# Patient Record
Sex: Female | Born: 2000 | Race: White | Hispanic: No | Marital: Single | Smoking: Current every day smoker
Health system: Southern US, Community
[De-identification: ages and names within clinical notes are randomized; demographics above are authoritative.]

## PROBLEM LIST (undated history)

## (undated) DIAGNOSIS — O139 Gestational [pregnancy-induced] hypertension without significant proteinuria, unspecified trimester: Secondary | ICD-10-CM

## (undated) DIAGNOSIS — O1490 Unspecified pre-eclampsia, unspecified trimester: Secondary | ICD-10-CM

## (undated) DIAGNOSIS — Z789 Other specified health status: Secondary | ICD-10-CM

## (undated) DIAGNOSIS — I10 Essential (primary) hypertension: Secondary | ICD-10-CM

## (undated) DIAGNOSIS — R011 Cardiac murmur, unspecified: Secondary | ICD-10-CM

## (undated) HISTORY — DX: Gestational (pregnancy-induced) hypertension without significant proteinuria, unspecified trimester: O13.9

## (undated) HISTORY — DX: Essential (primary) hypertension: I10

## (undated) HISTORY — PX: NO PAST SURGERIES: SHX2092

## (undated) HISTORY — DX: Other specified health status: Z78.9

---
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
## 2019-04-21 ENCOUNTER — Ambulatory Visit: Payer: BC Managed Care – PPO | Admitting: Adult Health

## 2019-04-28 ENCOUNTER — Telehealth: Payer: Self-pay | Admitting: Adult Health

## 2019-04-28 NOTE — Telephone Encounter (Signed)

## 2019-05-03 ENCOUNTER — Ambulatory Visit (INDEPENDENT_AMBULATORY_CARE_PROVIDER_SITE_OTHER): Payer: BC Managed Care – PPO | Admitting: Adult Health

## 2019-05-03 ENCOUNTER — Ambulatory Visit: Payer: BC Managed Care – PPO | Admitting: Adult Health

## 2019-05-03 ENCOUNTER — Other Ambulatory Visit: Payer: Self-pay

## 2019-05-03 ENCOUNTER — Encounter: Payer: Self-pay | Admitting: Adult Health

## 2019-05-03 VITALS — BP 123/83 | HR 68 | Ht 66.0 in | Wt 112.5 lb

## 2019-05-03 DIAGNOSIS — Z3009 Encounter for other general counseling and advice on contraception: Secondary | ICD-10-CM | POA: Diagnosis not present

## 2019-05-03 DIAGNOSIS — Z3202 Encounter for pregnancy test, result negative: Secondary | ICD-10-CM

## 2019-05-03 HISTORY — DX: Encounter for other general counseling and advice on contraception: Z30.09

## 2019-05-03 LAB — POCT URINE PREGNANCY: Preg Test, Ur: NEGATIVE

## 2019-05-03 NOTE — Progress Notes (Signed)
  Subjective:     Patient ID: Sarah Campbell, female   DOB: [birth year], 19 y.o.   MRN: [id number]  HPI Sarah Campbell is a 19 year old white female,single, G0P0 in to discuss birth control options PCP is [facility].   Review of Systems Stopped loestrin had clots and painful period, was in [place] a while and pills change while there.  Reviewed past medical,surgical, social and family history. Reviewed medications and allergies.     Objective:   Physical Exam  BP 123/83 (BP Location: Left Arm, Patient Position: Sitting, Cuff Size: Normal)   Pulse 68   Ht 5\' 6"  (1.676 m)   Wt 112 lb 8 oz (51 kg)   LMP 04/18/2019   BMI 18.16 kg/m UPT is negative Skin warm and dry. Lungs: clear to ausculation bilaterally. Cardiovascular: regular rate and rhythm.    Assessment:     1. Pregnancy examination or test, negative result  2. General counseling and advice on contraceptive management Discussed birth control options and she wants to try nexplanon ,she is aware of possible side effects Review handout on nexplanon No sex Return 4/23 for inxpalnon insertion, while on period     Plan:     Return 4/23 for nexplanon insertion  No sex

## 2020-03-07 ENCOUNTER — Encounter: Payer: Self-pay | Admitting: Adult Health

## 2020-03-07 ENCOUNTER — Ambulatory Visit (INDEPENDENT_AMBULATORY_CARE_PROVIDER_SITE_OTHER): Payer: BC Managed Care – PPO | Admitting: Adult Health

## 2020-03-07 ENCOUNTER — Other Ambulatory Visit: Payer: Self-pay

## 2020-03-07 ENCOUNTER — Other Ambulatory Visit (HOSPITAL_COMMUNITY)
Admission: RE | Admit: 2020-03-07 | Discharge: 2020-03-07 | Disposition: A | Discharge status: Home / Self Care | Payer: BC Managed Care – PPO | Source: Ambulatory Visit | Attending: Adult Health | Admitting: Adult Health

## 2020-03-07 VITALS — BP 117/76 | HR 81 | Ht 66.0 in | Wt 120.0 lb

## 2020-03-07 DIAGNOSIS — Z113 Encounter for screening for infections with a predominantly sexual mode of transmission: Secondary | ICD-10-CM | POA: Insufficient documentation

## 2020-03-07 DIAGNOSIS — Z30013 Encounter for initial prescription of injectable contraceptive: Secondary | ICD-10-CM | POA: Diagnosis not present

## 2020-03-07 DIAGNOSIS — Z3202 Encounter for pregnancy test, result negative: Secondary | ICD-10-CM | POA: Diagnosis not present

## 2020-03-07 HISTORY — DX: Encounter for screening for infections with a predominantly sexual mode of transmission: Z11.3

## 2020-03-07 LAB — POCT URINE PREGNANCY: Preg Test, Ur: NEGATIVE

## 2020-03-07 MED ORDER — MEDROXYPROGESTERONE ACETATE 150 MG/ML IM SUSP: 150.0000 mg | INTRAMUSCULAR | 4 refills | Status: DC

## 2020-03-07 MED ORDER — MEDROXYPROGESTERONE ACETATE 150 MG/ML IM SUSP
150.0000 mg | Freq: Once | INTRAMUSCULAR | Status: AC
  Administered 2020-03-07: 150 mg via INTRAMUSCULAR
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
## 2020-03-08 ENCOUNTER — Telehealth: Payer: Self-pay | Admitting: Family Medicine

## 2020-03-08 ENCOUNTER — Telehealth: Payer: Self-pay | Admitting: *Deleted

## 2020-03-08 LAB — CERVICOVAGINAL ANCILLARY ONLY
Bacterial Vaginitis (gardnerella): POSITIVE — AB
Candida Glabrata: NEGATIVE
Candida Vaginitis: NEGATIVE
Chlamydia: NEGATIVE
Comment: NEGATIVE
Comment: NEGATIVE
Comment: NEGATIVE
Comment: NEGATIVE
Comment: NEGATIVE
Comment: NORMAL
Neisseria Gonorrhea: NEGATIVE
Trichomonas: NEGATIVE

## 2020-03-08 LAB — HEPATITIS C ANTIBODY: Hep C Virus Ab: <0.1 s/co ratio (ref 0.0–0.9)

## 2020-03-08 LAB — RPR: RPR Ser Ql: NONREACTIVE

## 2020-03-08 LAB — HIV ANTIBODY (ROUTINE TESTING W REFLEX): HIV Screen 4th Generation wRfx: NONREACTIVE

## 2020-03-08 NOTE — Telephone Encounter (Signed)
Pt received pfizer vaccine 1st dose 01-18-2020 and 2nd dose on 03-02-2020. Pt does not want to sch for booster

## 2020-03-08 NOTE — Telephone Encounter (Signed)
-----   Message from [name], NP sent at 03/08/2020  8:40 AM EST ----- Let her know labs negative... THX

## 2020-03-08 NOTE — Telephone Encounter (Signed)
Pt aware labs were negative. Pt voiced understanding. JSY

## 2020-03-09 ENCOUNTER — Telehealth: Payer: Self-pay | Admitting: Adult Health

## 2020-03-09 MED ORDER — METRONIDAZOLE 500 MG PO TABS: 500.0000 mg | ORAL_TABLET | Freq: Two times a day (BID) | ORAL | 0 refills | Status: DC

## 2020-03-09 NOTE — Telephone Encounter (Signed)
Pt aware that vaginal swab +BV will rx flagyl, no alcohol or sex

## 2020-04-12 DIAGNOSIS — R300 Dysuria: Secondary | ICD-10-CM | POA: Diagnosis not present

## 2020-04-12 DIAGNOSIS — R109 Unspecified abdominal pain: Secondary | ICD-10-CM | POA: Diagnosis not present

## 2020-04-12 DIAGNOSIS — M545 Low back pain, unspecified: Secondary | ICD-10-CM | POA: Diagnosis not present

## 2020-04-14 ENCOUNTER — Telehealth: Payer: Self-pay

## 2020-04-14 NOTE — Telephone Encounter (Signed)
Transition Care Management Follow-up Telephone Call  Date of discharge and from where: 04/13/2020 from   How have you been since you were released from the hospital? Pt states that she is beginning to feel better.   Any questions or concerns? No  Items Reviewed:  Did the pt receive and understand the discharge instructions provided? Yes   Medications obtained and verified? Yes   Other? No   Any new allergies since your discharge? No   Dietary orders reviewed? n/a  Do you have support at home? Yes   Functional Questionnaire: (I = Independent and D = Dependent) ADLs: I  Bathing/Dressing- I  Meal Prep- I  Eating- I  Maintaining continence- I  Transferring/Ambulation- I  Managing Meds- I   Follow up appointments reviewed:   PCP Hospital f/u appt confirmed? No    Specialist Hospital f/u appt confirmed? No    Are transportation arrangements needed? No   If their condition worsens, is the pt aware to call PCP or go to the Emergency Dept.? Yes  Was the patient provided with contact information for the PCP's office or ED? Yes  Was to pt encouraged to call back with questions or concerns? Yes

## 2020-05-30 ENCOUNTER — Encounter: Payer: Self-pay | Admitting: Adult Health

## 2020-05-30 ENCOUNTER — Other Ambulatory Visit (HOSPITAL_COMMUNITY)
Admission: RE | Admit: 2020-05-30 | Discharge: 2020-05-30 | Disposition: A | Discharge status: Home / Self Care | Payer: BC Managed Care – PPO | Source: Ambulatory Visit | Attending: Adult Health | Admitting: Adult Health

## 2020-05-30 ENCOUNTER — Other Ambulatory Visit: Payer: Self-pay

## 2020-05-30 ENCOUNTER — Ambulatory Visit (INDEPENDENT_AMBULATORY_CARE_PROVIDER_SITE_OTHER): Payer: BC Managed Care – PPO | Admitting: Adult Health

## 2020-05-30 VITALS — BP 125/84 | HR 85 | Ht 66.0 in | Wt 129.0 lb

## 2020-05-30 DIAGNOSIS — R748 Abnormal levels of other serum enzymes: Secondary | ICD-10-CM

## 2020-05-30 DIAGNOSIS — Z01419 Encounter for gynecological examination (general) (routine) without abnormal findings: Secondary | ICD-10-CM

## 2020-05-30 DIAGNOSIS — Z113 Encounter for screening for infections with a predominantly sexual mode of transmission: Secondary | ICD-10-CM | POA: Diagnosis not present

## 2020-05-30 DIAGNOSIS — Z3042 Encounter for surveillance of injectable contraceptive: Secondary | ICD-10-CM | POA: Diagnosis not present

## 2020-05-30 MED ORDER — MEDROXYPROGESTERONE ACETATE 150 MG/ML IM SUSP
150.0000 mg | Freq: Once | INTRAMUSCULAR | Status: AC
  Administered 2020-05-30: 150 mg via INTRAMUSCULAR

## 2020-05-30 MED ORDER — MEDROXYPROGESTERONE ACETATE 150 MG/ML IM SUSP: 150.0000 mg | Freq: Once | INTRAMUSCULAR | Status: DC

## 2020-05-30 MED ORDER — MEDROXYPROGESTERONE ACETATE 150 MG/ML IM SUSP: 150.0000 mg | INTRAMUSCULAR | 4 refills | Status: DC

## 2020-05-30 NOTE — Addendum Note (Signed)
Addended by: Moss Mc on: 05/30/2020 11:55 AM   Modules accepted: Orders

## 2020-05-30 NOTE — Progress Notes (Signed)
Patient ID: Sarah Campbell, female   DOB: [birth year], 20 y.o.   MRN: [id number] History of Present Illness: Sarah Campbell is a 20 year old white female,single, G0P0, in for depo, and well woman gyn exam and first pap. 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 She was seen in ER at [facility] and lipase was 119 on 04/12/20. PCP is Dr Lilyan Punt, has not seen lately.   Current Medications, Allergies, Past Medical History, Past Surgical History, Family History and Social History were reviewed in [employer] record.     Review of Systems: Patient denies any headaches, hearing loss, fatigue, blurred vision, shortness of breath, chest pain, abdominal pain, problems with bowel movements, urination, or intercourse. No joint pain or mood swings. Has new partner,did use condom.   Physical Exam:BP 125/84 (BP Location: Left Arm, Patient Position: Sitting, Cuff Size: Normal)   Pulse 85   Ht 5\' 6"  (1.676 m)   Wt 129 lb (58.5 kg)   LMP 05/28/2020   BMI 20.82 kg/m  General:  Well developed, well nourished, no acute distress Skin:  Warm and dry Neck:  Midline trachea, normal thyroid, good ROM, no lymphadenopathy Lungs; Clear to auscultation bilaterally Breast:  No dominant palpable mass, retraction, or nipple discharge Cardiovascular: Regular rate and rhythm Abdomen:  Soft, non tender, no hepatosplenomegaly Pelvic:  External genitalia is normal in appearance, no lesions.  The vagina is normal in appearance. Urethra has no lesions or masses. The cervix is smooth and nulliparous, pap with GC/CHL performed.  Uterus is felt to be normal size, shape, and contour.  No adnexal masses or tenderness noted.Bladder is non tender, no masses felt. Extremities/musculoskeletal:  No swelling or varicosities noted, no clubbing or cyanosis Psych:  No mood changes, alert and cooperative,seems happy AA is 2 Fall risk is low PHQ 9 score is 2 GAD 7 score is 1  Upstream - 05/30/20 1039      Pregnancy Intention  Screening   Does the patient want to become pregnant in the next year? No    Does the patient's partner want to become pregnant in the next year? No    Would the patient like to discuss contraceptive options today? No      Contraception Wrap Up   Current Method Hormonal Injection    End Method Hormonal Injection    Contraception Counseling Provided No         Examination chaperoned by 07/30/20.  Impression and Plan:  1. Depo-Provera contraceptive status Depo given in office today - medroxyPROGESTERone (DEPO-PROVERA) injection 150 mg   2. Encounter for gynecological examination with Papanicolaou smear of cervix Pap sent Physical in 1 year Pap in 3 if normal  3. Encounter for surveillance of injectable contraceptive Refilled depo Meds ordered this encounter  Medications  . medroxyPROGESTERone (DEPO-PROVERA) injection 150 mg  . medroxyPROGESTERone (DEPO-PROVERA) injection 150 mg  . medroxyPROGESTERone (DEPO-PROVERA) 150 MG/ML injection    Sig: Inject 1 mL (150 mg total) into the muscle every 3 (three) months.    Dispense:  1 mL    Refill:  4    Order Specific Question:   Supervising Provider    Answer:   [employer], LUTHER H [[id number]]    4. Elevated lipase Will recheck lipase - Lipase  5. Screening examination for STD (sexually transmitted disease) Will check HIV,RPR and HBSA - RPR - HIV Antibody (routine testing w rflx) - Hepatitis B surface antigen

## 2020-05-31 ENCOUNTER — Telehealth: Payer: Self-pay | Admitting: *Deleted

## 2020-05-31 LAB — CYTOLOGY - PAP
Chlamydia: NEGATIVE
Comment: NEGATIVE
Comment: NORMAL
Diagnosis: NEGATIVE
Neisseria Gonorrhea: NEGATIVE

## 2020-05-31 LAB — HIV ANTIBODY (ROUTINE TESTING W REFLEX): HIV Screen 4th Generation wRfx: NONREACTIVE

## 2020-05-31 LAB — LIPASE: Lipase: 37 U/L (ref 14–72)

## 2020-05-31 LAB — RPR: RPR Ser Ql: NONREACTIVE

## 2020-05-31 LAB — HEPATITIS B SURFACE ANTIGEN: Hepatitis B Surface Ag: NEGATIVE

## 2020-05-31 NOTE — Telephone Encounter (Signed)
-----   Message from [name], NP sent at 05/31/2020  9:48 AM EDT ----- Let her know lipase back to normal

## 2020-05-31 NOTE — Telephone Encounter (Signed)
Pt aware her labs are negative and lipase is back to normal. Pap still pending. Pt voiced understanding. JSY

## 2020-05-31 NOTE — Telephone Encounter (Signed)
Returned your phone call

## 2020-05-31 NOTE — Telephone Encounter (Signed)
-----   Message from [name], NP sent at 05/31/2020  8:37 AM EDT ----- Please let pt know labs negative

## 2020-05-31 NOTE — Telephone Encounter (Signed)
Voice mail not set up @ 10:32 am. I called alternate # and left message with a female to have pt return my call. JSY

## 2020-06-26 ENCOUNTER — Ambulatory Visit
Admission: EM | Admit: 2020-06-26 | Discharge: 2020-06-26 | Disposition: A | Discharge status: Home / Self Care | Payer: BC Managed Care – PPO | Attending: Family Medicine | Admitting: Family Medicine

## 2020-06-26 ENCOUNTER — Other Ambulatory Visit: Payer: Self-pay

## 2020-06-26 ENCOUNTER — Encounter: Payer: Self-pay | Admitting: Emergency Medicine

## 2020-06-26 DIAGNOSIS — Z1152 Encounter for screening for COVID-19: Secondary | ICD-10-CM | POA: Diagnosis not present

## 2020-06-26 DIAGNOSIS — H60392 Other infective otitis externa, left ear: Secondary | ICD-10-CM

## 2020-06-26 DIAGNOSIS — J069 Acute upper respiratory infection, unspecified: Secondary | ICD-10-CM

## 2020-06-26 MED ORDER — IPRATROPIUM BROMIDE 0.03 % NA SOLN: 2.0000 | Freq: Two times a day (BID) | NASAL | 0 refills | Status: DC | PRN

## 2020-06-26 MED ORDER — BENZONATATE 100 MG PO CAPS: 200.0000 mg | ORAL_CAPSULE | Freq: Three times a day (TID) | ORAL | 0 refills | Status: DC | PRN

## 2020-06-26 MED ORDER — NEOMYCIN-POLYMYXIN-HC 3.5-10000-1 OT SUSP: 3.0000 [drp] | Freq: Three times a day (TID) | OTIC | 0 refills | Status: DC

## 2020-06-26 NOTE — ED Triage Notes (Signed)
Sinus congestion, sore throat, LT ear pain, nausea and diarrhea.  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

## 2020-06-26 NOTE — ED Provider Notes (Signed)
[facility]    CSN: [id number] Arrival date & time: 06/26/20  1031      History   Chief Complaint No chief complaint on file.   HPI Sarah Campbell is a 20 y.o. female.   HPI Patient presents with URI symptoms including cough, sore throat, otalgia, nasal congestion, and sinus pressure. Unknown of COVID exposure,  However works around COVID-positive patients.  Afebrile and denies worrisome symptoms of shortness of breath, weakness, or N&V.  History reviewed. No pertinent past medical history.  Patient Active Problem List   Diagnosis Date Noted  . Encounter for gynecological examination with Papanicolaou smear of cervix 05/30/2020  . Depo-Provera contraceptive status 05/30/2020  . Encounter for surveillance of injectable contraceptive 05/30/2020  . Elevated lipase 05/30/2020  . Screening examination for STD (sexually transmitted disease) 03/07/2020  . Encounter for initial prescription of injectable contraceptive 03/07/2020  . General counseling and advice on contraceptive management 05/03/2019  . Pregnancy examination or test, negative result 05/03/2019    History reviewed. No pertinent surgical history.  OB History    Gravida  0   Para  0   Term  0   Preterm  0   AB  0   Living  0     SAB  0   IAB  0   Ectopic  0   Multiple  0   Live Births  0            Home Medications    Prior to Admission medications   Medication Sig Start Date End Date Taking? Authorizing Provider  Fexofenadine HCl (ALLEGRA PO) Take by mouth as needed.    [provider]  medroxyPROGESTERone (DEPO-PROVERA) 150 MG/ML injection Inject 1 mL (150 mg total) into the muscle every 3 (three) months. 05/30/20   [name], NP  omeprazole (PRILOSEC) 20 MG capsule Take 1 capsule (20 mg total) by mouth 2 (two) times daily. 06/24/18 08/10/18  Rennis Harding, PA-C    Family History Family History  Problem Relation Age of Onset  . Heart attack Paternal  Grandmother   . Diabetes Maternal Grandfather   . Heart attack Maternal Grandfather   . Alcoholism Father     Social History Social History   Tobacco Use  . Smoking status: Current Every Day Smoker    Types: E-cigarettes  . Smokeless tobacco: Never Used  Vaping Use  . Vaping Use: Every day  Substance Use Topics  . Alcohol use: No  . Drug use: No     Allergies   Patient has no known allergies.   Review of Systems Review of Systems Pertinent negatives listed in HPI   Physical Exam Triage Vital Signs ED Triage Vitals  Enc Vitals Group     BP 06/26/20 1103 110/72     Pulse Rate 06/26/20 1103 74     Resp 06/26/20 1103 17     Temp 06/26/20 1103 98.4 F (36.9 C)     Temp Source 06/26/20 1103 Oral     SpO2 06/26/20 1103 99 %     Weight --      Height --      Head Circumference --      Peak Flow --      Pain Score 06/26/20 1102 0     Pain Loc --      Pain Edu? --      Excl. in GC? --    No data found.  Updated Vital Signs BP 110/72 (BP  Location: Right Arm)   Pulse 74   Temp 98.4 F (36.9 C) (Oral)   Resp 17   LMP 05/28/2020   SpO2 99%   Visual Acuity Right Eye Distance:   Left Eye Distance:   Bilateral Distance:    Right Eye Near:   Left Eye Near:    Bilateral Near:     Physical Exam  General Appearance:    Alert, cooperative, no distress  HENT:   Normocephalic, ears normal, nares mucosal edema with congestion, rhinorrhea, oropharynx mild erythema with exudate or swelling     Eyes:    PERRL, conjunctiva/corneas clear, EOM's intact       Lungs:     Clear to auscultation bilaterally, respirations unlabored  Heart:    Regular rate and rhythm  Neurologic:   Awake, alert, oriented x 3. No apparent focal neurological           defect.     UC Treatments / Results  Labs (all labs ordered are listed, but only abnormal results are displayed) Labs Reviewed  NOVEL CORONAVIRUS, NAA    EKG   Radiology No results found.  Procedures Procedures  (including critical care time)  Medications Ordered in UC Medications - No data to display  Initial Impression / Assessment and Plan / UC Course  I have reviewed the triage vital signs and the nursing notes.  Pertinent labs & imaging results that were available during my care of the patient were reviewed by me and considered in my medical decision making (see chart for details).    COVID/Flu test pending. Symptom management warranted only.  Manage fever with Tylenol and ibuprofen.  Nasal symptoms with over-the-counter antihistamines recommended.  Treatment per discharge medications/discharge instructions.  Red flags/ER precautions given. The most current CDC isolation/quarantine recommendation advised.  Final Clinical Impressions(s) / UC Diagnoses   Final diagnoses:  Encounter for screening for COVID-19  Viral URI with cough  Acute infective otitis externa of left ear     Discharge Instructions     Your COVID 19 results should result within 3-4 days. Negative results are immediately resulted to Mychart. Only Positive results will receive a follow-up call from our clinic. If symptoms are present, I recommend home quarantine until results are known.  Alternate Tylenol and ibuprofen as needed for body aches and fever.  Symptom management per recommendations discussed today.  If any breathing difficulty or chest pain develops go immediately to the closest emergency department for evaluation.     ED Prescriptions    Medication Sig Dispense Auth. Provider   neomycin-polymyxin-hydrocortisone (CORTISPORIN) 3.5-10000-1 OTIC suspension Place 3 drops into the left ear 3 (three) times daily. 10 mL Bing Neighbors, FNP   ipratropium (ATROVENT) 0.03 % nasal spray Place 2 sprays into both nostrils 2 (two) times daily as needed for rhinitis. 30 mL Bing Neighbors, FNP   benzonatate (TESSALON) 100 MG capsule Take 2 capsules (200 mg total) by mouth 3 (three) times daily as needed for cough. 40  capsule Bing Neighbors, FNP     PDMP not reviewed this encounter.   Bing Neighbors, FNP 06/26/20 1200

## 2020-06-26 NOTE — Discharge Instructions (Addendum)
Your COVID 19 results should result within 3-4 days. Negative results are immediately resulted to Mychart. Only Positive results will receive a follow-up call from our clinic. If symptoms are present, I recommend home quarantine until results are known.  Alternate Tylenol and ibuprofen as needed for body aches and fever.  Symptom management per recommendations discussed today.  If any breathing difficulty or chest pain develops go immediately to the closest emergency department for evaluation.

## 2020-06-28 LAB — NOVEL CORONAVIRUS, NAA: SARS-CoV-2, NAA: NOT DETECTED

## 2020-06-28 LAB — SARS-COV-2, NAA 2 DAY TAT

## 2020-06-30 ENCOUNTER — Emergency Department (HOSPITAL_COMMUNITY): Payer: BC Managed Care – PPO

## 2020-06-30 ENCOUNTER — Encounter (HOSPITAL_COMMUNITY): Payer: Self-pay | Admitting: Emergency Medicine

## 2020-06-30 ENCOUNTER — Other Ambulatory Visit: Payer: Self-pay

## 2020-06-30 ENCOUNTER — Emergency Department (HOSPITAL_COMMUNITY)
Admission: EM | Admit: 2020-06-30 | Discharge: 2020-06-30 | Disposition: A | Discharge status: Home / Self Care | Payer: BC Managed Care – PPO | Attending: Emergency Medicine | Admitting: Emergency Medicine

## 2020-06-30 DIAGNOSIS — F17290 Nicotine dependence, other tobacco product, uncomplicated: Secondary | ICD-10-CM | POA: Diagnosis not present

## 2020-06-30 DIAGNOSIS — R509 Fever, unspecified: Secondary | ICD-10-CM | POA: Insufficient documentation

## 2020-06-30 DIAGNOSIS — M791 Myalgia, unspecified site: Secondary | ICD-10-CM | POA: Insufficient documentation

## 2020-06-30 DIAGNOSIS — Z20822 Contact with and (suspected) exposure to covid-19: Secondary | ICD-10-CM | POA: Insufficient documentation

## 2020-06-30 DIAGNOSIS — R0789 Other chest pain: Secondary | ICD-10-CM | POA: Insufficient documentation

## 2020-06-30 DIAGNOSIS — R059 Cough, unspecified: Secondary | ICD-10-CM | POA: Diagnosis present

## 2020-06-30 LAB — SARS CORONAVIRUS 2 (TAT 6-24 HRS): SARS Coronavirus 2: NEGATIVE

## 2020-06-30 IMAGING — DX DG CHEST 1V PORT
1 series · 1 of 1 positions shown · non-contrast
Comparison: None.

CLINICAL DATA: Cough.

EXAM:
PORTABLE CHEST 1 VIEW

[chest ap]
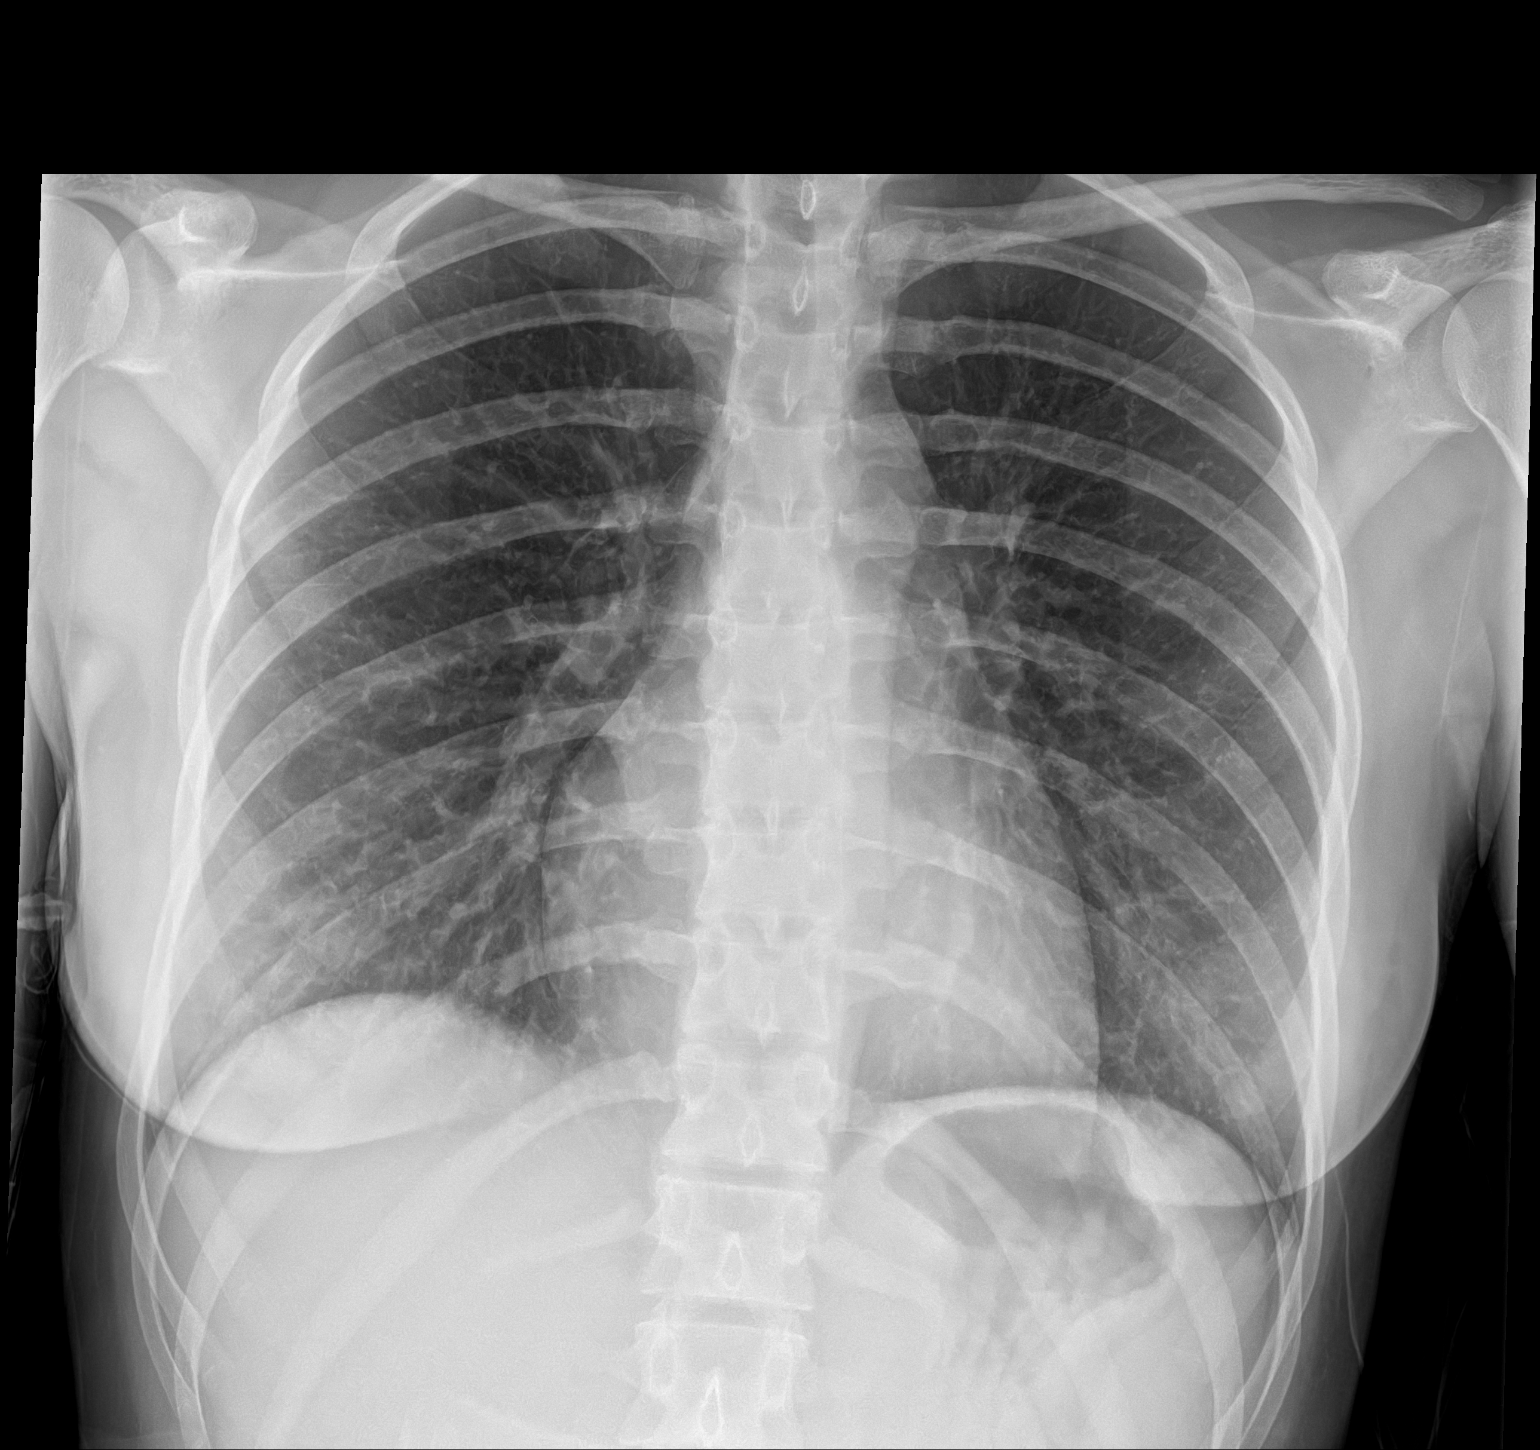

[1 of 1 positions shown; findings below may reference images not displayed]

FINDINGS: The heart size and mediastinal contours are within normal limits.
Both lungs are clear. The visualized skeletal structures are
unremarkable.
IMPRESSION: No active disease.

## 2020-06-30 MED ORDER — PROMETHAZINE-DM 6.25-15 MG/5ML PO SYRP: 5.0000 mL | ORAL_SOLUTION | Freq: Four times a day (QID) | ORAL | 0 refills | Status: DC | PRN

## 2020-06-30 MED ORDER — AZITHROMYCIN 250 MG PO TABS: ORAL_TABLET | ORAL | 0 refills | Status: DC

## 2020-06-30 MED ORDER — ALBUTEROL SULFATE HFA 108 (90 BASE) MCG/ACT IN AERS
3.0000 | INHALATION_SPRAY | Freq: Once | RESPIRATORY_TRACT | Status: AC
  Administered 2020-06-30: 3 via RESPIRATORY_TRACT
  Filled 2020-06-30: qty 6.7

## 2020-06-30 NOTE — ED Triage Notes (Addendum)
Seen at Urgent care on 06/26/20 and treated.  Here today due to continued non-productive cough.  Test negative for COVID on 06/24/20 and 06/26/20 both through [facility].

## 2020-06-30 NOTE — Discharge Instructions (Signed)
Your chest x-ray today did not show evidence of pneumonia.  Stop the Tessalon Perls and start the prescription promethazine syrup for your cough.  Use 1 to 2 puffs of the albuterol inhaler every 4-6 hours as needed for shortness of breath and/or wheezing.  You may continue Tylenol if needed for body aches or fever.  Your repeat COVID test is pending.  You may review your results on the MyChart app.  As discussed, your blood pressure today is elevated.  This may be related to your illness.  I recommend that you check your blood pressure daily and make a log of your readings and follow-up with your primary care provider next week.  Return to the emergency department for any new or worsening symptoms.

## 2020-06-30 NOTE — ED Provider Notes (Signed)
[name] EMERGENCY DEPARTMENT Provider Note   CSN: [id number] Arrival date & time: 06/30/20  0945     History Chief Complaint  Patient presents with  . Cough    Non productive cough.  Last test COVID on Wednesday, resulted negative.      Sarah Campbell is a 20 y.o. female.  HPI      Sarah Campbell is a 20 y.o. female who presents to the Emergency Department complaining of persistent cough and concerns for possible COVID.  States she developed fever, cough and generalized body aches last week.  She had PCR testing for COVID on 06/24/2020 and was negative her symptoms did not improve and she was retested with a antigen test on 06/26/2020 that was also negative.  She is here today due to concerns of persistent cough that is described as nonproductive.  She describes having discomfort through her lower chest associated with cough.  She no longer has fevers.  She denies chills, vomiting, or diarrhea. She has been taking Tessalon and Atrovent nasal spray without relief.  No OTC decongestants.  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    History reviewed. No pertinent past medical history.  Patient Active Problem List   Diagnosis Date Noted  . Encounter for gynecological examination with Papanicolaou smear of cervix 05/30/2020  . Depo-Provera contraceptive status 05/30/2020  . Encounter for surveillance of injectable contraceptive 05/30/2020  . Elevated lipase 05/30/2020  . Screening examination for STD (sexually transmitted disease) 03/07/2020  . Encounter for initial prescription of injectable contraceptive 03/07/2020  . General counseling and advice on contraceptive management 05/03/2019  . Pregnancy examination or test, negative result 05/03/2019    History reviewed. No pertinent surgical history.   OB History    Gravida  0   Para  0   Term  0   Preterm  0   AB  0   Living  0     SAB  0   IAB  0   Ectopic  0   Multiple  0    Live Births  0           Family History  Problem Relation Age of Onset  . Heart attack Paternal Grandmother   . Diabetes Maternal Grandfather   . Heart attack Maternal Grandfather   . Alcoholism Father     Social History   Tobacco Use  . Smoking status: Current Every Day Smoker    Types: E-cigarettes  . Smokeless tobacco: Never Used  Vaping Use  . Vaping Use: Every day  Substance Use Topics  . Alcohol use: No  . Drug use: No    Home Medications Prior to Admission medications   Medication Sig Start Date End Date Taking? Authorizing Provider  benzonatate (TESSALON) 100 MG capsule Take 2 capsules (200 mg total) by mouth 3 (three) times daily as needed for cough. 06/26/20   Bing Neighbors, FNP  Fexofenadine HCl (ALLEGRA PO) Take by mouth as needed.    [provider]  ipratropium (ATROVENT) 0.03 % nasal spray Place 2 sprays into both nostrils 2 (two) times daily as needed for rhinitis. 06/26/20   Bing Neighbors, FNP  medroxyPROGESTERone (DEPO-PROVERA) 150 MG/ML injection Inject 1 mL (150 mg total) into the muscle every 3 (three) months. 05/30/20   [name], NP  neomycin-polymyxin-hydrocortisone (CORTISPORIN) 3.5-10000-1 OTIC suspension Place 3 drops into the left ear 3 (three) times daily. 06/26/20   Bing Neighbors, FNP  omeprazole (PRILOSEC) 20 MG capsule Take 1 capsule (20 mg total) by mouth 2 (two) times daily. 06/24/18 08/10/18  Rennis Harding, PA-C    Allergies    Patient has no known allergies.  Review of Systems   Review of Systems  Constitutional: Positive for fever. Negative for chills and fatigue.  HENT: Negative for sore throat and trouble swallowing.   Respiratory: Positive for cough. Negative for shortness of breath and wheezing.   Cardiovascular: Negative for chest pain and palpitations.  Gastrointestinal: Negative for abdominal pain, blood in stool, nausea and vomiting.  Genitourinary: Negative for dysuria, flank pain and  hematuria.  Musculoskeletal: Positive for myalgias. Negative for arthralgias, back pain, neck pain and neck stiffness.  Skin: Negative for rash.  Neurological: Negative for dizziness, weakness, numbness and headaches.  Hematological: Does not bruise/bleed easily.    Physical Exam Updated Vital Signs BP (!) 127/102 (BP Location: Right Arm)   Pulse 88   Temp 98.7 F (37.1 C) (Oral)   Resp 16   Ht 5\' 6"  (1.676 m)   Wt 61.2 kg   SpO2 99%   BMI 21.79 kg/m   Physical Exam Vitals and nursing note reviewed.  Constitutional:      Appearance: Normal appearance. She is not ill-appearing.  HENT:     Right Ear: Tympanic membrane and ear canal normal.     Left Ear: Tympanic membrane and ear canal normal.     Mouth/Throat:     Mouth: Mucous membranes are moist.  Eyes:     Conjunctiva/sclera: Conjunctivae normal.  Cardiovascular:     Rate and Rhythm: Normal rate and regular rhythm.     Pulses: Normal pulses.  Pulmonary:     Effort: Pulmonary effort is normal.  Abdominal:     Palpations: Abdomen is soft.     Tenderness: There is no abdominal tenderness.  Musculoskeletal:        General: Normal range of motion.     Right lower leg: No edema.     Left lower leg: No edema.  Skin:    General: Skin is warm.     Capillary Refill: Capillary refill takes less than 2 seconds.  Neurological:     General: No focal deficit present.     Mental Status: She is alert.     Sensory: No sensory deficit.     Motor: No weakness.     ED Results / Procedures / Treatments   Labs (all labs ordered are listed, but only abnormal results are displayed) Labs Reviewed  SARS CORONAVIRUS 2 (TAT 6-24 HRS)    EKG None  Radiology DG Chest Portable 1 View  Result Date: 06/30/2020 CLINICAL DATA:  Cough. EXAM: PORTABLE CHEST 1 VIEW COMPARISON:  None. FINDINGS: The heart size and mediastinal contours are within normal limits. Both lungs are clear. The visualized skeletal structures are unremarkable.  IMPRESSION: No active disease. Electronically Signed   By: 08/30/2020 M.D.   On: 06/30/2020 11:03    Procedures Procedures   Medications Ordered in ED Medications  albuterol (VENTOLIN HFA) 108 (90 Base) MCG/ACT inhaler 3 puff (3 puffs Inhalation Given 06/30/20 1047)    ED Course  I have reviewed the triage vital signs and the nursing notes.  Pertinent labs & imaging results that were available during my care of the patient were reviewed by me and considered in my medical decision making (see chart for details).    MDM Rules/Calculators/A&P  Patient seen here for continued cough.  She is employed in the healthcare field.  She had a PCR COVID test on 06/24/2020 and a rapid test on 06/26/2020 both of which were negative.  Fever x2 days at onset, none since.  Denies chest pain or shortness of breath.  No abdominal pain vomiting or diarrhea.  On exam, she is well-appearing, nontoxic no tachycardia or tachypnea.  PERC negative.  She is hypertensive and admits to history of hypertension with previous illnesses, not currently taking decongestants.  She continues to have concern about her Covid status, so will repeat her test.  Reports frequent bronchitis.  CXR today is reassuring.  Will treat her symptoms.  She has not been prescribed antihypertensive medication.  She does have primary care provider and is agreeable to monitor her blood pressures daily and to follow-up closely with her PCP    Jeselle L Casavant was evaluated in Emergency Department on 06/30/2020 for the symptoms described in the history of present illness. She was evaluated in the context of the global COVID-19 pandemic, which necessitated consideration that the patient might be at risk for infection with the SARS-CoV-2 virus that causes COVID-19. Institutional protocols and algorithms that pertain to the evaluation of patients at risk for COVID-19 are in a state of rapid change based on information released by  regulatory bodies including the CDC and federal and state organizations. These policies and algorithms were followed during the patient's care in the ED.  Final Clinical Impression(s) / ED Diagnoses Final diagnoses:  Cough    Rx / DC Orders ED Discharge Orders    None       Pauline Aus, PA-C 07/01/20 1101    Maia Plan, MD 07/03/20 1635

## 2020-07-26 ENCOUNTER — Ambulatory Visit: Payer: BC Managed Care – PPO | Admitting: Advanced Practice Midwife

## 2020-07-26 ENCOUNTER — Other Ambulatory Visit: Payer: Self-pay

## 2020-07-26 ENCOUNTER — Encounter: Payer: Self-pay | Admitting: Advanced Practice Midwife

## 2020-07-26 VITALS — BP 112/78 | HR 111 | Ht 66.0 in | Wt 131.0 lb

## 2020-07-26 DIAGNOSIS — N920 Excessive and frequent menstruation with regular cycle: Secondary | ICD-10-CM | POA: Diagnosis not present

## 2020-07-26 DIAGNOSIS — N921 Excessive and frequent menstruation with irregular cycle: Secondary | ICD-10-CM | POA: Diagnosis not present

## 2020-07-26 LAB — POCT HEMOGLOBIN: Hemoglobin: 12.7 g/dL (ref 11–14.6)

## 2020-07-26 MED ORDER — NAPROXEN 500 MG PO TABS: 500.0000 mg | ORAL_TABLET | Freq: Two times a day (BID) | ORAL | 0 refills | Status: DC

## 2020-07-26 MED ORDER — MEGESTROL ACETATE 40 MG PO TABS: 40.0000 mg | ORAL_TABLET | Freq: Every day | ORAL | 3 refills | Status: DC

## 2020-07-26 NOTE — Progress Notes (Signed)
GYN VISIT Patient name: Sarah Campbell MRN [id number]  Date of birth: 07/13/2000 Chief Complaint:   Menstrual Problem  History of Present Illness:   Sarah Campbell is a 20 y.o. G2P0000 Caucasian female being seen today for vag bleeding x 3wks on DMPA since Feb 2022 (2 injections); using pads/tampons; no s/s anemia.    Patient's last menstrual period was 07/09/2020. The current method of family planning is Depo-Provera injections.  Last pap May 2022. Results were: NILM w/ HRHPV not done  Depression screen [employer] 2/9 05/30/2020 12/31/2017 10/15/2017  Decreased Interest 0 0 0  Down, Depressed, Hopeless 0 0 0  PHQ - 2 Score 0 0 0  Altered sleeping 1 - -  Tired, decreased energy 1 - -  Change in appetite 0 - -  Feeling bad or failure about yourself  0 - -  Trouble concentrating 0 - -  Moving slowly or fidgety/restless 0 - -  Suicidal thoughts 0 - -  PHQ-9 Score 2 - -     GAD 7 : Generalized Anxiety Score 05/30/2020  Nervous, Anxious, on Edge 0  Control/stop worrying 0  Worry too much - different things 0  Trouble relaxing 1  Restless 0  Easily annoyed or irritable 0  Afraid - awful might happen 0  Total GAD 7 Score 1     Review of Systems:   Pertinent items are noted in HPI Denies fever/chills, dizziness, headaches, visual disturbances, fatigue, shortness of breath, chest pain, abdominal pain, vomiting, abnormal vaginal discharge/itching/odor/irritation, problems with periods, bowel movements, urination, or intercourse unless otherwise stated above.  Pertinent History Reviewed:  Reviewed past medical,surgical, social, obstetrical and family history.  Reviewed problem list, medications and allergies. Physical Assessment:   Vitals:   07/26/20 1534  BP: 112/78  Pulse: (!) 111  Weight: 131 lb (59.4 kg)  Height: 5\' 6"  (1.676 m)  Body mass index is 21.14 kg/m.       Physical Examination:   General appearance: alert, well appearing, and in no distress  Mental status: alert,  oriented to person, place, and time  Skin: warm & dry   Cardiovascular: normal heart rate noted  Respiratory: normal respiratory effort, no distress  Abdomen: soft, non-tender   Pelvic: examination not indicated  Extremities: no edema   Chaperone: N/A    Results for orders placed or performed in visit on 07/26/20 (from the past 24 hour(s))  POCT hemoglobin   Collection Time: 07/26/20  3:43 PM  Result Value Ref Range   Hemoglobin 12.7 11 - 14.6 g/dL    Assessment & Plan:  1) AUB on DMPA> rx Megace course with refills; scheduled for next Depo in August> will make an appt if she doesn't want to stay on Depo and we can discuss other options   Meds:  Meds ordered this encounter  Medications   megestrol (MEGACE) 40 MG tablet    Sig: Take 1 tablet (40 mg total) by mouth daily. Take 3 at one time x 5d, then 2 at one time x 5d, then 1 qd until bleeding stops.    Dispense:  45 tablet    Refill:  3    Order Specific Question:   Supervising Provider    Answer:   07/28/20, LUTHER H [[id number]]   naproxen (NAPROSYN) 500 MG tablet    Sig: Take 1 tablet (500 mg total) by mouth 2 (two) times daily with a meal.    Dispense:  30 tablet    Refill:  0  Order Specific Question:   Supervising Provider    Answer:   Lazaro Arms [[id number]]    Orders Placed This Encounter  Procedures   POCT hemoglobin    Return for As scheduled.  Arabella Merles CNM 07/26/2020 3:53 PM

## 2020-08-15 ENCOUNTER — Telehealth: Payer: Self-pay | Admitting: Advanced Practice Midwife

## 2020-08-15 ENCOUNTER — Ambulatory Visit: Payer: BC Managed Care – PPO

## 2020-08-15 ENCOUNTER — Other Ambulatory Visit: Payer: Self-pay

## 2020-08-15 NOTE — Telephone Encounter (Signed)
PT CALLED SHE IS NEEDING A REFILL ON HER DEPO SENT TO [facility]

## 2020-08-15 NOTE — Telephone Encounter (Signed)
Pt came to office for Depo injection, but did not have med. She stated that her pharmacy did not have any refills, but the order in the chart shows 4 refills. Called the pharmacy to confirm the refills, which they did. Called pt to inform her that the pharmacy did have refills and she needed to call them and have it filled. Pt placed on hold to be transferred to front to reschedule her appt. Pt hung up before transfer was made. Front office calling pt back to schedule.

## 2020-08-18 ENCOUNTER — Ambulatory Visit: Payer: BC Managed Care – PPO

## 2020-10-12 ENCOUNTER — Encounter: Payer: Self-pay | Admitting: Emergency Medicine

## 2020-10-12 ENCOUNTER — Ambulatory Visit
Admission: EM | Admit: 2020-10-12 | Discharge: 2020-10-12 | Disposition: A | Discharge status: Home / Self Care | Payer: BC Managed Care – PPO | Attending: Emergency Medicine | Admitting: Emergency Medicine

## 2020-10-12 ENCOUNTER — Other Ambulatory Visit: Payer: Self-pay

## 2020-10-12 DIAGNOSIS — R10813 Right lower quadrant abdominal tenderness: Secondary | ICD-10-CM | POA: Diagnosis not present

## 2020-10-12 DIAGNOSIS — E282 Polycystic ovarian syndrome: Secondary | ICD-10-CM | POA: Diagnosis not present

## 2020-10-12 DIAGNOSIS — R1030 Lower abdominal pain, unspecified: Secondary | ICD-10-CM

## 2020-10-12 DIAGNOSIS — N939 Abnormal uterine and vaginal bleeding, unspecified: Secondary | ICD-10-CM | POA: Diagnosis not present

## 2020-10-12 DIAGNOSIS — R0981 Nasal congestion: Secondary | ICD-10-CM

## 2020-10-12 DIAGNOSIS — N946 Dysmenorrhea, unspecified: Secondary | ICD-10-CM | POA: Diagnosis not present

## 2020-10-12 DIAGNOSIS — R1032 Left lower quadrant pain: Secondary | ICD-10-CM | POA: Diagnosis not present

## 2020-10-12 DIAGNOSIS — R102 Pelvic and perineal pain: Secondary | ICD-10-CM | POA: Diagnosis not present

## 2020-10-12 DIAGNOSIS — R112 Nausea with vomiting, unspecified: Secondary | ICD-10-CM | POA: Diagnosis not present

## 2020-10-12 MED ORDER — PREDNISONE 20 MG PO TABS: 20.0000 mg | ORAL_TABLET | Freq: Two times a day (BID) | ORAL | 0 refills | Status: AC

## 2020-10-12 NOTE — ED Provider Notes (Signed)
[facility]   [id number] 10/12/20 Arrival Time: 1803   CC: sinus congestion; abdominal pain.    SUBJECTIVE: History from: patient.  Sarah Campbell is a 20 y.o. female who presents with sinus congestion and sore throat x 3 days.  Denies sick exposure to COVID, flu or strep.  Has tried OTC medications without relief.  Denies aggravating factors.  Reports previous symptoms in the past with allergies.   Denies fever, chills, fatigue, sinus pain, rhinorrhea, sore throat, SOB, wheezing, chest pain, nausea, changes in bowel or bladder habits.    Also mentions lower abdominal pain that started last night.  Has been on menstrual cycle x 3 weeks.  OB/GYN told here to be seen since their office was closing.  Taking megace.    ROS: As per HPI.  All other pertinent ROS negative.     History reviewed. No pertinent past medical history. History reviewed. No pertinent surgical history. No Known Allergies Current Facility-Administered Medications on File Prior to Encounter  Medication Dose Route Frequency Provider Last Rate Last Admin   medroxyPROGESTERone (DEPO-PROVERA) injection 150 mg  150 mg Intramuscular Once Cyril Mourning A, NP       Current Outpatient Medications on File Prior to Encounter  Medication Sig Dispense Refill   azithromycin (ZITHROMAX) 250 MG tablet Take first 2 tablets together, then 1 every day until finished. 6 tablet 0   Fexofenadine HCl (ALLEGRA PO) Take by mouth as needed.     ipratropium (ATROVENT) 0.03 % nasal spray Place 2 sprays into both nostrils 2 (two) times daily as needed for rhinitis. 30 mL 0   medroxyPROGESTERone (DEPO-PROVERA) 150 MG/ML injection Inject 1 mL (150 mg total) into the muscle every 3 (three) months. 1 mL 4   megestrol (MEGACE) 40 MG tablet Take 1 tablet (40 mg total) by mouth daily. Take 3 at one time x 5d, then 2 at one time x 5d, then 1 qd until bleeding stops. 45 tablet 3   naproxen (NAPROSYN) 500 MG tablet Take 1 tablet (500 mg  total) by mouth 2 (two) times daily with a meal. 30 tablet 0   [DISCONTINUED] omeprazole (PRILOSEC) 20 MG capsule Take 1 capsule (20 mg total) by mouth 2 (two) times daily. 10 capsule 0   Social History   Socioeconomic History   Marital status: Single    Spouse name: Not on file   Number of children: Not on file   Years of education: Not on file   Highest education level: Not on file  Occupational History   Not on file  Tobacco Use   Smoking status: Every Day    Types: E-cigarettes   Smokeless tobacco: Never  Vaping Use   Vaping Use: Every day  Substance and Sexual Activity   Alcohol use: No   Drug use: No   Sexual activity: Not Currently    Birth control/protection: Injection  Other Topics Concern   Not on file  Social History Narrative   Not on file   Social Determinants of Health   Financial Resource Strain: Low Risk    Difficulty of Paying Living Expenses: Not hard at all  Food Insecurity: No Food Insecurity   Worried About Programme researcher, broadcasting/film/video in the Last Year: Never true   Ran Out of Food in the Last Year: Never true  Transportation Needs: No Transportation Needs   Lack of Transportation (Medical): No   Lack of Transportation (Non-Medical): No  Physical Activity: Sufficiently Active   Days of Exercise  per Week: 7 days   Minutes of Exercise per Session: 60 min  Stress: Stress Concern Present   Feeling of Stress : To some extent  Social Connections: Moderately Isolated   Frequency of Communication with Friends and Family: More than three times a week   Frequency of Social Gatherings with Friends and Family: More than three times a week   Attends Religious Services: 1 to 4 times per year   Active Member of [employer] or Organizations: No   Attends Engineer, structural: Never   Marital Status: Never married  Catering manager Violence: Not At Risk   Fear of Current or Ex-Partner: No   Emotionally Abused: No   Physically Abused: No   Sexually Abused: No    Family History  Problem Relation Age of Onset   Heart attack Paternal Grandmother    Diabetes Maternal Grandfather    Heart attack Maternal Grandfather    Alcoholism Father     OBJECTIVE:  Vitals:   10/12/20 1900  BP: 111/72  Pulse: 97  Resp: 18  Temp: 98.6 F (37 C)  TempSrc: Oral  SpO2: 96%     General appearance: alert; appears fatigued, but nontoxic; speaking in full sentences and tolerating own secretions HEENT: NCAT; Ears: EACs clear, TMs pearly gray; Eyes: PERRL.  EOM grossly intact. Nose: nares patent with clear rhinorrhea, Throat: oropharynx clear, tonsils non erythematous or enlarged, uvula midline  Neck: supple without LAD Lungs: unlabored respirations, symmetrical air entry; cough: absent; no respiratory distress; CTAB Heart: regular rate and rhythm.   Skin: warm and dry Psychological: alert and cooperative; normal mood and affect  ASSESSMENT & PLAN:  1. Sinus congestion   2. Lower abdominal pain     Meds ordered this encounter  Medications   predniSONE (DELTASONE) 20 MG tablet    Sig: Take 1 tablet (20 mg total) by mouth 2 (two) times daily with a meal for 5 days.    Dispense:  10 tablet    Refill:  0    Order Specific Question:   Supervising Provider    Answer:   Eustace Moore [[id number]]   Unable to determine cause of lower abdominal pain in urgent care setting.  Will go to the ED for further evaluation and management.  Would like to defer further evaluation for abdominal pain to ED.    Get plenty of rest and push fluids Prednisone prescribed Use OTC zyrtec for nasal congestion, runny nose, and/or sore throat Use OTC flonase for nasal congestion and runny nose Use medications daily for symptom relief Use OTC medications like ibuprofen or tylenol as needed fever or pain Call or go to the ED if you have any new or worsening symptoms such as fever, worsening cough, shortness of breath, chest tightness, chest pain, turning blue, changes in mental  status, etc...   Reviewed expectations re: course of current medical issues. Questions answered. Outlined signs and symptoms indicating need for more acute intervention. Patient verbalized understanding. After Visit Summary given.          Rennis Harding, PA-C 10/12/20 1921

## 2020-10-12 NOTE — Discharge Instructions (Addendum)
Unable to determine cause of lower abdominal pain in urgent care setting.  Will go to the ED for further evaluation and management  Get plenty of rest and push fluids Prednisone prescribed Use OTC zyrtec for nasal congestion, runny nose, and/or sore throat Use OTC flonase for nasal congestion and runny nose Use medications daily for symptom relief Use OTC medications like ibuprofen or tylenol as needed fever or pain Call or go to the ED if you have any new or worsening symptoms such as fever, worsening cough, shortness of breath, chest tightness, chest pain, turning blue, changes in mental status, etc..Marland Kitchen

## 2020-10-12 NOTE — ED Triage Notes (Signed)
Pain to lower abd that is shooting down her legs that started last night.  Has been on cycle x 3 weeks now.  Called obgyn and was told to come here because they were closing.  Pt is currently taking medication to make her cycle stop.  Also having sinus pain/pressure and sore throat x 3 days.

## 2020-10-24 ENCOUNTER — Other Ambulatory Visit (HOSPITAL_COMMUNITY)
Admission: RE | Admit: 2020-10-24 | Discharge: 2020-10-24 | Disposition: A | Discharge status: Home / Self Care | Payer: BC Managed Care – PPO | Source: Ambulatory Visit | Attending: Adult Health | Admitting: Adult Health

## 2020-10-24 ENCOUNTER — Encounter: Payer: Self-pay | Admitting: Adult Health

## 2020-10-24 ENCOUNTER — Other Ambulatory Visit: Payer: Self-pay

## 2020-10-24 ENCOUNTER — Ambulatory Visit: Payer: BC Managed Care – PPO | Admitting: Adult Health

## 2020-10-24 VITALS — BP 111/76 | HR 76 | Ht 66.5 in | Wt 141.5 lb

## 2020-10-24 DIAGNOSIS — Z3202 Encounter for pregnancy test, result negative: Secondary | ICD-10-CM | POA: Diagnosis not present

## 2020-10-24 DIAGNOSIS — Z113 Encounter for screening for infections with a predominantly sexual mode of transmission: Secondary | ICD-10-CM

## 2020-10-24 DIAGNOSIS — N946 Dysmenorrhea, unspecified: Secondary | ICD-10-CM | POA: Diagnosis not present

## 2020-10-24 DIAGNOSIS — R102 Pelvic and perineal pain: Secondary | ICD-10-CM | POA: Diagnosis not present

## 2020-10-24 DIAGNOSIS — N921 Excessive and frequent menstruation with irregular cycle: Secondary | ICD-10-CM | POA: Diagnosis not present

## 2020-10-24 HISTORY — DX: Dysmenorrhea, unspecified: N94.6

## 2020-10-24 LAB — POCT URINE PREGNANCY: Preg Test, Ur: NEGATIVE

## 2020-10-24 NOTE — Progress Notes (Signed)
  Subjective:     Patient ID: Sarah Campbell, female   DOB: 08/02/2000, 20 y.o.   MRN: [id number]  HPI Arnella is a 20 year old white female,single, G0P0, in complaining of heavy periods, and pelvic pain. She was seen 10/12/20 at [facility] ER, and was given pain meds, only. PCP is [facility].  Lab Results  Component Value Date   DIAGPAP  05/30/2020    - Negative for intraepithelial lesion or malignancy (NILM)    Review of Systems +heavy periods +clots +pelvic pain Feels itchy at times Reviewed past medical,surgical, social and family history. Reviewed medications and allergies.     Objective:   Physical Exam BP 111/76 (BP Location: Left Arm, Patient Position: Sitting, Cuff Size: Normal)   Pulse 76   Ht 5' 6.5" (1.689 m)   Wt 141 lb 8 oz (64.2 kg)   LMP 09/21/2020 Comment: stopped bleeding on 9/23  BMI 22.50 kg/m  UPT is negative. Skin warm and dry.Pelvic: external genitalia is normal in appearance no lesions, vagina: white discharge without odor,urethra has no lesions or masses noted, cervix:smooth,  uterus: normal size, shape and contour,mildly tender, no masses felt, adnexa: no masses or tenderness noted. Bladder is non tender and no masses felt. CV swab obtained    Fall risk is moderate  [facility] - 10/24/20 1428       Pregnancy Intention Screening   Does the patient want to become pregnant in the next year? No    Does the patient's partner want to become pregnant in the next year? No    Would the patient like to discuss contraceptive options today? No      Contraception Wrap Up   Current Method Female Condom    End Method Female Condom    Contraception Counseling Provided No            Examination chaperoned by Malachy Mood LPN Assessment:       1. Pregnancy examination or test, negative result  - POCT urine pregnancy  2. Menorrhagia with irregular cycle [place] scheduled at [facility] 10/30/20 at 3:30 pm to assess uterus and ovaries - US PELVIC COMPLETE WITH  TRANSVAGINAL; Future  3. Dysmenorrhea  - US PELVIC COMPLETE WITH TRANSVAGINAL; Future  4. Pelvic pain  - US PELVIC COMPLETE WITH TRANSVAGINAL; Future  5. Screening examination for STD (sexually transmitted disease) CV swab sent for GC/CHL,trich and BV, yeast,will talk when results back - Cervicovaginal ancillary only( [place])  Plan:     Follow up in 9 days to go over [place] and discuss options of treatment

## 2020-10-26 ENCOUNTER — Other Ambulatory Visit: Payer: Self-pay | Admitting: Adult Health

## 2020-10-26 LAB — CERVICOVAGINAL ANCILLARY ONLY
Bacterial Vaginitis (gardnerella): NEGATIVE
Candida Glabrata: NEGATIVE
Candida Vaginitis: POSITIVE — AB
Chlamydia: NEGATIVE
Comment: NEGATIVE
Comment: NEGATIVE
Comment: NEGATIVE
Comment: NEGATIVE
Comment: NEGATIVE
Comment: NORMAL
Neisseria Gonorrhea: NEGATIVE
Trichomonas: NEGATIVE

## 2020-10-26 MED ORDER — FLUCONAZOLE 150 MG PO TABS: ORAL_TABLET | ORAL | 1 refills | Status: DC

## 2020-10-26 NOTE — Progress Notes (Signed)
+  yeast on vaginal swab will rx diflucan  

## 2020-10-30 ENCOUNTER — Ambulatory Visit (HOSPITAL_COMMUNITY)
Admission: RE | Admit: 2020-10-30 | Discharge: 2020-10-30 | Disposition: A | Discharge status: Home / Self Care | Payer: BC Managed Care – PPO | Source: Ambulatory Visit | Attending: Adult Health | Admitting: Adult Health

## 2020-10-30 ENCOUNTER — Other Ambulatory Visit: Payer: Self-pay

## 2020-10-30 DIAGNOSIS — N946 Dysmenorrhea, unspecified: Secondary | ICD-10-CM | POA: Insufficient documentation

## 2020-10-30 DIAGNOSIS — R102 Pelvic and perineal pain: Secondary | ICD-10-CM | POA: Insufficient documentation

## 2020-10-30 DIAGNOSIS — N921 Excessive and frequent menstruation with irregular cycle: Secondary | ICD-10-CM | POA: Insufficient documentation

## 2020-10-30 IMAGING — US US PELVIS COMPLETE WITH TRANSVAGINAL
1 series · 13 of 25 positions shown · non-contrast
Comparison: CT from [DATE]

CLINICAL DATA: Initial evaluation for menorrhagia with irregular
cycle. Pelvic pain.



[Series 1: us pelvis complete with transvaginal · 0.22mm/px · 13 of 152 slices shown]
[im 1/152]
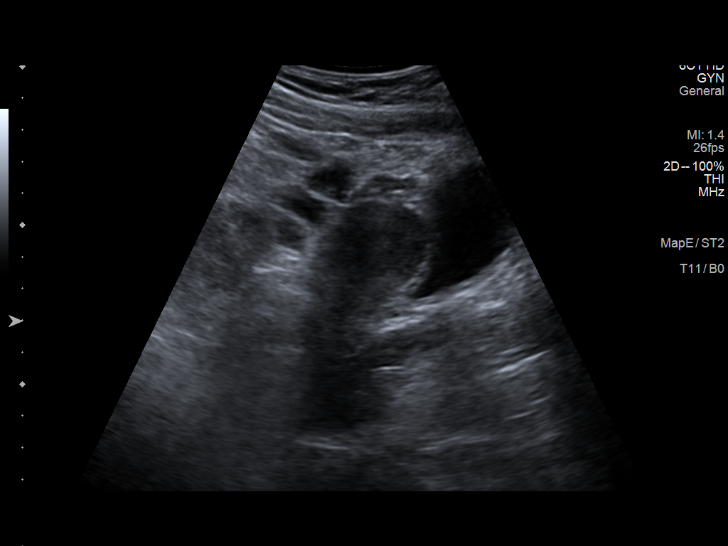
[im 13/152]
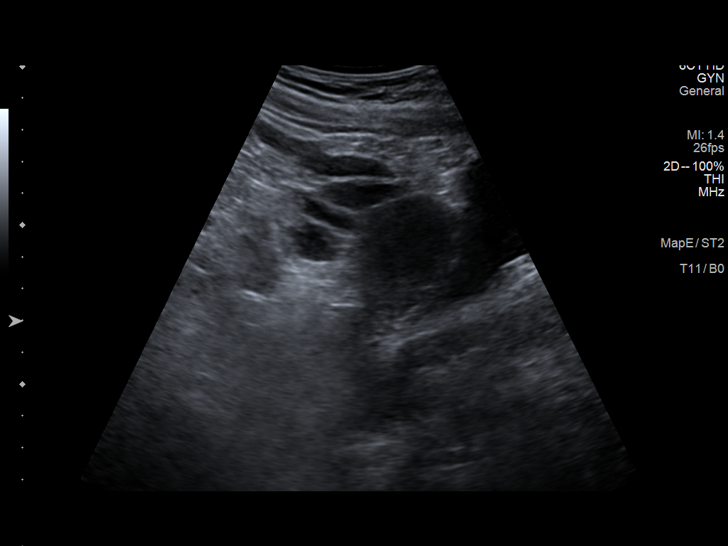
[im 26/152]
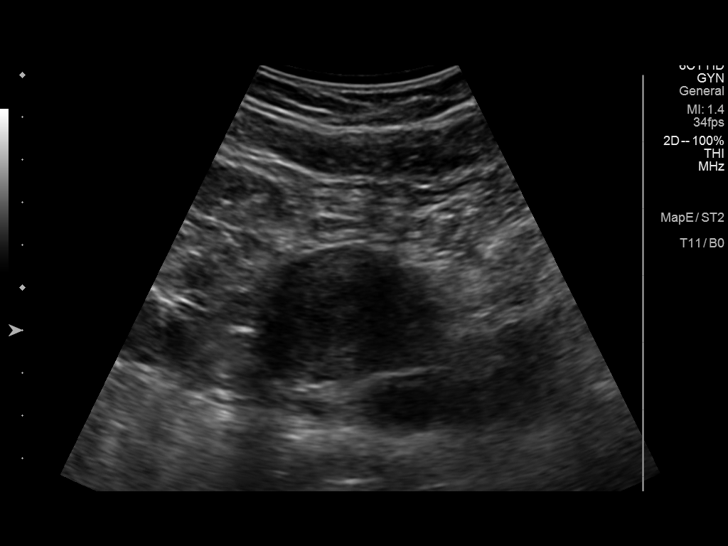
[im 38/152]
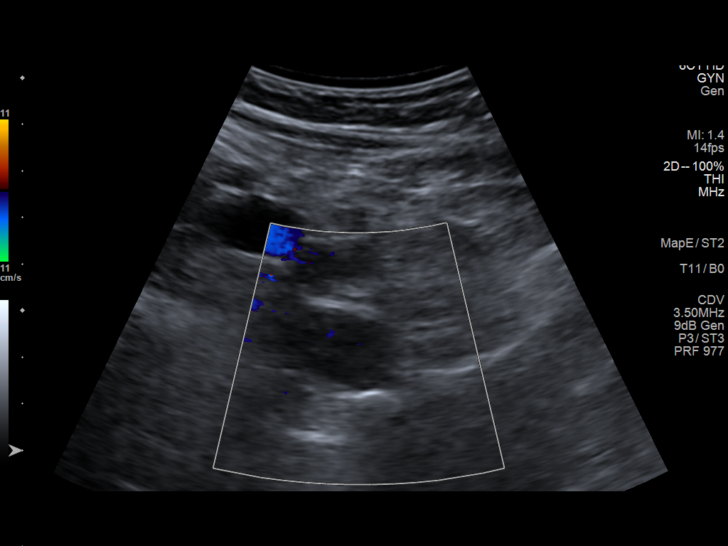
[im 51/152]
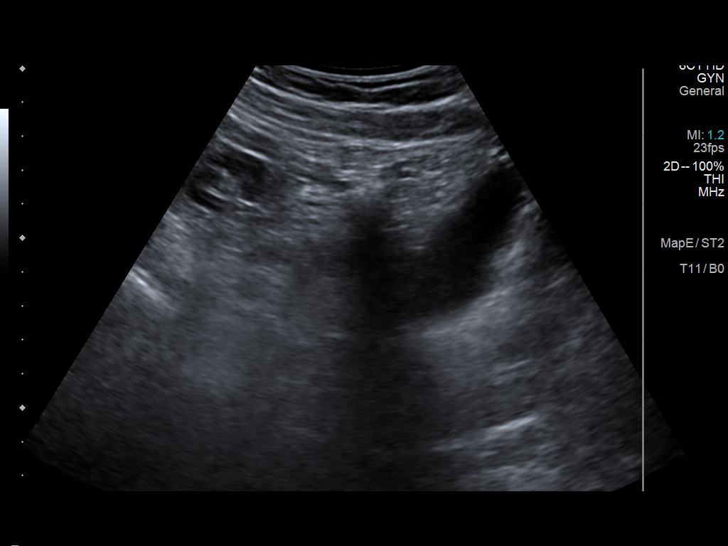
[im 63/152]
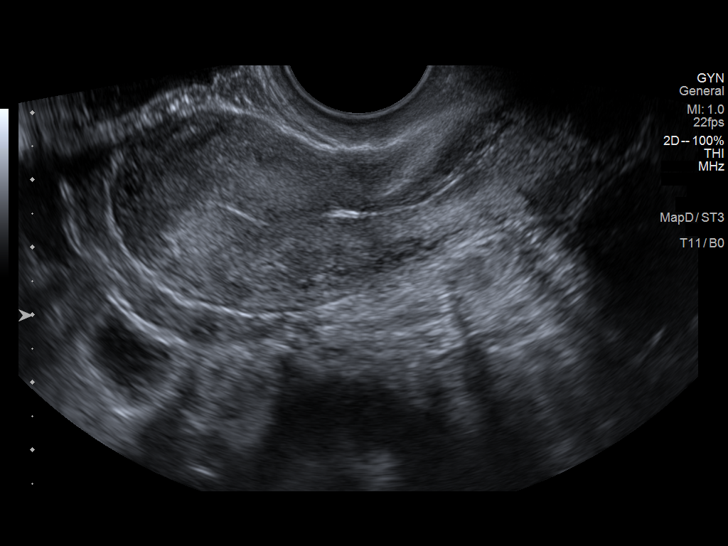
[im 76/152]
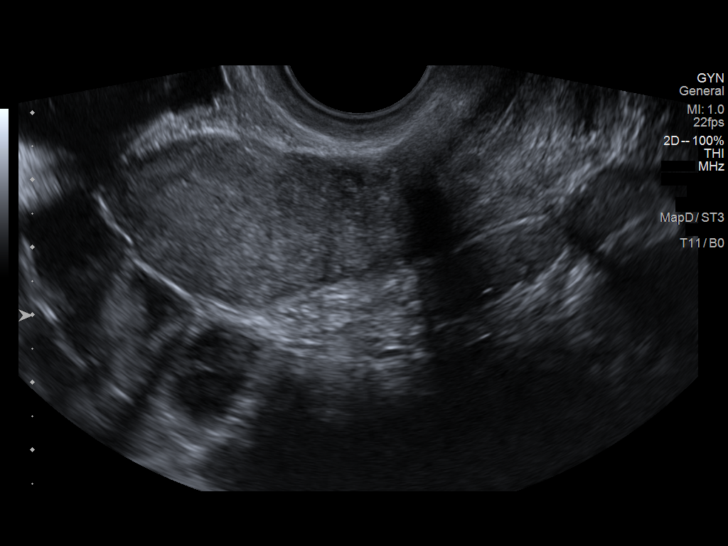
[im 89/152]
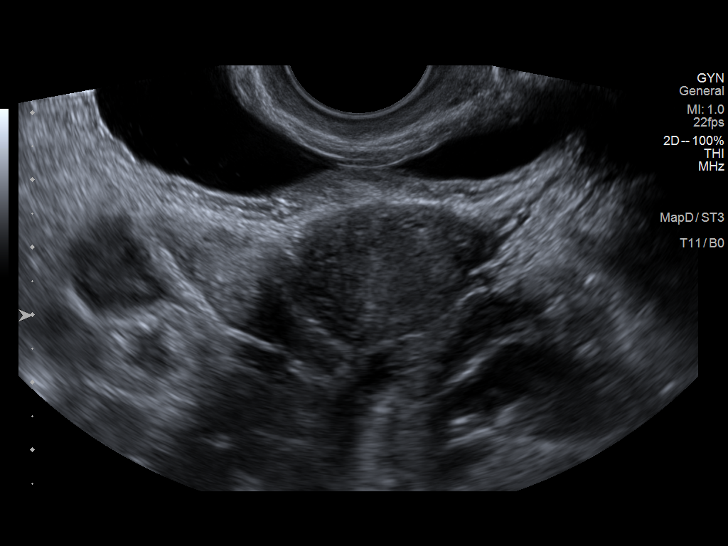
[im 101/152]
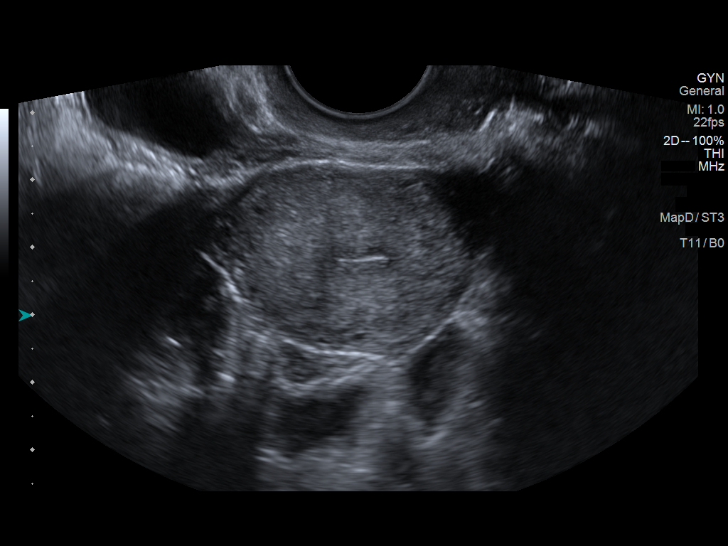
[im 114/152]
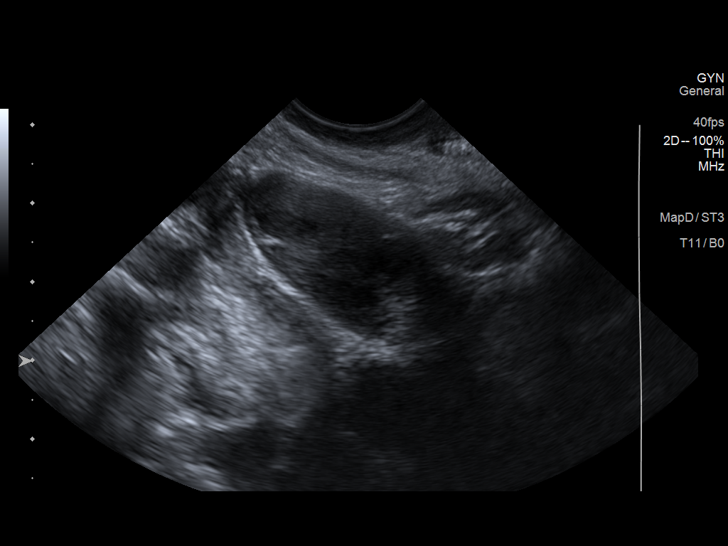
[im 126/152]
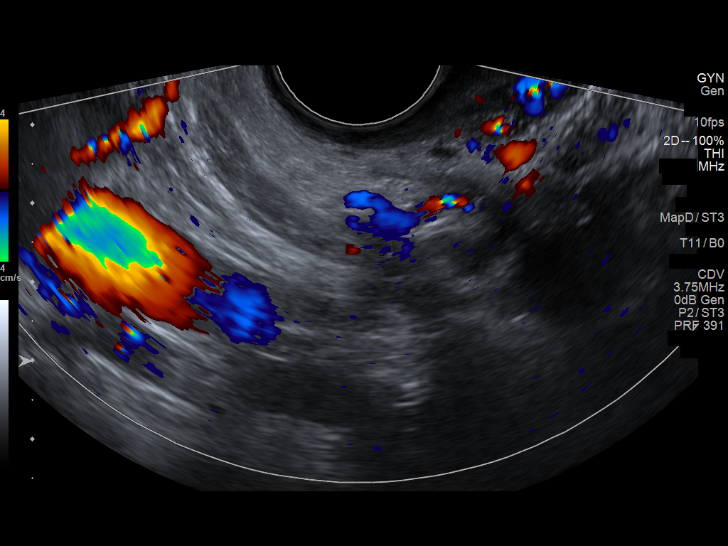
[im 139/152]
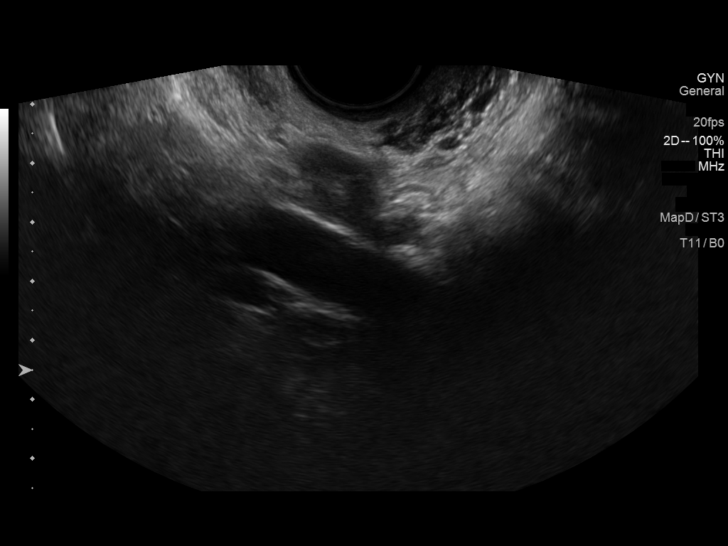
[im 152/152]
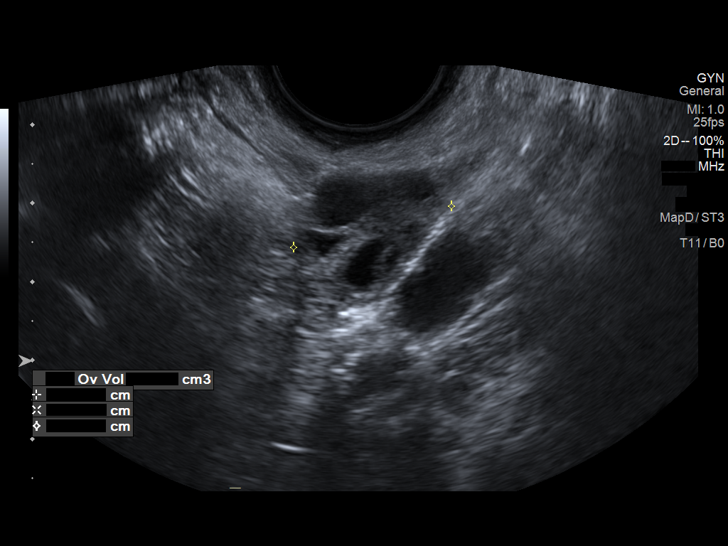

[13 of 25 positions shown; findings below may reference images not displayed]

FINDINGS: Uterus

Measurements: 7.6 x 3.4 x 4.2 cm = volume: 50.4 mL. Uterus is
anteverted. No discrete fibroid or other mass.

Endometrium

Thickness: 3.8 mm.  No focal abnormality visualized.

Right ovary

Measurements: 3.1 x 1.5 x 2.3 cm = volume: 5.3 mL. Normal
appearance/no adnexal mass.

Left ovary

Measurements: 3.1 x 1.8 x 2.1 cm = volume: 5.8 mL. Normal
appearance/no adnexal mass.

Other findings

No abnormal free fluid.
IMPRESSION: 1. Normal pelvic ultrasound.
2. Endometrial stripe measures 3.8 mm in thickness without
abnormality. If bleeding remains unresponsive to hormonal or medical
therapy, sonohysterogram should be considered for focal lesion
work-up. (Ref: Radiological Reasoning: Algorithmic Workup of
Abnormal Vaginal Bleeding with Endovaginal Sonography and
Sonohysterography. AJR [AH]; 191:S68-73).

## 2020-11-03 ENCOUNTER — Encounter: Payer: Self-pay | Admitting: Adult Health

## 2020-11-03 ENCOUNTER — Other Ambulatory Visit: Payer: Self-pay

## 2020-11-03 ENCOUNTER — Ambulatory Visit: Payer: BC Managed Care – PPO | Admitting: Adult Health

## 2020-11-03 VITALS — BP 128/90 | HR 87 | Ht 66.5 in | Wt 138.0 lb

## 2020-11-03 DIAGNOSIS — N946 Dysmenorrhea, unspecified: Secondary | ICD-10-CM

## 2020-11-03 DIAGNOSIS — Z30011 Encounter for initial prescription of contraceptive pills: Secondary | ICD-10-CM | POA: Diagnosis not present

## 2020-11-03 DIAGNOSIS — N921 Excessive and frequent menstruation with irregular cycle: Secondary | ICD-10-CM | POA: Diagnosis not present

## 2020-11-03 MED ORDER — LO LOESTRIN FE 1 MG-10 MCG / 10 MCG PO TABS: 1.0000 | ORAL_TABLET | Freq: Every day | ORAL | 0 refills | Status: DC

## 2020-11-03 NOTE — Progress Notes (Signed)
  Subjective:     Patient ID: Sarah Campbell, female   DOB: 08/06/2000, 20 y.o.   MRN: [id number]  HPI Deidra is a 20 year old white female,single, G0P0, in to review [place], for menorrhagia and dysmenorrhea. She stopped bleeding for 1 week and it has started back. PCP is [facility]. Lab Results  Component Value Date   DIAGPAP  05/30/2020    - Negative for intraepithelial lesion or malignancy (NILM)    Review of Systems +bleeding  +painful periods Reviewed past medical,surgical, social and family history. Reviewed medications and allergies.     Objective:   Physical Exam BP 128/90 (BP Location: Left Arm, Patient Position: Sitting, Cuff Size: Normal)   Pulse 87   Ht 5' 6.5" (1.689 m)   Wt 138 lb (62.6 kg)   LMP 11/02/2020 (Exact Date)   BMI 21.94 kg/m   Skin warm and dry. Lungs: clear to ausculation bilaterally. Cardiovascular: regular rate and rhythm.  [place] was normal. Discussed trying lo Loestrin to regulate periods.  [facility] - 11/03/20 1140       Pregnancy Intention Screening   Does the patient want to become pregnant in the next year? No    Does the patient's partner want to become pregnant in the next year? No    Would the patient like to discuss contraceptive options today? No      Contraception Wrap Up   Current Method Female Condom    End Method Female Condom;Oral Contraceptive    Contraception Counseling Provided Yes                Assessment:     1. Menorrhagia with irregular cycle Will try Lo loestrin  2. Dysmenorrhea   3. Encounter for initial prescription of contraceptive pills Given 3 packs lo loestrin to try, can start Sunday, and use condoms    Plan:     Follow up in 10 weeks

## 2020-12-08 ENCOUNTER — Other Ambulatory Visit: Payer: Self-pay

## 2020-12-08 ENCOUNTER — Ambulatory Visit (INDEPENDENT_AMBULATORY_CARE_PROVIDER_SITE_OTHER): Payer: BC Managed Care – PPO

## 2020-12-08 VITALS — BP 122/78 | HR 82 | Ht 66.5 in | Wt 138.0 lb

## 2020-12-08 DIAGNOSIS — Z3200 Encounter for pregnancy test, result unknown: Secondary | ICD-10-CM | POA: Diagnosis not present

## 2020-12-08 LAB — POCT URINE PREGNANCY: Preg Test, Ur: NEGATIVE

## 2020-12-08 NOTE — Progress Notes (Signed)
Chart reviewed for nurse visit. Agree with plan of care.  [name], NP 12/08/2020 9:20 AM

## 2020-12-08 NOTE — Progress Notes (Signed)
   NURSE VISIT- PREGNANCY CONFIRMATION   SUBJECTIVE:  Sarah Campbell is a 20 y.o. G0P0000 female at Unknown by certain LMP of Patient's last menstrual period was 11/02/2020 (exact date). Here for pregnancy confirmation.  Home pregnancy test: positive x 2   She reports  nausea and cramping .  She is not taking prenatal vitamins.    OBJECTIVE:  BP 122/78 (BP Location: Right Arm, Patient Position: Sitting, Cuff Size: Normal)   Pulse 82   Ht 5' 6.5" (1.689 m)   Wt 138 lb (62.6 kg)   LMP 11/02/2020 (Exact Date)   BMI 21.94 kg/m   Appears well, in no apparent distress  Results for orders placed or performed in visit on 12/08/20 (from the past 24 hour(s))  POCT urine pregnancy   Collection Time: 12/08/20  8:47 AM  Result Value Ref Range   Preg Test, Ur Negative Negative    ASSESSMENT: Negative pregnancy test    PLAN: Quant HCG lab to be drawn today. Will wait for results before further scheduling.    OB packet given: No  Alfonza Toft A Correen Bubolz  12/08/2020 8:51 AM

## 2020-12-09 LAB — BETA HCG QUANT (REF LAB): hCG Quant: <1 m[IU]/mL

## 2021-01-12 ENCOUNTER — Ambulatory Visit: Payer: BC Managed Care – PPO | Admitting: Adult Health

## 2021-01-28 NOTE — L&D Delivery Note (Addendum)
Patient: Sarah Campbell MRN: [id number]  GBS status: negative Patient is a 21 y.o. now G2P1 s/p NSVD at [redacted]w[redacted]d, who was admitted for IOL for pre eclampsia with BP in severe range. SROM 1h 45m prior to delivery with small amount of fluid.    Delivery Note At 6:03 AM a viable female was delivered via Vaginal, Spontaneous (Presentation: Left Occiput Anterior).  APGAR: 6, 8; weight pending.  Approximately 125cc fluid following delivery. Placenta status: Spontaneous;Pathology, Intact.  Cord: 3 vessels with the following complications: None.   Anesthesia: Epidural Episiotomy: None Lacerations: 1st degree Suture Repair: 3.0 vicryl Est. Blood Loss (mL): 105  Mom to postpartum.  Baby to Couplet care / Skin to Skin. Continue magnesium 24 hr after delivery. PRN labetalol  Madeleine Mendelow 11/30/2021, 6:27 AM     Head delivered LOA. No nuchal cord present. Shoulder and body delivered in usual fashion. Infant with spontaneous cry, placed on mother's abdomen, dried and bulb suctioned. Was taken to the warmer for NRP w/CPAP briefly. Was returned to mother. Cord clamped x 2 after 1-minute delay, and cut by family member. Cord blood drawn. Placenta delivered spontaneously with gentle cord traction. Fundus firm with massage and Pitocin. Perineum inspected and found to have a small first degree perineal laceration, which was repaired with one suture; good hemostasis achieved. The above was performed under my direct supervision and guidance.

## 2021-04-05 ENCOUNTER — Encounter (HOSPITAL_COMMUNITY): Payer: Self-pay

## 2021-04-05 ENCOUNTER — Other Ambulatory Visit: Payer: Self-pay

## 2021-04-05 ENCOUNTER — Emergency Department (HOSPITAL_COMMUNITY)
Admission: EM | Admit: 2021-04-05 | Discharge: 2021-04-06 | Disposition: A | Discharge status: Home / Self Care | Payer: BC Managed Care – PPO | Attending: Emergency Medicine | Admitting: Emergency Medicine

## 2021-04-05 DIAGNOSIS — Z20822 Contact with and (suspected) exposure to covid-19: Secondary | ICD-10-CM | POA: Insufficient documentation

## 2021-04-05 DIAGNOSIS — R202 Paresthesia of skin: Secondary | ICD-10-CM | POA: Diagnosis not present

## 2021-04-05 DIAGNOSIS — R531 Weakness: Secondary | ICD-10-CM | POA: Diagnosis not present

## 2021-04-05 DIAGNOSIS — M545 Low back pain, unspecified: Secondary | ICD-10-CM | POA: Insufficient documentation

## 2021-04-05 DIAGNOSIS — R200 Anesthesia of skin: Secondary | ICD-10-CM | POA: Diagnosis not present

## 2021-04-05 DIAGNOSIS — R29818 Other symptoms and signs involving the nervous system: Secondary | ICD-10-CM | POA: Diagnosis not present

## 2021-04-05 DIAGNOSIS — F17290 Nicotine dependence, other tobacco product, uncomplicated: Secondary | ICD-10-CM | POA: Diagnosis not present

## 2021-04-05 NOTE — ED Triage Notes (Signed)
Pt says numbing sensation and burning sensation to right leg started about 2 weeks ago, then tonight while driving her right leg went "totally numb and flaccid" pt says she cannot move or feel right leg.  ?

## 2021-04-06 ENCOUNTER — Emergency Department (HOSPITAL_COMMUNITY): Payer: BC Managed Care – PPO

## 2021-04-06 DIAGNOSIS — R200 Anesthesia of skin: Secondary | ICD-10-CM | POA: Diagnosis not present

## 2021-04-06 DIAGNOSIS — R29818 Other symptoms and signs involving the nervous system: Secondary | ICD-10-CM | POA: Diagnosis not present

## 2021-04-06 DIAGNOSIS — M545 Low back pain, unspecified: Secondary | ICD-10-CM | POA: Diagnosis not present

## 2021-04-06 DIAGNOSIS — R202 Paresthesia of skin: Secondary | ICD-10-CM | POA: Diagnosis not present

## 2021-04-06 DIAGNOSIS — F17290 Nicotine dependence, other tobacco product, uncomplicated: Secondary | ICD-10-CM | POA: Diagnosis not present

## 2021-04-06 DIAGNOSIS — Z20822 Contact with and (suspected) exposure to covid-19: Secondary | ICD-10-CM | POA: Diagnosis not present

## 2021-04-06 LAB — COMPREHENSIVE METABOLIC PANEL
ALT: 13 U/L (ref 0–44)
AST: 18 U/L (ref 15–41)
Albumin: 4.1 g/dL (ref 3.5–5.0)
Alkaline Phosphatase: 126 U/L (ref 38–126)
Anion gap: 8 (ref 5–15)
BUN: 8 mg/dL (ref 6–20)
CO2: 26 mmol/L (ref 22–32)
Calcium: 8.7 mg/dL — ABNORMAL LOW (ref 8.9–10.3)
Chloride: 106 mmol/L (ref 98–111)
Creatinine, Ser: 0.79 mg/dL (ref 0.44–1.00)
GFR, Estimated: >60 mL/min (ref >60)
Glucose, Bld: 87 mg/dL (ref 70–99)
Potassium: 3.9 mmol/L (ref 3.5–5.1)
Sodium: 140 mmol/L (ref 135–145)
Total Bilirubin: 0.4 mg/dL (ref 0.3–1.2)
Total Protein: 7.5 g/dL (ref 6.5–8.1)

## 2021-04-06 LAB — CBC
HCT: 42.3 % (ref 36.0–46.0)
Hemoglobin: 14.1 g/dL (ref 12.0–15.0)
MCH: 30.2 pg (ref 26.0–34.0)
MCHC: 33.3 g/dL (ref 30.0–36.0)
MCV: 90.6 fL (ref 80.0–100.0)
Platelets: 354 10*3/uL (ref 150–400)
RBC: 4.67 MIL/uL (ref 3.87–5.11)
RDW: 13.2 % (ref 11.5–15.5)
WBC: 8.7 10*3/uL (ref 4.0–10.5)
nRBC: 0 % (ref 0.0–0.2)

## 2021-04-06 LAB — RESP PANEL BY RT-PCR (FLU A&B, COVID) ARPGX2
Influenza A by PCR: NEGATIVE
Influenza B by PCR: NEGATIVE
SARS Coronavirus 2 by RT PCR: NEGATIVE

## 2021-04-06 IMAGING — MR MR THORACIC SPINE WO/W CM
4 of 9 series · 18 of 48 positions shown · IV contrast (gadavist)
Comparison: None.

CLINICAL DATA: Possible demyelinating disease; Low back pain, cauda
equina syndrome suspected; leg numbness

EXAM:
MRI HEAD WITHOUT AND WITH CONTRAST
MRI CERVICAL SPINE WITHOUT AND WITH CONTRAST
MRI THORACIC SPINE WITHOUT AND CONTRAST
MRI LUMBAR SPINE WITHOUT AND WITH CONTRAST
CONTRAST:  6mL GADAVIST GADOBUTROL 1 MMOL/ML IV SOLN
TECHNIQUE: Multiplanar, multiecho pulse sequences of the brain and surrounding
structures, and cervical, thoracic, and lumbar spine were obtained
without and with intravenous contrast.

[Series 20: T1 · sagittal · 3.0mm · 0.90mm/px · 4 of 12 slices shown (1 of 2)]
[im 1/12]
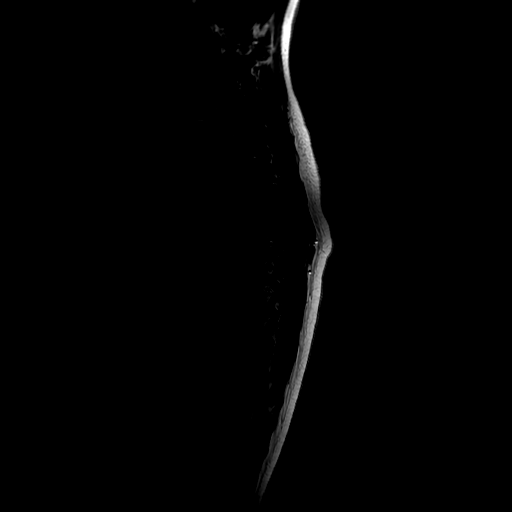
[im 4/12]
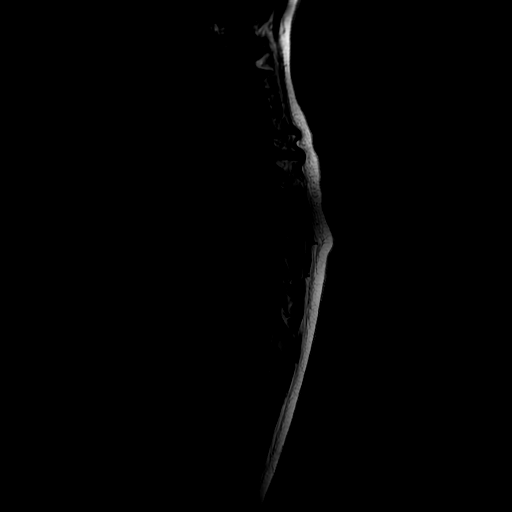
[im 8/12]
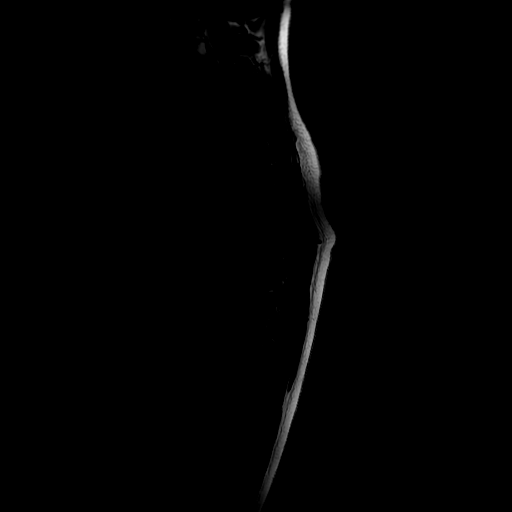
[im 12/12]
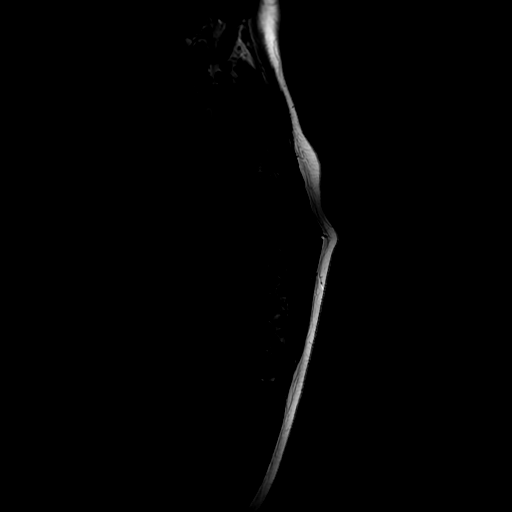

[Series 21: T2 · sagittal · 3.0mm · 0.66mm/px · 3 of 15 slices shown (1 of 2)]
[im 1/15]
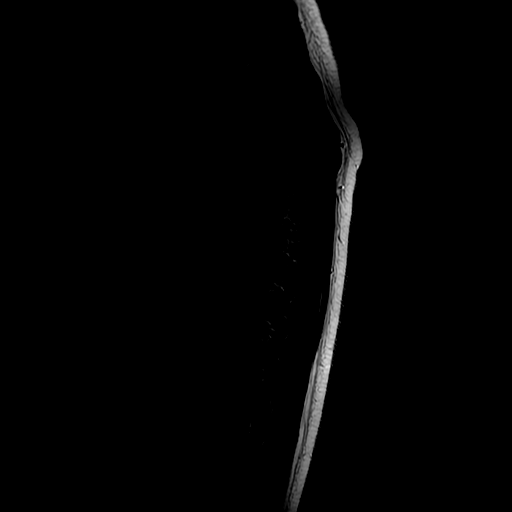
[im 8/15]
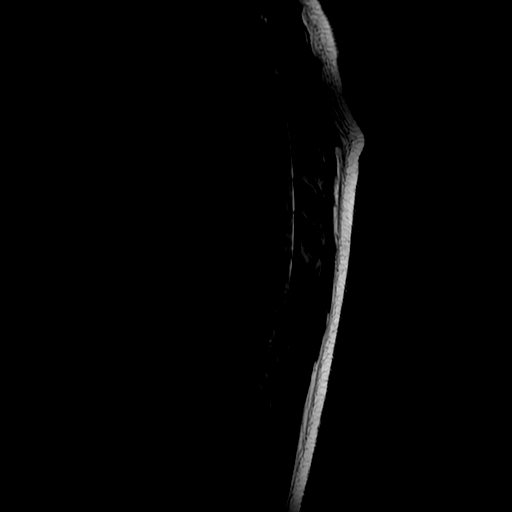
[im 15/15]
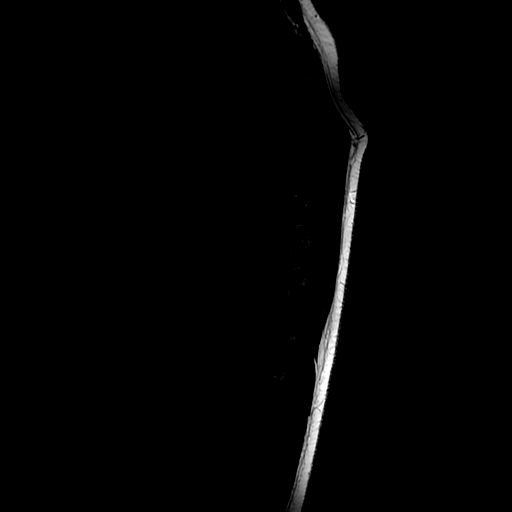

[Series 23: T1 · sagittal · 3.0mm · 0.66mm/px · 3 of 16 slices shown (2 of 2)]
[im 1/16]
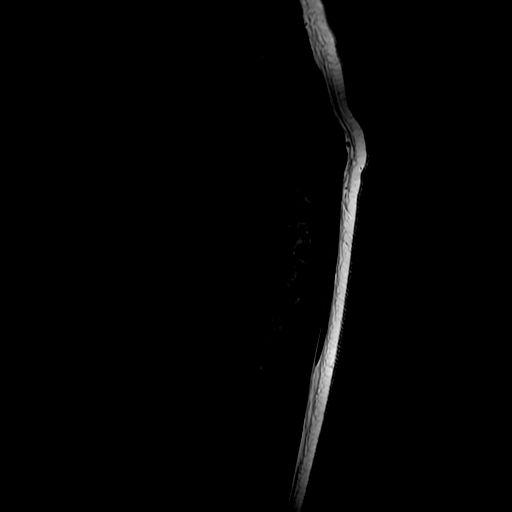
[im 8/16]
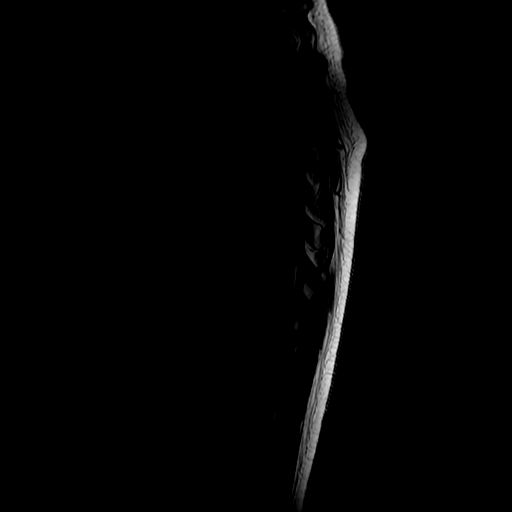
[im 16/16]
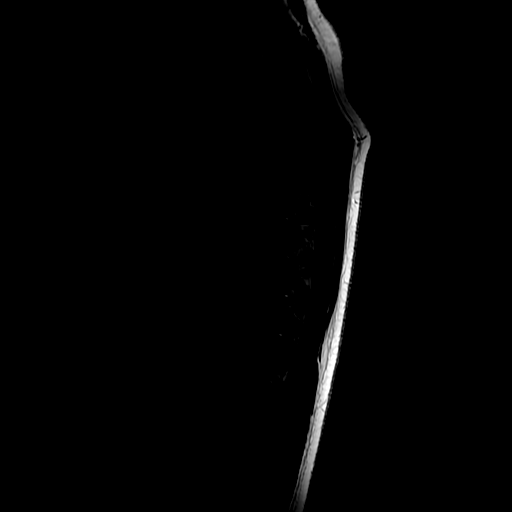

[Series 24: T2 · axial · 4.0mm · 0.39mm/px · z∈[-446,-198]mm · 8 of 39 slices shown (2 of 2)]
[im 1/39]
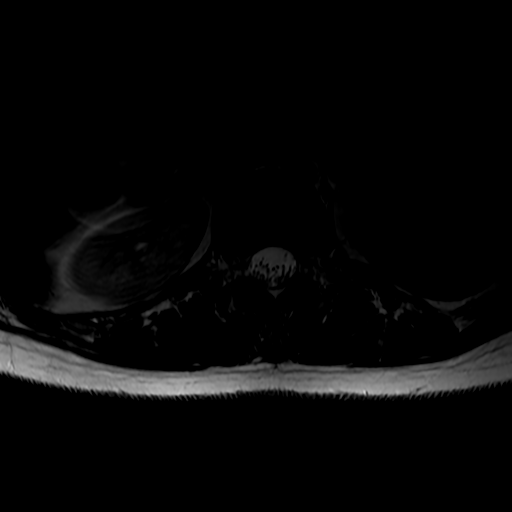
[im 6/39]
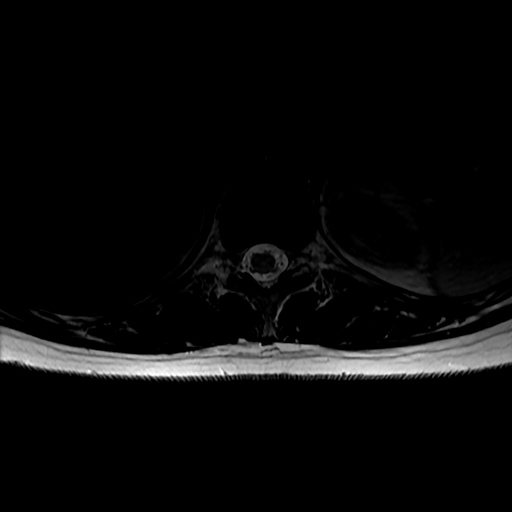
[im 11/39]
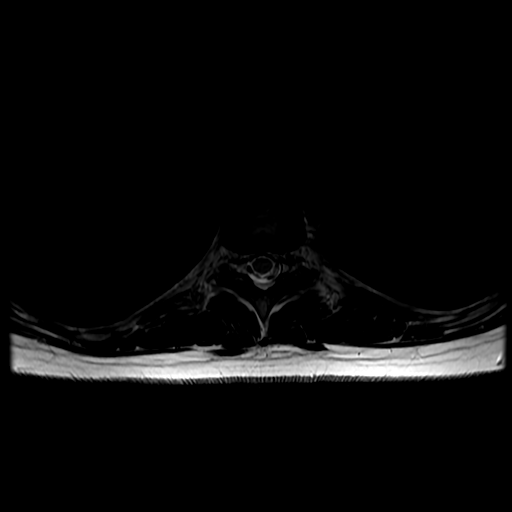
[im 17/39]
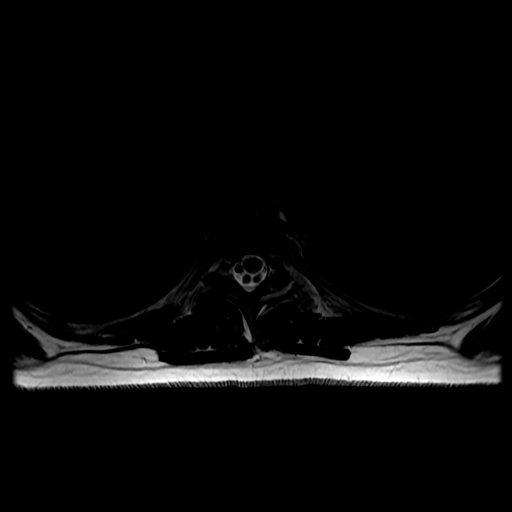
[im 22/39]
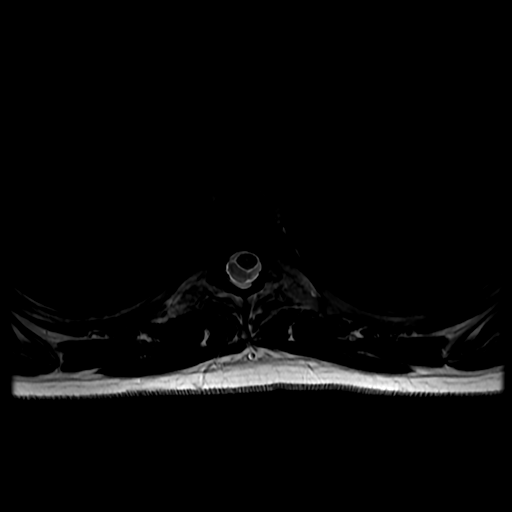
[im 28/39]
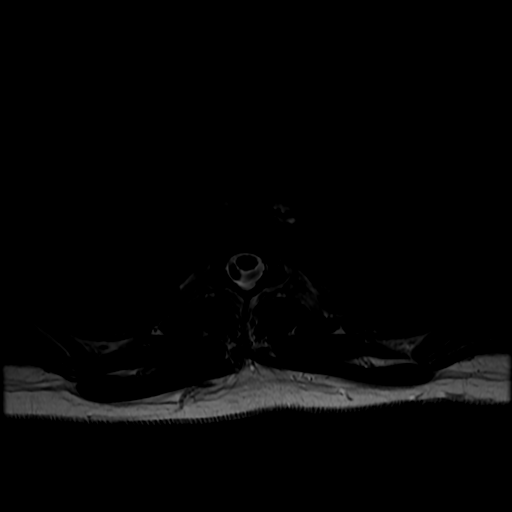
[im 33/39]
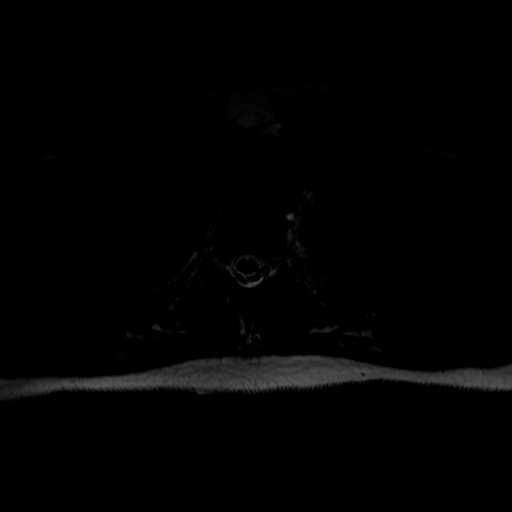
[im 39/39]
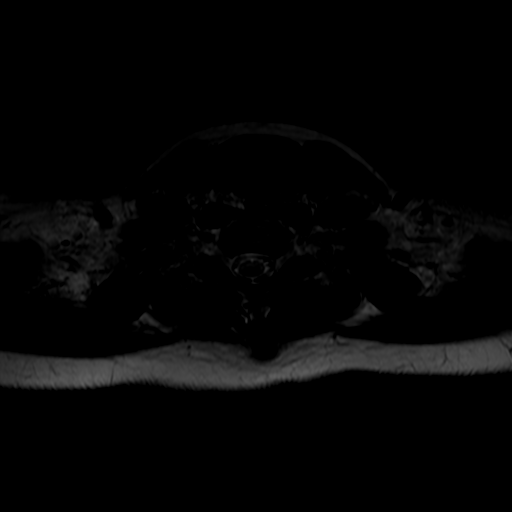

[18 of 48 positions shown; findings below may reference images not displayed]

FINDINGS: MRI HEAD

Brain: There is no acute infarction or intracranial hemorrhage.
There is no intracranial mass, mass effect, or edema. There is no
hydrocephalus or extra-axial fluid collection. Ventricles and sulci
are normal in size and configuration. No abnormal enhancement.

Vascular: Major vessel flow voids at the skull base are preserved.

Skull and upper cervical spine: Normal marrow signal is preserved.

Sinuses/Orbits: Minor mucosal thickening.  Orbits are unremarkable.

Other: Sella is unremarkable.  Mastoid air cells are clear.

MRI CERVICAL SPINE

Alignment: No significant listhesis.

Vertebrae: Vertebral body heights are maintained. No marrow edema.
No suspicious osseous lesion.

Cord: No abnormal signal or enhancement.

Posterior Fossa, vertebral arteries, paraspinal tissues:
Unremarkable.

Disc levels: No significant degenerative canal or foraminal
stenosis.

MRI THORACIC SPINE

Alignment:  Preserved.

Vertebrae: Vertebral body heights are maintained. No marrow edema.
No suspicious osseous lesion.

Cord:  No abnormal signal or enhancement.

Paraspinal and other soft tissues: Unremarkable.

Disc levels: Intervertebral disc heights and signal are maintained.
There is no degenerative canal or foraminal stenosis.

MRI LUMBAR SPINE

Alignment:  Preserved.

Vertebrae: Vertebral body heights are maintained. There is no marrow
edema. No suspicious osseous lesion.

Conus medullaris and cauda equina: The conus terminates at T12-L1.
Conus medullaris and cauda equina nerve roots are unremarkable.
There is no abnormal enhancement.

Paraspinal and other soft tissues: Unremarkable.

Disc levels: Intervertebral disc heights and signal are maintained.
There is no degenerative canal or foraminal stenosis.
IMPRESSION: No evidence of demyelinating disease. No mass or other significant
abnormality.

## 2021-04-06 IMAGING — MR MR HEAD WO/W CM
15 of 42 series · 16 of 48 positions shown · IV contrast (gadavist)
Comparison: None.

CLINICAL DATA: Possible demyelinating disease; Low back pain, cauda
equina syndrome suspected; leg numbness

EXAM:
MRI HEAD WITHOUT AND WITH CONTRAST
MRI CERVICAL SPINE WITHOUT AND WITH CONTRAST
MRI THORACIC SPINE WITHOUT AND CONTRAST
MRI LUMBAR SPINE WITHOUT AND WITH CONTRAST
CONTRAST:  6mL GADAVIST GADOBUTROL 1 MMOL/ML IV SOLN
TECHNIQUE: Multiplanar, multiecho pulse sequences of the brain and surrounding
structures, and cervical, thoracic, and lumbar spine were obtained
without and with intravenous contrast.

[Series 2: DWI · axial · 3.6mm · 0.94mm/px · 1 of 82 slices shown (1 of 2)]
[im 1/82]
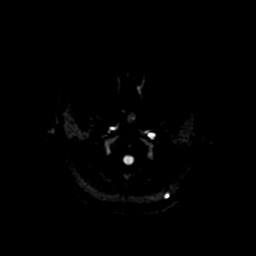

[Series 3: FLAIR · sagittal · 5.0mm · 0.47mm/px · 1 of 23 slices shown (1 of 4)]
[im 1/23]
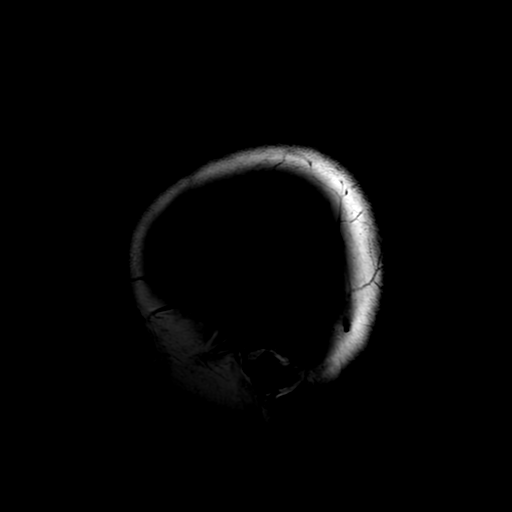

[Series 4: T2 · axial · 5.0mm · 0.23mm/px · 1 of 24 slices shown (1 of 2)]
[im 1/24]
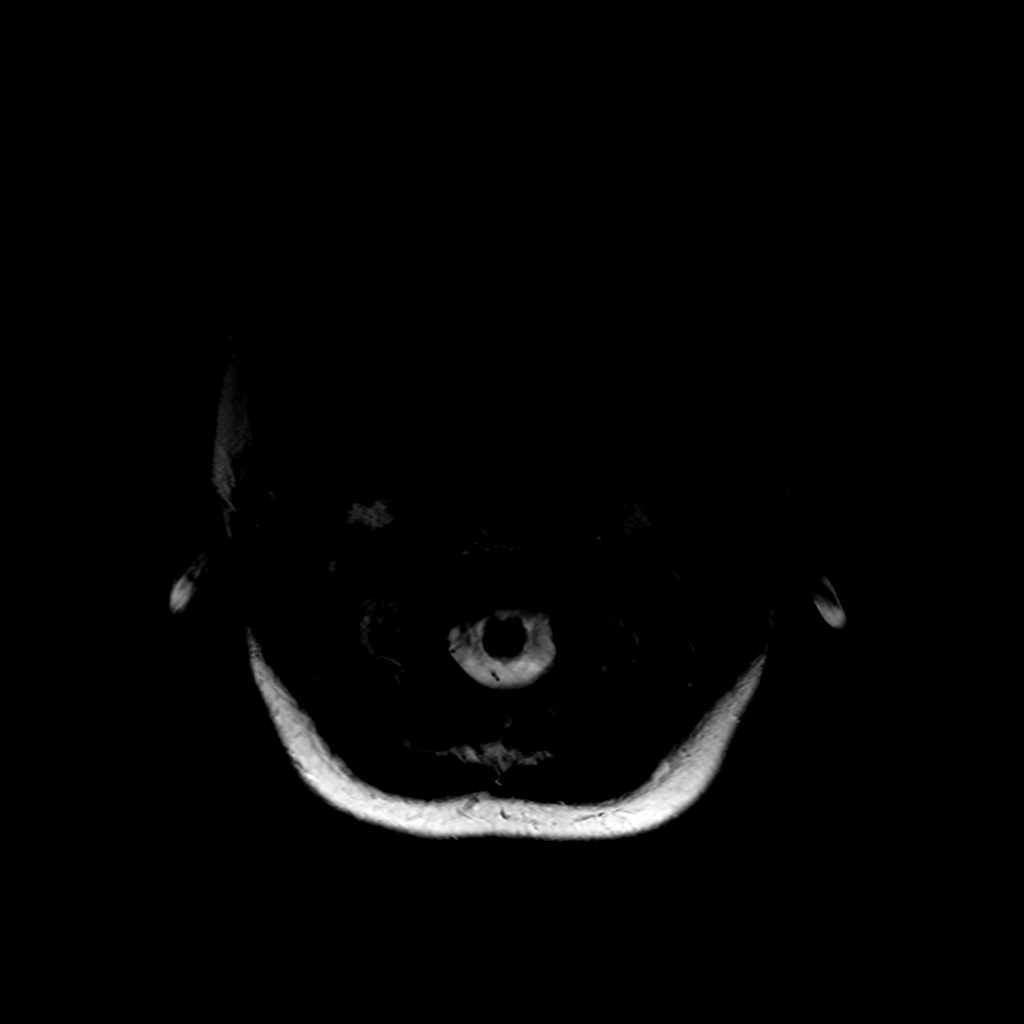

[Series 5: FLAIR · axial · 4.0mm · 0.45mm/px · 1 of 33 slices shown (2 of 4)]
[im 1/33]
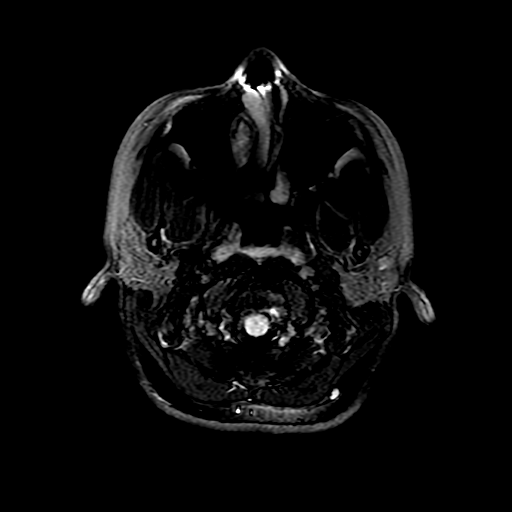

[Series 6: FLAIR · sagittal · 1.6mm · 0.49mm/px · 2 of 216 slices shown (3 of 4)]
[im 1/216]
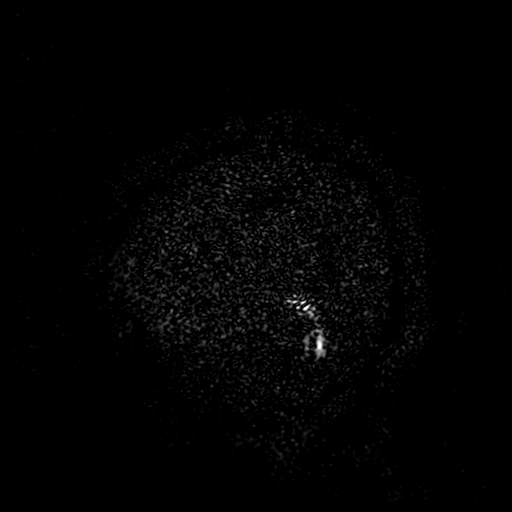
[im 216/216]
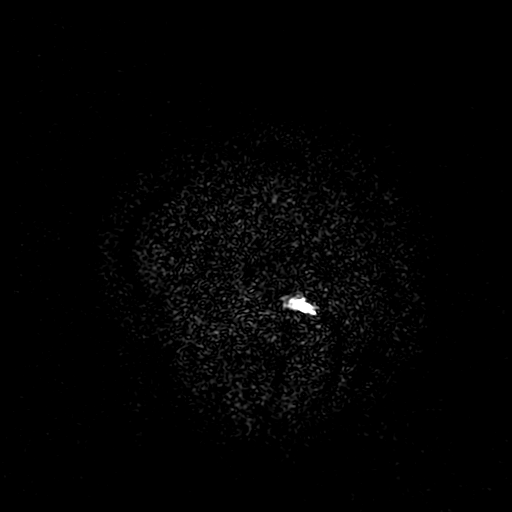

[Series 7: DWI · coronal · 5.0mm · 0.94mm/px · 1 of 66 slices shown (2 of 2)]
[im 1/66]
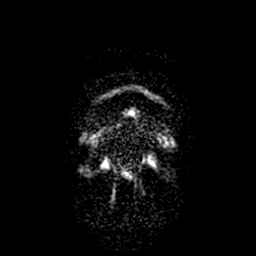

[Series 10: T2 · axial · 5.0mm · 0.23mm/px · 1 of 24 slices shown (2 of 2)]
[im 1/24]
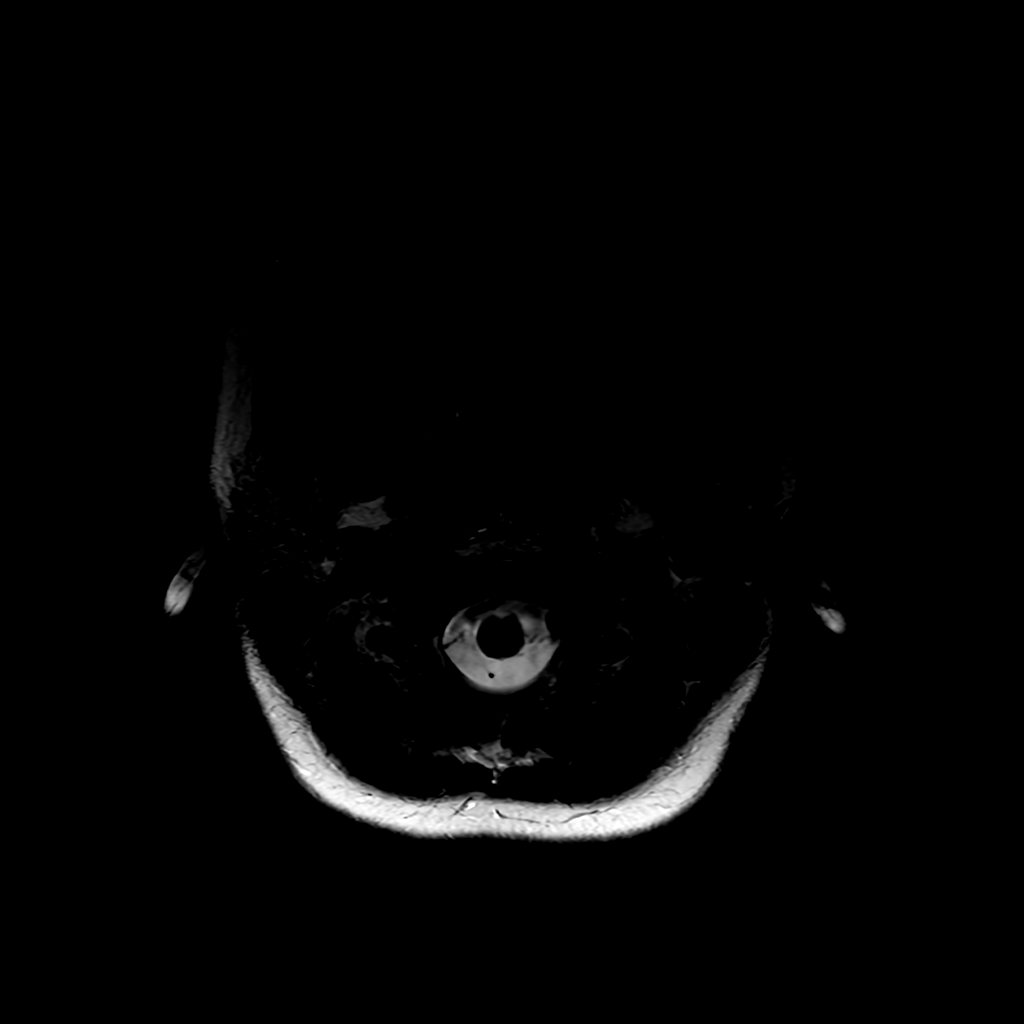

[Series 14: FLAIR · sagittal · 3.0mm · 0.43mm/px · 1 of 16 slices shown (4 of 4)]
[im 1/16]
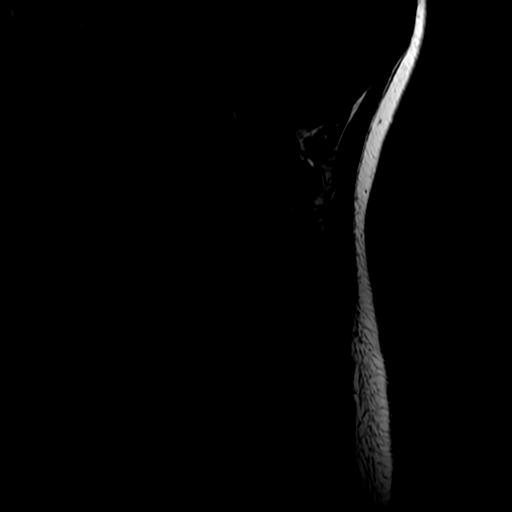

[Series 34: T1 post-contrast · sagittal · 4.0mm · 0.55mm/px · 1 of 15 slices shown (1 of 5)]
[im 1/15]
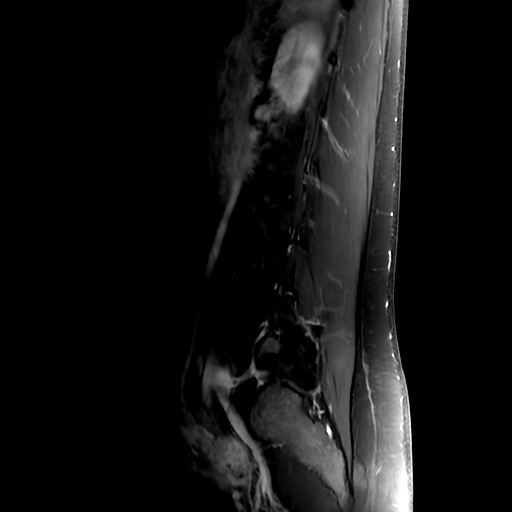

[Series 35: T1 post-contrast · axial · 4.0mm · 0.39mm/px · 1 of 38 slices shown (2 of 5)]
[im 1/38]
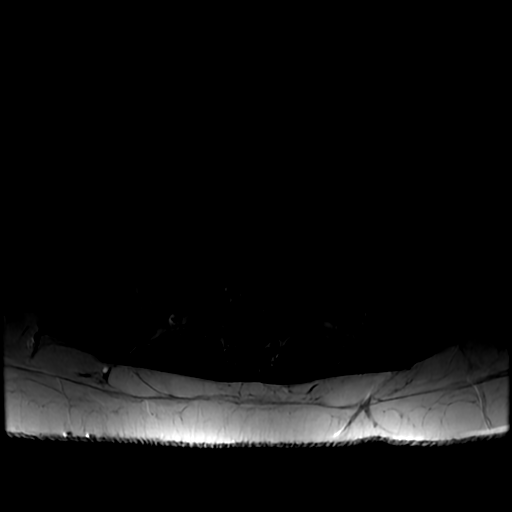

[Series 38: T1 post-contrast · axial · 4.0mm · 0.39mm/px · 1 of 39 slices shown (3 of 5)]
[im 1/39]
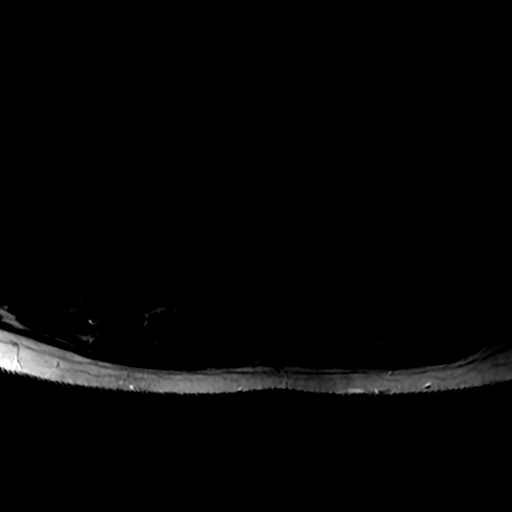

[Series 41: T1 post-contrast · axial · 3.0mm · 0.35mm/px · 1 of 30 slices shown (4 of 5)]
[im 1/30]
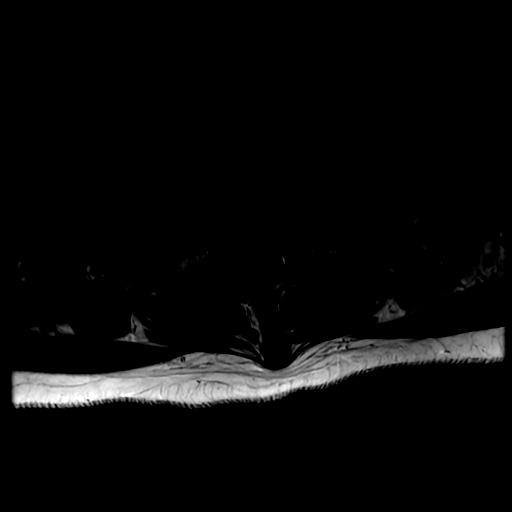

[Series 43: T1 post-contrast · coronal · 5.0mm · 0.39mm/px · 1 of 27 slices shown (5 of 5)]
[im 1/27]
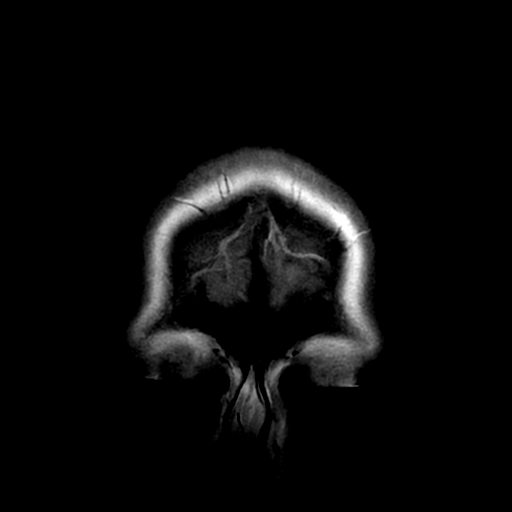

[Series 250: ADC · axial · 3.6mm · 0.94mm/px · 1 of 41 slices shown (1 of 2)]
[im 1/41]
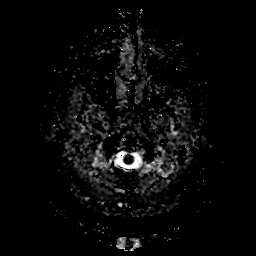

[Series 750: ADC · coronal · 5.0mm · 0.94mm/px · 1 of 33 slices shown (2 of 2)]
[im 1/33]
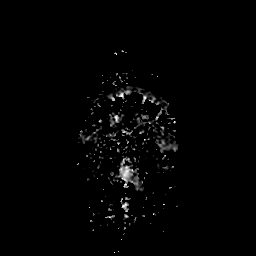

[16 of 48 positions shown; findings below may reference images not displayed]

FINDINGS: MRI HEAD

Brain: There is no acute infarction or intracranial hemorrhage.
There is no intracranial mass, mass effect, or edema. There is no
hydrocephalus or extra-axial fluid collection. Ventricles and sulci
are normal in size and configuration. No abnormal enhancement.

Vascular: Major vessel flow voids at the skull base are preserved.

Skull and upper cervical spine: Normal marrow signal is preserved.

Sinuses/Orbits: Minor mucosal thickening.  Orbits are unremarkable.

Other: Sella is unremarkable.  Mastoid air cells are clear.

MRI CERVICAL SPINE

Alignment: No significant listhesis.

Vertebrae: Vertebral body heights are maintained. No marrow edema.
No suspicious osseous lesion.

Cord: No abnormal signal or enhancement.

Posterior Fossa, vertebral arteries, paraspinal tissues:
Unremarkable.

Disc levels: No significant degenerative canal or foraminal
stenosis.

MRI THORACIC SPINE

Alignment:  Preserved.

Vertebrae: Vertebral body heights are maintained. No marrow edema.
No suspicious osseous lesion.

Cord:  No abnormal signal or enhancement.

Paraspinal and other soft tissues: Unremarkable.

Disc levels: Intervertebral disc heights and signal are maintained.
There is no degenerative canal or foraminal stenosis.

MRI LUMBAR SPINE

Alignment:  Preserved.

Vertebrae: Vertebral body heights are maintained. There is no marrow
edema. No suspicious osseous lesion.

Conus medullaris and cauda equina: The conus terminates at T12-L1.
Conus medullaris and cauda equina nerve roots are unremarkable.
There is no abnormal enhancement.

Paraspinal and other soft tissues: Unremarkable.

Disc levels: Intervertebral disc heights and signal are maintained.
There is no degenerative canal or foraminal stenosis.
IMPRESSION: No evidence of demyelinating disease. No mass or other significant
abnormality.

## 2021-04-06 IMAGING — MR MR LUMBAR SPINE WO/W CM
4 of 7 series · 18 of 48 positions shown · IV contrast (gadavist)
Comparison: None.

CLINICAL DATA: Possible demyelinating disease; Low back pain, cauda
equina syndrome suspected; leg numbness

EXAM:
MRI HEAD WITHOUT AND WITH CONTRAST
MRI CERVICAL SPINE WITHOUT AND WITH CONTRAST
MRI THORACIC SPINE WITHOUT AND CONTRAST
MRI LUMBAR SPINE WITHOUT AND WITH CONTRAST
CONTRAST:  6mL GADAVIST GADOBUTROL 1 MMOL/ML IV SOLN
TECHNIQUE: Multiplanar, multiecho pulse sequences of the brain and surrounding
structures, and cervical, thoracic, and lumbar spine were obtained
without and with intravenous contrast.

[Series 28: T2 · sagittal · 4.0mm · 0.55mm/px · 5 of 15 slices shown (1 of 2)]
[im 1/15]
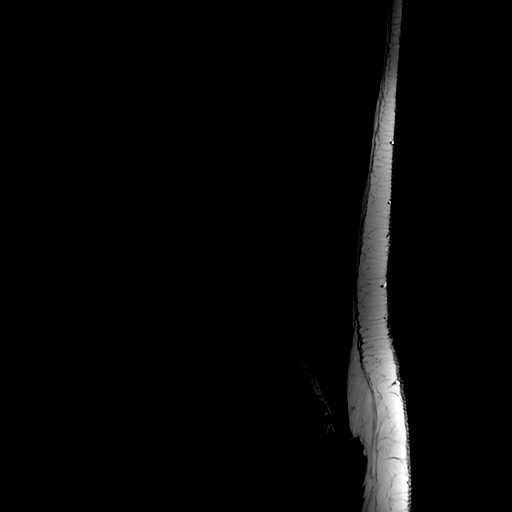
[im 4/15]
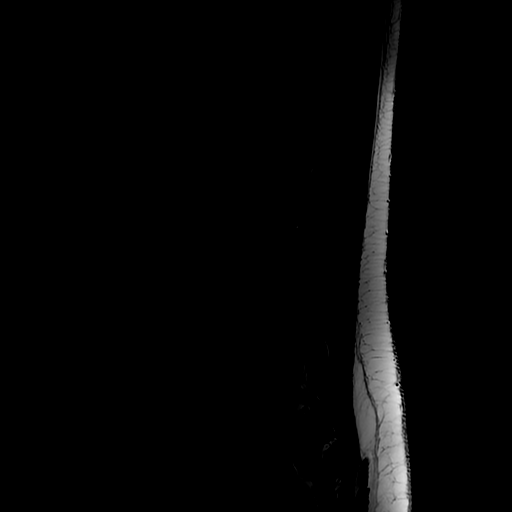
[im 8/15]
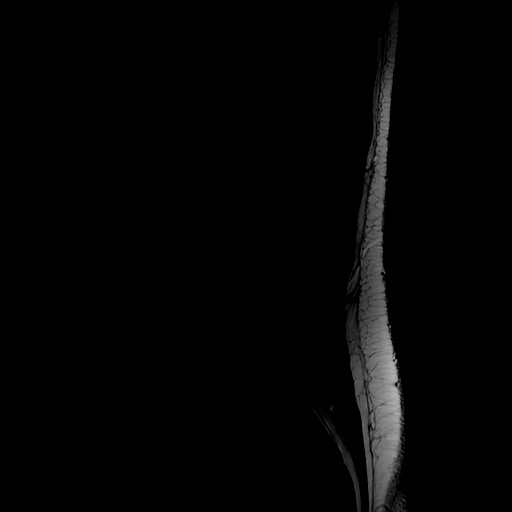
[im 11/15]
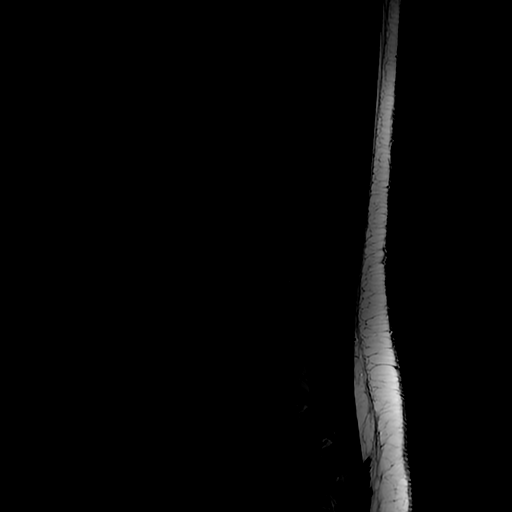
[im 15/15]
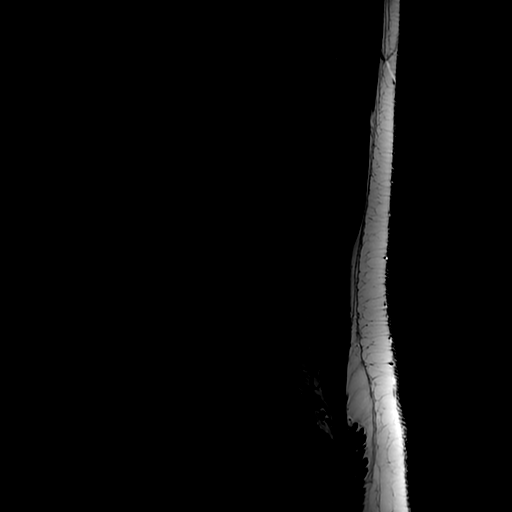

[Series 30: T1 · sagittal · 4.0mm · 0.55mm/px · 3 of 15 slices shown (1 of 2)]
[im 1/15]
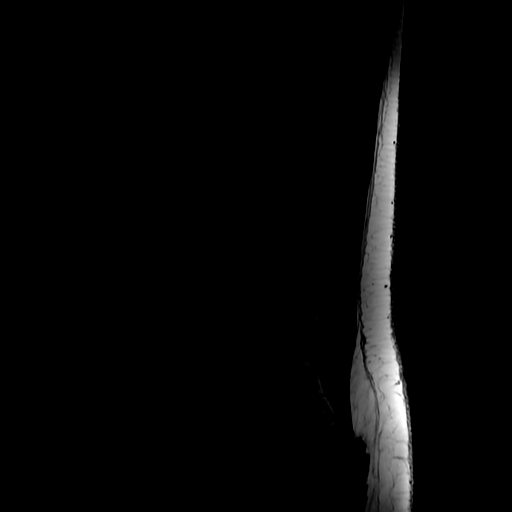
[im 10/15]
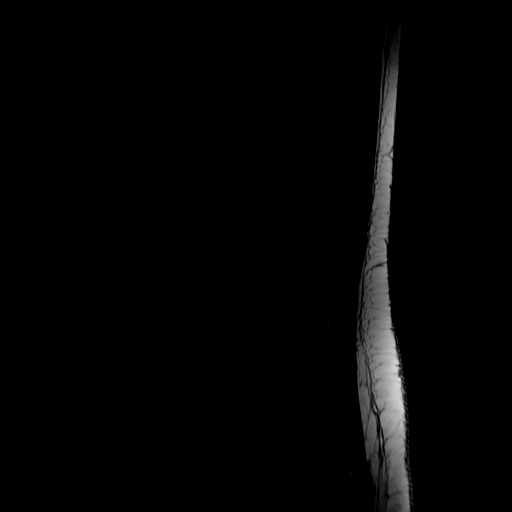
[im 15/15]
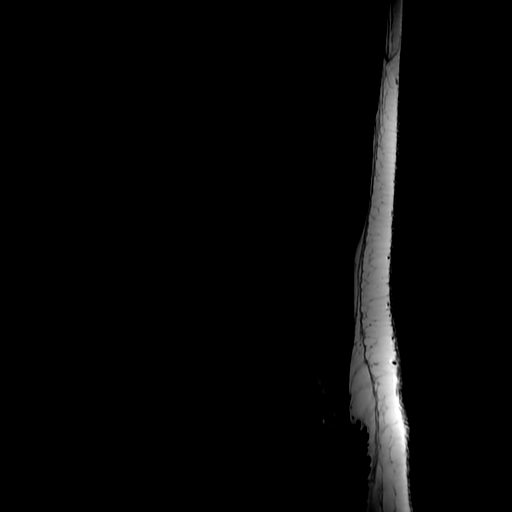

[Series 31: T2 · axial · 4.0mm · 0.39mm/px · z∈[-644,-457]mm · 7 of 38 slices shown (2 of 2)]
[im 1/38]
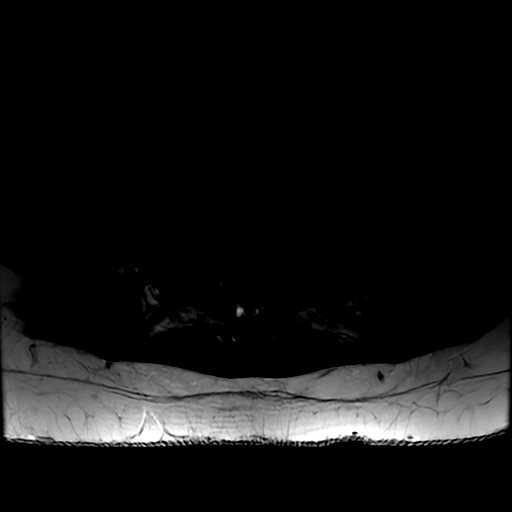
[im 5/38]
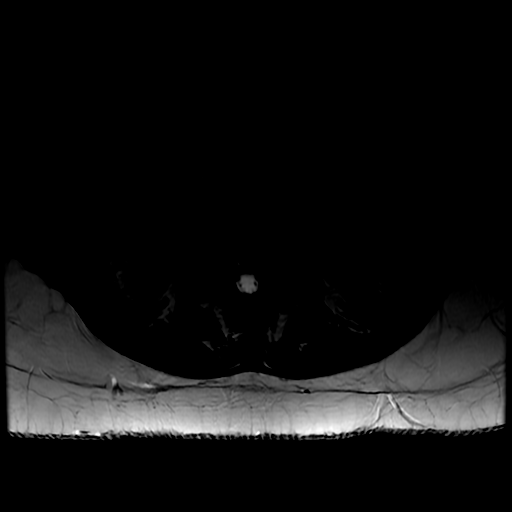
[im 13/38]
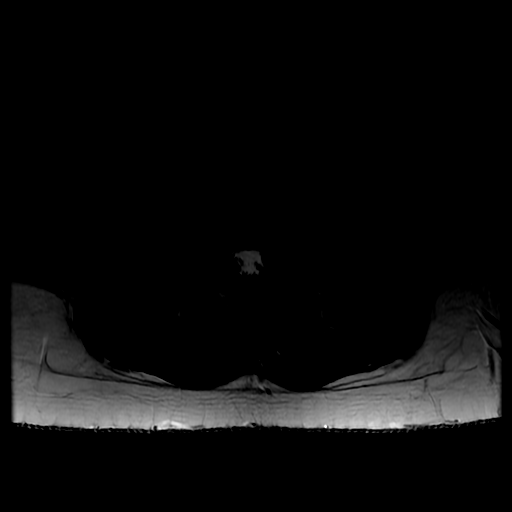
[im 17/38]
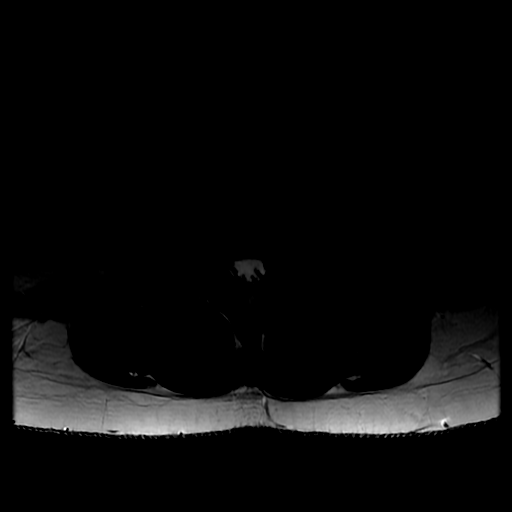
[im 21/38]
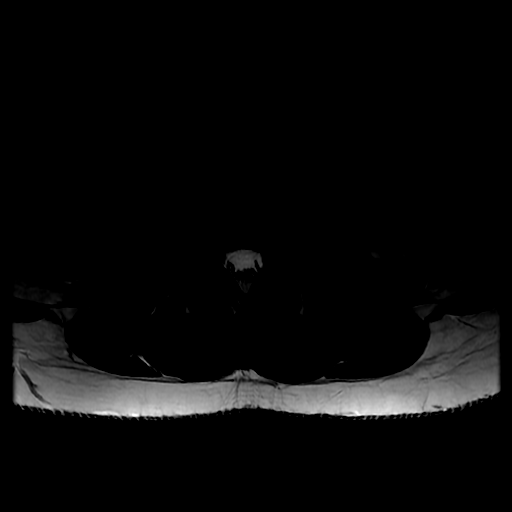
[im 25/38]
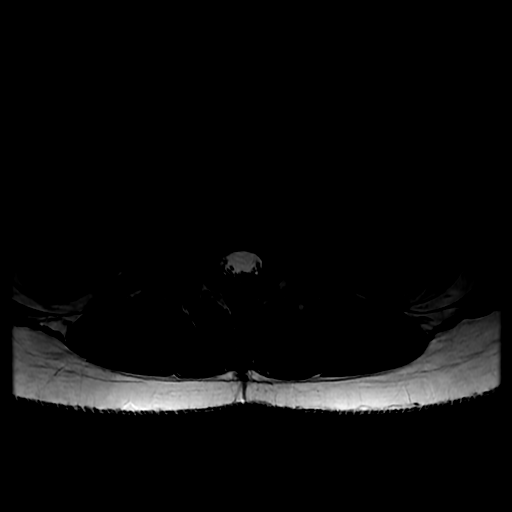
[im 33/38]
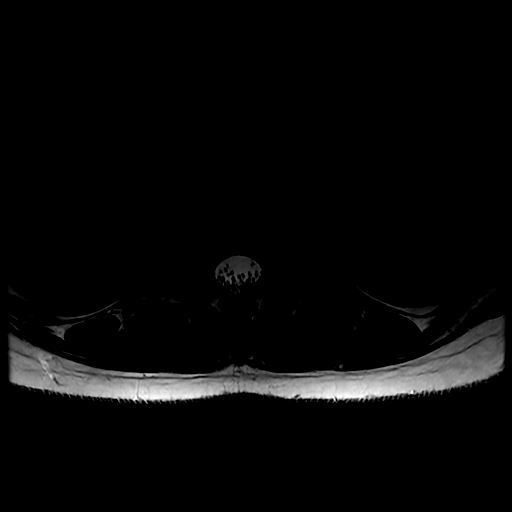

[Series 32: T1 · axial · 4.0mm · 0.39mm/px · z∈[-624,-457]mm · 3 of 38 slices shown (2 of 2)]
[im 5/38]
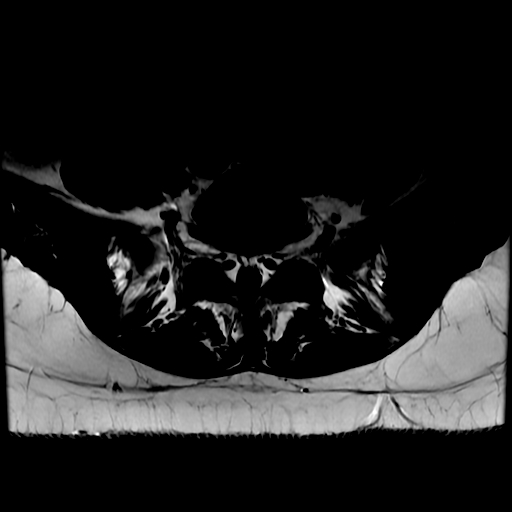
[im 21/38]
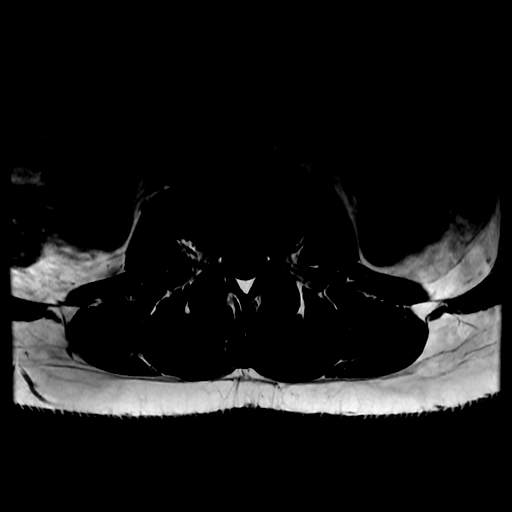
[im 33/38]
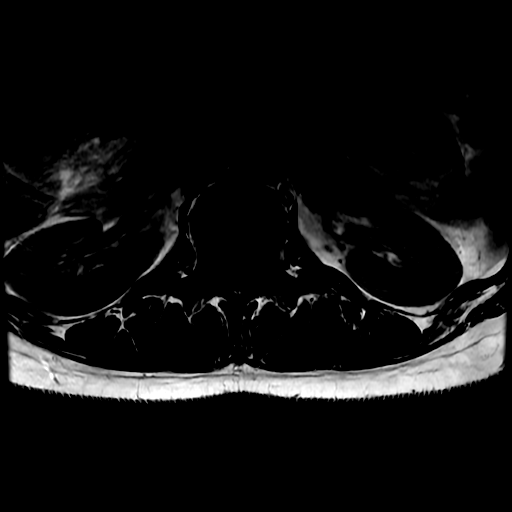

[18 of 48 positions shown; findings below may reference images not displayed]

FINDINGS: MRI HEAD

Brain: There is no acute infarction or intracranial hemorrhage.
There is no intracranial mass, mass effect, or edema. There is no
hydrocephalus or extra-axial fluid collection. Ventricles and sulci
are normal in size and configuration. No abnormal enhancement.

Vascular: Major vessel flow voids at the skull base are preserved.

Skull and upper cervical spine: Normal marrow signal is preserved.

Sinuses/Orbits: Minor mucosal thickening.  Orbits are unremarkable.

Other: Sella is unremarkable.  Mastoid air cells are clear.

MRI CERVICAL SPINE

Alignment: No significant listhesis.

Vertebrae: Vertebral body heights are maintained. No marrow edema.
No suspicious osseous lesion.

Cord: No abnormal signal or enhancement.

Posterior Fossa, vertebral arteries, paraspinal tissues:
Unremarkable.

Disc levels: No significant degenerative canal or foraminal
stenosis.

MRI THORACIC SPINE

Alignment:  Preserved.

Vertebrae: Vertebral body heights are maintained. No marrow edema.
No suspicious osseous lesion.

Cord:  No abnormal signal or enhancement.

Paraspinal and other soft tissues: Unremarkable.

Disc levels: Intervertebral disc heights and signal are maintained.
There is no degenerative canal or foraminal stenosis.

MRI LUMBAR SPINE

Alignment:  Preserved.

Vertebrae: Vertebral body heights are maintained. There is no marrow
edema. No suspicious osseous lesion.

Conus medullaris and cauda equina: The conus terminates at T12-L1.
Conus medullaris and cauda equina nerve roots are unremarkable.
There is no abnormal enhancement.

Paraspinal and other soft tissues: Unremarkable.

Disc levels: Intervertebral disc heights and signal are maintained.
There is no degenerative canal or foraminal stenosis.
IMPRESSION: No evidence of demyelinating disease. No mass or other significant
abnormality.

## 2021-04-06 MED ORDER — GADOBUTROL 1 MMOL/ML IV SOLN
6.0000 mL | Freq: Once | INTRAVENOUS | Status: AC | PRN
  Administered 2021-04-06: 6 mL via INTRAVENOUS

## 2021-04-06 NOTE — Discharge Instructions (Addendum)
Overall your MRI did not show any emergent cause of your weakness to your lower leg.  Recommend close follow-up with your primary care doctor for further evaluation.  You have also been referred to neurology for further outpatient work-up as well.  Use crutches as needed. ?

## 2021-04-06 NOTE — ED Notes (Signed)
[facility] getting pt at this time ?

## 2021-04-06 NOTE — ED Provider Notes (Signed)
Patient transferred from [facility] for MRI of her brain and spine.  Right lower leg weakness and numbness over the last 2 weeks.  No major medical problems.  Blood work done shows no significant anemia, electrolyte abnormality, kidney injury.  Patient denies any recent illness or travel.  Denies any symptoms like this in the past.  No history of MS.  States she has some back pain when this first started couple weeks ago but has not had any pain since then.  No problems going to the bathroom.  On exam patient denies any sensation in the right lower extremity.  Overall possibly effort dependent strength exam in the right lower extremity.  She has 5+ out of 5 strength in all other extremities.  Pulses are intact in her lower extremities.  There is no clonus.  Neurology recommends MRI.  MRI to help rule out MS, stroke, spinal mass, myelopathy. ? ?MRI imaging is unremarkable per my review of radiology report.  Suspect that this is a peripheral nerve process or could be conversion type disorder.  Neurology recommends outpatient follow-up with neurology and primary care doctor.  Patient given crutches.  Overall she appears well.  She is with family who is aware of the plan.  Discharged in good condition. ? ?This chart was dictated using voice recognition software.  Despite best efforts to proofread,  errors can occur which can change the documentation meaning.  ?  Virgina Norfolk, DO ?04/06/21 1101 ? ?

## 2021-04-06 NOTE — ED Provider Notes (Signed)
?[facility]-EMERGENCY DEPT ?[facility] Emergency Department ?Provider Note ?MRN:  [id number]  ?Arrival date & time: 04/06/21    ? ?Chief Complaint   ?leg numb ?  ?History of Present Illness   ?Sarah Campbell is a 21 y.o. year-old female with no pertinent past medical history presenting to the ED with chief complaint of leg numbness. ? ?2 weeks ago patient had some mild low back pain and was having some paresthesia to the right leg.  It has slowly progressed to numbness of the right leg.  Now she has complete lack of sensation to the right leg as well as loss of function.  She cannot raise the leg.  Denies any numbness or weakness to the arms, no vision changes, no headache, no other complaints.  Back pain has since resolved. ? ?Review of Systems  ?A thorough review of systems was obtained and all systems are negative except as noted in the HPI and PMH.  ? ?Patient's Health History   ?History reviewed. No pertinent past medical history.  ?History reviewed. No pertinent surgical history.  ?Family History  ?Problem Relation Age of Onset  ? Heart attack Paternal Grandmother   ? Diabetes Maternal Grandfather   ? Heart attack Maternal Grandfather   ? Alcoholism Father   ?  ?Social History  ? ?Socioeconomic History  ? Marital status: Single  ?  Spouse name: Not on file  ? Number of children: Not on file  ? Years of education: Not on file  ? Highest education level: Not on file  ?Occupational History  ? Not on file  ?Tobacco Use  ? Smoking status: Every Day  ?  Types: E-cigarettes  ? Smokeless tobacco: Never  ?Vaping Use  ? Vaping Use: Every day  ?Substance and Sexual Activity  ? Alcohol use: No  ? Drug use: No  ? Sexual activity: Yes  ?  Birth control/protection: None  ?Other Topics Concern  ? Not on file  ?Social History Narrative  ? Not on file  ? ?Social Determinants of Health  ? ?Financial Resource Strain: Low Risk   ? Difficulty of Paying Living Expenses: Not hard at all  ?Food Insecurity: No Food Insecurity  ?  Worried About Programme researcher, broadcasting/film/video in the Last Year: Never true  ? Ran Out of Food in the Last Year: Never true  ?Transportation Needs: No Transportation Needs  ? Lack of Transportation (Medical): No  ? Lack of Transportation (Non-Medical): No  ?Physical Activity: Sufficiently Active  ? Days of Exercise per Week: 7 days  ? Minutes of Exercise per Session: 60 min  ?Stress: Stress Concern Present  ? Feeling of Stress : To some extent  ?Social Connections: Moderately Isolated  ? Frequency of Communication with Friends and Family: More than three times a week  ? Frequency of Social Gatherings with Friends and Family: More than three times a week  ? Attends Religious Services: 1 to 4 times per year  ? Active Member of Clubs or Organizations: No  ? Attends Banker Meetings: Never  ? Marital Status: Never married  ?Intimate Partner Violence: Not At Risk  ? Fear of Current or Ex-Partner: No  ? Emotionally Abused: No  ? Physically Abused: No  ? Sexually Abused: No  ?  ? ?Physical Exam  ? ?Vitals:  ? 04/06/21 0030 04/06/21 0100  ?BP: 101/62 111/81  ?Pulse: 85 82  ?Resp: (!) 23 (!) 27  ?Temp:    ?SpO2: 99% 99%  ?  ?CONSTITUTIONAL:  Well-appearing, NAD ?NEURO/PSYCH:  Alert and oriented x 3, loss of strength and sensation to the right leg ?EYES:  eyes equal and reactive ?ENT/NECK:  no LAD, no JVD ?CARDIO: Regular rate, well-perfused, normal S1 and S2 ?PULM:  CTAB no wheezing or rhonchi ?GI/GU:  non-distended, non-tender ?MSK/SPINE:  No gross deformities, no edema ?SKIN:  no rash, atraumatic ? ? ?*Additional and/or pertinent findings included in MDM below ? ?Diagnostic and Interventional Summary  ? ? EKG Interpretation ? ?Date/Time:    ?Ventricular Rate:    ?PR Interval:    ?QRS Duration:   ?QT Interval:    ?QTC Calculation:   ?R Axis:     ?Text Interpretation:   ?  ? ?  ? ?Labs Reviewed  ?CBC  ?COMPREHENSIVE METABOLIC PANEL  ?  ?MR Brain W and Wo Contrast    (Results Pending)  ?MR CERVICAL SPINE W WO CONTRAST     (Results Pending)  ?MR THORACIC SPINE W WO CONTRAST    (Results Pending)  ?MR Lumbar Spine W Wo Contrast    (Results Pending)  ?  ?Medications - No data to display  ? ?Procedures  /  Critical Care ?.Critical Care ?Performed by: Sabas Sous, MD ?Authorized by: Sabas Sous, MD  ? ?Critical care provider statement:  ?  Critical care time (minutes):  35 ?  Critical care was necessary to treat or prevent imminent or life-threatening deterioration of the following conditions: Progressive neurological deficit. ?  Critical care was time spent personally by me on the following activities:  Development of treatment plan with patient or surrogate, discussions with consultants, evaluation of patient's response to treatment, examination of patient, ordering and review of laboratory studies, ordering and review of radiographic studies, ordering and performing treatments and interventions, pulse oximetry, re-evaluation of patient's condition and review of old charts ? ?ED Course and Medical Decision Making  ?Initial Impression and Ddx ?Differential diagnosis includes MS, stroke considered but felt to be unlikely.  Lumbar mass/lesion/myelopathy also considered.  Discussed case with Dr. Thomasena Edis of neurology, plan is to obtain MRI imaging of the CNS, to do this with transfer to [facility].  Dr. Allyne Gee is the accepting ED provider. ? ?Past medical/surgical history that increases complexity of ED encounter: None ? ?Interpretation of Diagnostics ?Labs pending ? ?Patient Reassessment and Ultimate Disposition/Management ?Transfer to [facility] ? ?Patient management required discussion with the following services or consulting groups:  Neurology ? ?Complexity of Problems Addressed ?Acute illness or injury that poses threat of life of bodily function ? ?Additional Data Reviewed and Analyzed ?Further history obtained from: ?None ? ?Additional Factors Impacting ED Encounter Risk ?Consideration of hospitalization ? ?Elmer Sow. Pilar Plate,  MD ?[facility] Emergency Medicine ?[facility] [email or web address] ? ?Final Clinical Impressions(s) / ED Diagnoses  ? ?  ICD-10-CM   ?1. Progressive neurological deficit  R29.818   ?  ?  ?ED Discharge Orders   ? ? None  ? ?  ?  ? ?Discharge Instructions Discussed with and Provided to Patient:  ? ?Discharge Instructions   ?None ?  ? ?  ?Sabas Sous, MD ?04/06/21 0113 ? ?

## 2021-04-06 NOTE — ED Notes (Signed)
Transported to MRI

## 2021-04-06 NOTE — ED Notes (Signed)
Lab at bedside

## 2021-04-06 NOTE — ED Notes (Signed)
Patient remains in MRI 

## 2021-04-06 NOTE — ED Notes (Signed)
Assisted up to bathroom using steady  ?

## 2021-04-10 DIAGNOSIS — M79604 Pain in right leg: Secondary | ICD-10-CM | POA: Diagnosis not present

## 2021-04-18 DIAGNOSIS — R29898 Other symptoms and signs involving the musculoskeletal system: Secondary | ICD-10-CM | POA: Diagnosis not present

## 2021-04-18 DIAGNOSIS — R200 Anesthesia of skin: Secondary | ICD-10-CM | POA: Diagnosis not present

## 2021-04-18 DIAGNOSIS — F447 Conversion disorder with mixed symptom presentation: Secondary | ICD-10-CM | POA: Diagnosis not present

## 2021-04-18 DIAGNOSIS — R202 Paresthesia of skin: Secondary | ICD-10-CM | POA: Diagnosis not present

## 2021-04-30 ENCOUNTER — Ambulatory Visit (INDEPENDENT_AMBULATORY_CARE_PROVIDER_SITE_OTHER): Payer: BC Managed Care – PPO | Admitting: *Deleted

## 2021-04-30 ENCOUNTER — Encounter: Payer: Self-pay | Admitting: *Deleted

## 2021-04-30 ENCOUNTER — Other Ambulatory Visit: Payer: Self-pay | Admitting: Adult Health

## 2021-04-30 VITALS — BP 123/81 | HR 121 | Ht 66.5 in | Wt 146.0 lb

## 2021-04-30 DIAGNOSIS — Z3201 Encounter for pregnancy test, result positive: Secondary | ICD-10-CM

## 2021-04-30 LAB — POCT URINE PREGNANCY: Preg Test, Ur: POSITIVE — AB

## 2021-04-30 MED ORDER — PRENATAL PLUS 27-1 MG PO TABS: 1.0000 | ORAL_TABLET | Freq: Every day | ORAL | 12 refills | Status: DC

## 2021-04-30 MED ORDER — PROMETHAZINE HCL 25 MG PO TABS: 25.0000 mg | ORAL_TABLET | Freq: Four times a day (QID) | ORAL | 1 refills | Status: DC | PRN

## 2021-04-30 NOTE — Progress Notes (Signed)
Rx PNV and phenergan ?

## 2021-04-30 NOTE — Progress Notes (Signed)
? ?  NURSE VISIT- PREGNANCY CONFIRMATION  ? ?SUBJECTIVE:  ?Sarah Campbell is a 21 y.o. G1P0000 female at [redacted]w[redacted]d by certain LMP of Patient's last menstrual period was 03/21/2021. Here for pregnancy confirmation.  Home pregnancy test: positive x 2.   She reports  nausea and cramping; breasts are sore .  She is not taking prenatal vitamins.   ? ?OBJECTIVE:  ?BP 123/81 (BP Location: Left Arm, Patient Position: Sitting, Cuff Size: Normal)   Pulse (!) 121   Ht 5' 6.5" (1.689 m)   Wt 146 lb (66.2 kg)   LMP 03/21/2021   BMI 23.21 kg/m?   ?Appears well, in no apparent distress ? ?Results for orders placed or performed in visit on 04/30/21 (from the past 24 hour(s))  ?POCT urine pregnancy  ? Collection Time: 04/30/21 10:02 AM  ?Result Value Ref Range  ? Preg Test, Ur Positive (A) Negative  ? ? ?ASSESSMENT: ?Positive pregnancy test, [redacted]w[redacted]d by LMP   ? ?PLAN: ?Schedule for dating ultrasound in 3 weeks ?Prenatal vitamins: note routed to [facility] to send prescription   ?Nausea medicines: requested-note routed to [facility] to send prescription   ?OB packet given: Yes ? ?Malachy Mood  ?04/30/2021 ?10:07 AM ? ?

## 2021-05-09 ENCOUNTER — Ambulatory Visit (HOSPITAL_COMMUNITY): Payer: BC Managed Care – PPO | Attending: Psychiatry | Admitting: Physical Therapy

## 2021-05-18 ENCOUNTER — Other Ambulatory Visit: Payer: Self-pay | Admitting: Obstetrics & Gynecology

## 2021-05-18 DIAGNOSIS — O3680X0 Pregnancy with inconclusive fetal viability, not applicable or unspecified: Secondary | ICD-10-CM

## 2021-05-21 ENCOUNTER — Ambulatory Visit (INDEPENDENT_AMBULATORY_CARE_PROVIDER_SITE_OTHER): Payer: BC Managed Care – PPO

## 2021-05-21 DIAGNOSIS — O3680X0 Pregnancy with inconclusive fetal viability, not applicable or unspecified: Secondary | ICD-10-CM | POA: Diagnosis not present

## 2021-05-21 NOTE — Progress Notes (Signed)
[place] 8+5 wks,single IUP with yolk sac,CRL 18.83 mm,normal ovaries,FHR 173 bpm ?

## 2021-05-28 ENCOUNTER — Telehealth: Payer: Self-pay | Admitting: Obstetrics & Gynecology

## 2021-05-28 NOTE — Telephone Encounter (Signed)
Patient has been throwing up constantly the last two days when she eats and her sugar is staying around 70. She wants to know what she can do to get it up Please advise.  ?

## 2021-05-28 NOTE — Telephone Encounter (Signed)
Patient has been throwing up every time she eats for the past two days and her sugar is staying at 70. She would like to know if there is anything she can do to get it to go back up. Please advise.  ?

## 2021-05-28 NOTE — Telephone Encounter (Signed)
Returned pt's call, two identifiers used. Pt confirmed that she had not been able to hold anything down, including water, for the past 2 days. She states she is still urinating clear urine and her mouth is moist. Pt taking Zofran, but not Phenergan due to drowsiness. Pt instructed to take a half dose of Phenergan in the late evening, continue taking Zofran, try drinking a few sips of water as tolerated, try ginger products, only eat bland foods if hungry. Pt instructed to seek care in ED if she stopped urinating, mouth is dry, or she felt very dizzy. Pt confirmed understanding. ?

## 2021-05-29 ENCOUNTER — Encounter: Payer: Self-pay | Admitting: Obstetrics & Gynecology

## 2021-06-11 ENCOUNTER — Ambulatory Visit (HOSPITAL_COMMUNITY): Payer: BC Managed Care – PPO | Attending: Psychiatry

## 2021-06-11 NOTE — Therapy (Incomplete)
?OUTPATIENT PHYSICAL THERAPY LOWER EXTREMITY EVALUATION ? ? ?Patient Name: Sarah Campbell ?MRN: [id number] ?DOB:2000/05/11, 21 y.o., female ?Today's Date: 06/11/2021 ? ? ? ?No past medical history on file. ?No past surgical history on file. ?Patient Active Problem List  ? Diagnosis Date Noted  ? Encounter for initial prescription of contraceptive pills 11/03/2020  ? Dysmenorrhea 10/24/2020  ? Menorrhagia with irregular cycle 10/24/2020  ? Pregnancy examination or test, negative result 10/24/2020  ? Pelvic pain 10/24/2020  ? Breakthrough bleeding on depo provera 07/26/2020  ? Depo-Provera contraceptive status 05/30/2020  ? Encounter for surveillance of injectable contraceptive 05/30/2020  ? Elevated lipase 05/30/2020  ? Screening examination for STD (sexually transmitted disease) 03/07/2020  ? General counseling and advice on contraceptive management 05/03/2019  ? ? ?PCP: *** ? ?REFERRING PROVIDER: *** ? ?REFERRING DIAG: *** ? ?THERAPY DIAG:  ?No diagnosis found. ? ?ONSET DATE: *** ? ?SUBJECTIVE:  ? ?SUBJECTIVE STATEMENT: ?*** ? ?PERTINENT HISTORY: ?*** ? ?PAIN:  ?Are you having pain? {OPRCPAIN:27236} ? ?PRECAUTIONS: {Therapy precautions:24002} ? ?WEIGHT BEARING RESTRICTIONS {Yes ***/No:24003} ? ?FALLS:  ?Has patient fallen in last 6 months? {fallsyesno:27318} ? ?LIVING ENVIRONMENT: ?Lives with: {OPRC lives with:25569::"lives with their family"} ?Lives in: {Lives in:25570} ?Stairs: {opstairs:27293} ?Has following equipment at home: {Assistive devices:23999} ? ?OCCUPATION: *** ? ?PLOF: {PLOF:24004} ? ?PATIENT GOALS *** ? ? ?OBJECTIVE:  ? ?DIAGNOSTIC FINDINGS: *** ? ?PATIENT SURVEYS:  ?{rehab surveys:24030} ? ?COGNITION: ? Overall cognitive status: {cognition:24006}   ?  ?SENSATION: ?{sensation:27233} ? ?MUSCLE LENGTH: ?Hamstrings: Right *** deg; Left *** deg ?Thomas test: Right *** deg; Left *** deg ? ?POSTURE:  ?*** ? ?PALPATION: ?*** ? ?LE ROM: ? ?{AROM/PROM:27142} ROM Right ?06/11/2021 Left ?06/11/2021  ?Hip flexion     ?Hip extension    ?Hip abduction    ?Hip adduction    ?Hip internal rotation    ?Hip external rotation    ?Knee flexion    ?Knee extension    ?Ankle dorsiflexion    ?Ankle plantarflexion    ?Ankle inversion    ?Ankle eversion    ? (Blank rows = not tested) ? ?LE MMT: ? ?MMT Right ?06/11/2021 Left ?06/11/2021  ?Hip flexion    ?Hip extension    ?Hip abduction    ?Hip adduction    ?Hip internal rotation    ?Hip external rotation    ?Knee flexion    ?Knee extension    ?Ankle dorsiflexion    ?Ankle plantarflexion    ?Ankle inversion    ?Ankle eversion    ? (Blank rows = not tested) ? ?LOWER EXTREMITY SPECIAL TESTS:  ?{LEspecialtests:26242} ? ?FUNCTIONAL TESTS:  ?{Functional tests:24029} ? ?GAIT: ?Distance walked: *** ?Assistive device utilized: {Assistive devices:23999} ?Level of assistance: {Levels of assistance:24026} ?Comments: *** ? ? ? ?TODAY'S TREATMENT: ?*** ? ? ?PATIENT EDUCATION:  ?Education details: *** ?Person educated: {Person educated:25204} ?Education method: {Education Method:25205} ?Education comprehension: {Education Comprehension:25206} ? ? ?HOME EXERCISE PROGRAM: ?*** ? ?ASSESSMENT: ? ?CLINICAL IMPRESSION: ?Patient is a *** y.o. *** who was seen today for physical therapy evaluation and treatment for ***.  ? ? ?OBJECTIVE IMPAIRMENTS {opptimpairments:25111}.  ? ?ACTIVITY LIMITATIONS {activity limitations:25113}.  ? ?PERSONAL FACTORS {Personal factors:25162} are also affecting patient's functional outcome.  ? ? ?REHAB POTENTIAL: {rehabpotential:25112} ? ?CLINICAL DECISION MAKING: {clinical decision making:25114} ? ?EVALUATION COMPLEXITY: {Evaluation complexity:25115} ? ? ?GOALS: ?Goals reviewed with patient? {yes/no:20286} ? ?SHORT TERM GOALS: Target date: {follow up:25551}  (Remove Blue Hyperlink) ? ?*** ?Baseline: ?Goal status: {GOALSTATUS:25110} ? ?2.  *** ?Baseline:  ?Goal status: {GOALSTATUS:25110} ? ?  3.  *** ?Baseline:  ?Goal status: {GOALSTATUS:25110} ? ?4.  *** ?Baseline:  ?Goal status:  {GOALSTATUS:25110} ? ?5.  *** ?Baseline:  ?Goal status: {GOALSTATUS:25110} ? ?6.  *** ?Baseline:  ?Goal status: {GOALSTATUS:25110} ? ?LONG TERM GOALS: Target date: {follow up:25551}  (Remove Blue Hyperlink) ? ?*** ?Baseline:  ?Goal status: {GOALSTATUS:25110} ? ?2.  *** ?Baseline:  ?Goal status: {GOALSTATUS:25110} ? ?3.  *** ?Baseline:  ?Goal status: {GOALSTATUS:25110} ? ?4.  *** ?Baseline:  ?Goal status: {GOALSTATUS:25110} ? ?5.  *** ?Baseline:  ?Goal status: {GOALSTATUS:25110} ? ?6.  *** ?Baseline:  ?Goal status: {GOALSTATUS:25110} ? ? ?PLAN: ?PT FREQUENCY: {rehab frequency:25116} ? ?PT DURATION: {rehab duration:25117} ? ?PLANNED INTERVENTIONS: {rehab planned interventions:25118::"Therapeutic exercises","Therapeutic activity","Neuromuscular re-education","Balance training","Gait training","Patient/Family education","Joint mobilization"} ? ?PLAN FOR NEXT SESSION: *** ? ? ?Marisue Brooklyn, PT ?06/11/2021, 1:06 PM ? ?

## 2021-06-14 ENCOUNTER — Other Ambulatory Visit: Payer: Self-pay | Admitting: Obstetrics & Gynecology

## 2021-06-14 DIAGNOSIS — Z3682 Encounter for antenatal screening for nuchal translucency: Secondary | ICD-10-CM

## 2021-06-15 DIAGNOSIS — O0990 Supervision of high risk pregnancy, unspecified, unspecified trimester: Secondary | ICD-10-CM | POA: Insufficient documentation

## 2021-06-15 DIAGNOSIS — Z3400 Encounter for supervision of normal first pregnancy, unspecified trimester: Secondary | ICD-10-CM | POA: Insufficient documentation

## 2021-06-15 DIAGNOSIS — O0993 Supervision of high risk pregnancy, unspecified, third trimester: Secondary | ICD-10-CM | POA: Insufficient documentation

## 2021-06-18 ENCOUNTER — Ambulatory Visit: Payer: BC Managed Care – PPO | Admitting: *Deleted

## 2021-06-18 ENCOUNTER — Ambulatory Visit (INDEPENDENT_AMBULATORY_CARE_PROVIDER_SITE_OTHER): Payer: BC Managed Care – PPO

## 2021-06-18 ENCOUNTER — Encounter: Payer: Self-pay | Admitting: Women's Health

## 2021-06-18 ENCOUNTER — Ambulatory Visit (INDEPENDENT_AMBULATORY_CARE_PROVIDER_SITE_OTHER): Payer: BC Managed Care – PPO | Admitting: Women's Health

## 2021-06-18 VITALS — BP 112/70 | HR 80 | Wt 145.0 lb

## 2021-06-18 DIAGNOSIS — Z3402 Encounter for supervision of normal first pregnancy, second trimester: Secondary | ICD-10-CM

## 2021-06-18 DIAGNOSIS — Z720 Tobacco use: Secondary | ICD-10-CM

## 2021-06-18 DIAGNOSIS — Z3A12 12 weeks gestation of pregnancy: Secondary | ICD-10-CM

## 2021-06-18 DIAGNOSIS — Z3682 Encounter for antenatal screening for nuchal translucency: Secondary | ICD-10-CM | POA: Diagnosis not present

## 2021-06-18 DIAGNOSIS — Z3401 Encounter for supervision of normal first pregnancy, first trimester: Secondary | ICD-10-CM

## 2021-06-18 DIAGNOSIS — Z31430 Encounter of female for testing for genetic disease carrier status for procreative management: Secondary | ICD-10-CM | POA: Diagnosis not present

## 2021-06-18 DIAGNOSIS — Z3481 Encounter for supervision of other normal pregnancy, first trimester: Secondary | ICD-10-CM | POA: Diagnosis not present

## 2021-06-18 LAB — POCT URINALYSIS DIPSTICK OB
Blood, UA: NEGATIVE
Glucose, UA: NEGATIVE
Ketones, UA: NEGATIVE
Leukocytes, UA: NEGATIVE
Nitrite, UA: NEGATIVE
POC,PROTEIN,UA: NEGATIVE

## 2021-06-18 MED ORDER — ONDANSETRON 4 MG PO TBDP
4.0000 mg | ORAL_TABLET | Freq: Three times a day (TID) | ORAL | 6 refills | Status: DC | PRN
Start: 2021-06-18 — End: 2021-08-20

## 2021-06-18 NOTE — Progress Notes (Signed)
INITIAL OBSTETRICAL VISIT Patient name: Sarah Campbell MRN [id number]  Date of birth: 2000-12-08 Chief Complaint:   Initial Prenatal Visit  History of Present Illness:   Sarah Campbell is a 21 y.o. G82P0000 Caucasian female at [redacted]w[redacted]d by LMP c/w u/s at 8 weeks with an Estimated Date of Delivery: 12/26/21 being seen today for her initial obstetrical visit.   Patient's last menstrual period was 03/21/2021. Her obstetrical history is significant for primigravida.   Today she reports  she saw white d/c, no itching/odor/irritation-discussed can be normal w/ hormonal changes- if any s/s infection let us know .  Vapes, has cut down some N/V, requests refill on zofran (has been taking since found out she was pregnant, originally rx'd by ED) Last pap 05/30/20. Results were: NILM w/ HRHPV not done     06/18/2021    2:30 PM 05/30/2020   10:43 AM 12/31/2017    1:40 PM 10/15/2017   12:01 PM  Depression screen PHQ 2/9  Decreased Interest 0 0 0 0  Down, Depressed, Hopeless 0 0 0 0  PHQ - 2 Score 0 0 0 0  Altered sleeping 2 1    Tired, decreased energy 0 1    Change in appetite 0 0    Feeling bad or failure about yourself  0 0    Trouble concentrating 0 0    Moving slowly or fidgety/restless 0 0    Suicidal thoughts 0 0    PHQ-9 Score 2 2          06/18/2021    2:30 PM 05/30/2020   10:43 AM  GAD 7 : Generalized Anxiety Score  Nervous, Anxious, on Edge 0 0  Control/stop worrying 0 0  Worry too much - different things 0 0  Trouble relaxing 0 1  Restless 0 0  Easily annoyed or irritable 0 0  Afraid - awful might happen 0 0  Total GAD 7 Score 0 1     Review of Systems:   Pertinent items are noted in HPI Denies cramping/contractions, leakage of fluid, vaginal bleeding, abnormal vaginal discharge w/ itching/odor/irritation, headaches, visual changes, shortness of breath, chest pain, abdominal pain, severe nausea/vomiting, or problems with urination or bowel movements unless otherwise stated  above.  Pertinent History Reviewed:  Reviewed past medical,surgical, social, obstetrical and family history.  Reviewed problem list, medications and allergies. OB History  Gravida Para Term Preterm AB Living  1 0 0 0 0 0  SAB IAB Ectopic Multiple Live Births  0 0 0 0 0    # Outcome Date GA Lbr Len/2nd Weight Sex Delivery Anes PTL Lv  1 Current            Physical Assessment:   Vitals:   06/18/21 1414  BP: 112/70  Pulse: 80  Weight: 145 lb (65.8 kg)  Body mass index is 23.05 kg/m.       Physical Examination:  General appearance - well appearing, and in no distress  Mental status - alert, oriented to person, place, and time  Psych:  She has a normal mood and affect  Skin - warm and dry, normal color, no suspicious lesions noted  Chest - effort normal, all lung fields clear to auscultation bilaterally  Heart - normal rate and regular rhythm  Abdomen - soft, nontender  Extremities:  No swelling or varicosities noted  Thin prep pap is not done   Chaperone: N/A    TODAY'S NT [place] 12+5 wks,measurements c/w dates,CRL 65.39  mm,FHR 161 bpm,NB present,NT 1.3 mm,normal ovaries,anterior placenta   Results for orders placed or performed in visit on 06/18/21 (from the past 24 hour(s))  POC Urinalysis Dipstick OB   Collection Time: 06/18/21  2:34 PM  Result Value Ref Range   Color, UA     Clarity, UA     Glucose, UA Negative Negative   Bilirubin, UA     Ketones, UA neg    Spec Grav, UA     Blood, UA neg    pH, UA     POC,PROTEIN,UA Negative Negative, Trace, Small (1+), Moderate (2+), Large (3+), 4+   Urobilinogen, UA     Nitrite, UA neg    Leukocytes, UA Negative Negative   Appearance     Odor      Assessment & Plan:  1) Low-Risk Pregnancy G1P0000 at [redacted]w[redacted]d with an Estimated Date of Delivery: 12/26/21   2) Initial OB visit  3) N/V> refilled zofran (taking whole pregnancy, rx'd by ED)  4) Vapes> has cut down some, advised cessation, offered QuitlineNC, declined right now,  wants to keep working on it herself  Meds:  Meds ordered this encounter  Medications   ondansetron (ZOFRAN-ODT) 4 MG disintegrating tablet    Sig: Take 1 tablet (4 mg total) by mouth every 8 (eight) hours as needed for nausea or vomiting.    Dispense:  20 tablet    Refill:  6    Order Specific Question:   Supervising Provider    Answer:   Duane Lope H [[id number]]    Initial labs obtained Continue prenatal vitamins Reviewed n/v relief measures and warning s/s to report Reviewed recommended weight gain based on pre-gravid BMI Encouraged well-balanced diet Genetic & carrier screening discussed: requests Panorama, NT/IT, and Horizon  Ultrasound discussed; fetal survey: requested CCNC completed> form faxed if has or is planning to apply for medicaid The nature of [facility] [employer] with multiple MDs and other Advanced Practice Providers was explained to patient; also emphasized that fellows, residents, and students are part of our team. Does have home bp cuff. Office bp cuff given: no. Rx sent: n/a. Check bp weekly, let us know if consistently >140/90.   Follow-up: Return in about 4 weeks (around 07/16/2021) for LROB, 2nd IT, CNM, in person.   Orders Placed This Encounter  Procedures   Urine Culture   GC/Chlamydia Probe Amp   Integrated 1   Genetic Screening   CBC/D/Plt+RPR+Rh+ABO+RubIgG...   POC Urinalysis Dipstick OB    Cheral Marker CNM, [facility] 06/18/2021 2:51 PM

## 2021-06-18 NOTE — Patient Instructions (Signed)
Sarah Campbell, thank you for choosing our office today! We appreciate the opportunity to meet your healthcare needs. You may receive a short survey by mail, e-mail, or through MyChart. If you are happy with your care we would appreciate if you could take just a few minutes to complete the survey questions. We read all of your comments and take your feedback very seriously. Thank you again for choosing our office.  [facility] ([address] [place], [place] [address]) Entrance C, located off of [address] Free 24/7 valet parking   Nausea & Vomiting Have saltine crackers or pretzels by your bed and eat a few bites before you raise your head out of bed in the morning Eat small frequent meals throughout the day instead of large meals Drink plenty of fluids throughout the day to stay hydrated, just don't drink a lot of fluids with your meals.  This can make your stomach fill up faster making you feel sick Do not brush your teeth right after you eat Products with real ginger are good for nausea, like ginger ale and ginger hard candy Make sure it says made with real ginger! Sucking on sour candy like lemon heads is also good for nausea If your prenatal vitamins make you nauseated, take them at night so you will sleep through the nausea Sea Bands If you feel like you need medicine for the nausea & vomiting please let us know If you are unable to keep any fluids or food down please let us know   Constipation Drink plenty of fluid, preferably water, throughout the day Eat foods high in fiber such as fruits, vegetables, and grains Exercise, such as walking, is a good way to keep your bowels regular Drink warm fluids, especially warm prune juice, or decaf coffee Eat a 1/2 cup of real oatmeal (not instant), 1/2 cup applesauce, and 1/2-1 cup warm prune juice every day If needed, you may take Colace (docusate sodium) stool softener  once or twice a day to help keep the stool soft.  If you still are having problems with constipation, you may take Miralax once daily as needed to help keep your bowels regular.   Home Blood Pressure Monitoring for Patients   Your provider has recommended that you check your blood pressure (BP) at least once a week at home. If you do not have a blood pressure cuff at home, one will be provided for you. Contact your provider if you have not received your monitor within 1 week.   Helpful Tips for Accurate Home Blood Pressure Checks  Don't smoke, exercise, or drink caffeine 30 minutes before checking your BP Use the restroom before checking your BP (a full bladder can raise your pressure) Relax in a comfortable upright chair Feet on the ground Left arm resting comfortably on a flat surface at the level of your heart Legs uncrossed Back supported Sit quietly and don't talk Place the cuff on your bare arm Adjust snuggly, so that only two fingertips can fit between your skin and the top of the cuff Check 2 readings separated by at least one minute Keep a log of your BP readings For a visual, please reference this diagram: http://ccnc.care/bpdiagram  Provider Name: [facility]     Phone: [phone]  Zone 1: ALL CLEAR  Continue to monitor your symptoms:  BP reading is less than 140 (top number) or less than 90 (bottom   number)  No right upper stomach pain No headaches or seeing spots No feeling nauseated or throwing up No swelling in face and hands  Zone 2: CAUTION Call your doctor's office for any of the following:  BP reading is greater than 140 (top number) or greater than 90 (bottom number)  Stomach pain under your ribs in the middle or right side Headaches or seeing spots Feeling nauseated or throwing up Swelling in face and hands  Zone 3: EMERGENCY  Seek immediate medical care if you have any of the following:  BP reading is greater than160 (top number) or greater than  110 (bottom number) Severe headaches not improving with Tylenol Serious difficulty catching your breath Any worsening symptoms from Zone 2    First Trimester of Pregnancy The first trimester of pregnancy is from week 1 until the end of week 12 (months 1 through 3). A week after a sperm fertilizes an egg, the egg will implant on the wall of the uterus. This embryo will begin to develop into a baby. Genes from you and your partner are forming the baby. The female genes determine whether the baby is a boy or a girl. At 6-8 weeks, the eyes and face are formed, and the heartbeat can be seen on ultrasound. At the end of 12 weeks, all the baby's organs are formed.  Now that you are pregnant, you will want to do everything you can to have a healthy baby. Two of the most important things are to get good prenatal care and to follow your health care provider's instructions. Prenatal care is all the medical care you receive before the baby's birth. This care will help prevent, find, and treat any problems during the pregnancy and childbirth. BODY CHANGES Your body goes through many changes during pregnancy. The changes vary from woman to woman.  You may gain or lose a couple of pounds at first. You may feel sick to your stomach (nauseous) and throw up (vomit). If the vomiting is uncontrollable, call your health care provider. You may tire easily. You may develop headaches that can be relieved by medicines approved by your health care provider. You may urinate more often. Painful urination may mean you have a bladder infection. You may develop heartburn as a result of your pregnancy. You may develop constipation because certain hormones are causing the muscles that push waste through your intestines to slow down. You may develop hemorrhoids or swollen, bulging veins (varicose veins). Your breasts may begin to grow larger and become tender. Your nipples may stick out more, and the tissue that surrounds them  (areola) may become darker. Your gums may bleed and may be sensitive to brushing and flossing. Dark spots or blotches (chloasma, mask of pregnancy) may develop on your face. This will likely fade after the baby is born. Your menstrual periods will stop. You may have a loss of appetite. You may develop cravings for certain kinds of food. You may have changes in your emotions from day to day, such as being excited to be pregnant or being concerned that something may go wrong with the pregnancy and baby. You may have more vivid and strange dreams. You may have changes in your hair. These can include thickening of your hair, rapid growth, and changes in texture. Some women also have hair loss during or after pregnancy, or hair that feels dry or thin. Your hair will most likely return to normal after your baby is born. WHAT TO EXPECT AT YOUR PRENATAL  VISITS During a routine prenatal visit: You will be weighed to make sure you and the baby are growing normally. Your blood pressure will be taken. Your abdomen will be measured to track your baby's growth. The fetal heartbeat will be listened to starting around week 10 or 12 of your pregnancy. Test results from any previous visits will be discussed. Your health care provider may ask you: How you are feeling. If you are feeling the baby move. If you have had any abnormal symptoms, such as leaking fluid, bleeding, severe headaches, or abdominal cramping. If you have any questions. Other tests that may be performed during your first trimester include: Blood tests to find your blood type and to check for the presence of any previous infections. They will also be used to check for low iron levels (anemia) and Rh antibodies. Later in the pregnancy, blood tests for diabetes will be done along with other tests if problems develop. Urine tests to check for infections, diabetes, or protein in the urine. An ultrasound to confirm the proper growth and development  of the baby. An amniocentesis to check for possible genetic problems. Fetal screens for spina bifida and Down syndrome. You may need other tests to make sure you and the baby are doing well. HOME CARE INSTRUCTIONS  Medicines Follow your health care provider's instructions regarding medicine use. Specific medicines may be either safe or unsafe to take during pregnancy. Take your prenatal vitamins as directed. If you develop constipation, try taking a stool softener if your health care provider approves. Diet Eat regular, well-balanced meals. Choose a variety of foods, such as meat or vegetable-based protein, fish, milk and low-fat dairy products, vegetables, fruits, and whole grain breads and cereals. Your health care provider will help you determine the amount of weight gain that is right for you. Avoid raw meat and uncooked cheese. These carry germs that can cause birth defects in the baby. Eating four or five small meals rather than three large meals a day may help relieve nausea and vomiting. If you start to feel nauseous, eating a few soda crackers can be helpful. Drinking liquids between meals instead of during meals also seems to help nausea and vomiting. If you develop constipation, eat more high-fiber foods, such as fresh vegetables or fruit and whole grains. Drink enough fluids to keep your urine clear or pale yellow. Activity and Exercise Exercise only as directed by your health care provider. Exercising will help you: Control your weight. Stay in shape. Be prepared for labor and delivery. Experiencing pain or cramping in the lower abdomen or low back is a good sign that you should stop exercising. Check with your health care provider before continuing normal exercises. Try to avoid standing for long periods of time. Move your legs often if you must stand in one place for a long time. Avoid heavy lifting. Wear low-heeled shoes, and practice good posture. You may continue to have sex  unless your health care provider directs you otherwise. Relief of Pain or Discomfort Wear a good support bra for breast tenderness.   Take warm sitz baths to soothe any pain or discomfort caused by hemorrhoids. Use hemorrhoid cream if your health care provider approves.   Rest with your legs elevated if you have leg cramps or low back pain. If you develop varicose veins in your legs, wear support hose. Elevate your feet for 15 minutes, 3-4 times a day. Limit salt in your diet. Prenatal Care Schedule your prenatal visits by the  twelfth week of pregnancy. They are usually scheduled monthly at first, then more often in the last 2 months before delivery. Write down your questions. Take them to your prenatal visits. Keep all your prenatal visits as directed by your health care provider. Safety Wear your seat belt at all times when driving. Make a list of emergency phone numbers, including numbers for family, friends, the hospital, and police and fire departments. General Tips Ask your health care provider for a referral to a local prenatal education class. Begin classes no later than at the beginning of month 6 of your pregnancy. Ask for help if you have counseling or nutritional needs during pregnancy. Your health care provider can offer advice or refer you to specialists for help with various needs. Do not use hot tubs, steam rooms, or saunas. Do not douche or use tampons or scented sanitary pads. Do not cross your legs for long periods of time. Avoid cat litter boxes and soil used by cats. These carry germs that can cause birth defects in the baby and possibly loss of the fetus by miscarriage or stillbirth. Avoid all smoking, herbs, alcohol, and medicines not prescribed by your health care provider. Chemicals in these affect the formation and growth of the baby. Schedule a dentist appointment. At home, brush your teeth with a soft toothbrush and be gentle when you floss. SEEK MEDICAL CARE IF:   You have dizziness. You have mild pelvic cramps, pelvic pressure, or nagging pain in the abdominal area. You have persistent nausea, vomiting, or diarrhea. You have a bad smelling vaginal discharge. You have pain with urination. You notice increased swelling in your face, hands, legs, or ankles. SEEK IMMEDIATE MEDICAL CARE IF:  You have a fever. You are leaking fluid from your vagina. You have spotting or bleeding from your vagina. You have severe abdominal cramping or pain. You have rapid weight gain or loss. You vomit blood or material that looks like coffee grounds. You are exposed to [place] measles and have never had them. You are exposed to fifth disease or chickenpox. You develop a severe headache. You have shortness of breath. You have any kind of trauma, such as from a fall or a car accident. Document Released: 01/08/2001 Document Revised: 05/31/2013 Document Reviewed: 11/24/2012 [facility] Patient Information 2015 [place], [place]. This information is not intended to replace advice given to you by your health care provider. Make sure you discuss any questions you have with your health care provider.

## 2021-06-18 NOTE — Progress Notes (Signed)
[place] 12+5 wks,measurements c/w dates,CRL 65.39 mm,FHR 161 bpm,NB present,NT 1.3 mm,normal ovaries,anterior placenta

## 2021-06-20 LAB — CBC/D/PLT+RPR+RH+ABO+RUBIGG...
Antibody Screen: NEGATIVE
Basophils Absolute: 0 10*3/uL (ref 0.0–0.2)
Basos: 0 %
EOS (ABSOLUTE): 0.1 10*3/uL (ref 0.0–0.4)
Eos: 1 %
HCV Ab: NONREACTIVE
HIV Screen 4th Generation wRfx: NONREACTIVE
Hematocrit: 36.4 % (ref 34.0–46.6)
Hemoglobin: 12.5 g/dL (ref 11.1–15.9)
Hepatitis B Surface Ag: NEGATIVE
Immature Grans (Abs): 0.1 10*3/uL (ref 0.0–0.1)
Immature Granulocytes: 1 %
Lymphocytes Absolute: 2.2 10*3/uL (ref 0.7–3.1)
Lymphs: 24 %
MCH: 30.1 pg (ref 26.6–33.0)
MCHC: 34.3 g/dL (ref 31.5–35.7)
MCV: 88 fL (ref 79–97)
Monocytes Absolute: 0.7 10*3/uL (ref 0.1–0.9)
Monocytes: 7 %
Neutrophils Absolute: 6.2 10*3/uL (ref 1.4–7.0)
Neutrophils: 67 %
Platelets: 317 10*3/uL (ref 150–450)
RBC: 4.15 x10E6/uL (ref 3.77–5.28)
RDW: 13.2 % (ref 11.7–15.4)
RPR Ser Ql: NONREACTIVE
Rh Factor: POSITIVE
Rubella Antibodies, IGG: 1.46 index (ref >0.99)
WBC: 9.2 10*3/uL (ref 3.4–10.8)

## 2021-06-20 LAB — HCV INTERPRETATION

## 2021-06-20 LAB — INTEGRATED 1
Crown Rump Length: 65.4 mm
Gest. Age on Collection Date: 12.7 weeks
Maternal Age at EDD: 22.6 yr
Nuchal Translucency (NT): 1.3 mm
Number of Fetuses: 1
PAPP-A Value: 1630.7 ng/mL
Weight: 145 [lb_av]

## 2021-06-20 LAB — GC/CHLAMYDIA PROBE AMP
Chlamydia trachomatis, NAA: NEGATIVE
Neisseria Gonorrhoeae by PCR: NEGATIVE

## 2021-06-20 LAB — URINE CULTURE

## 2021-07-09 DIAGNOSIS — O209 Hemorrhage in early pregnancy, unspecified: Secondary | ICD-10-CM | POA: Diagnosis not present

## 2021-07-09 DIAGNOSIS — Z3A15 15 weeks gestation of pregnancy: Secondary | ICD-10-CM | POA: Diagnosis not present

## 2021-07-09 DIAGNOSIS — R1030 Lower abdominal pain, unspecified: Secondary | ICD-10-CM | POA: Diagnosis not present

## 2021-07-09 DIAGNOSIS — R1031 Right lower quadrant pain: Secondary | ICD-10-CM | POA: Diagnosis not present

## 2021-07-09 DIAGNOSIS — O26892 Other specified pregnancy related conditions, second trimester: Secondary | ICD-10-CM | POA: Diagnosis not present

## 2021-07-09 DIAGNOSIS — O99891 Other specified diseases and conditions complicating pregnancy: Secondary | ICD-10-CM | POA: Diagnosis not present

## 2021-07-10 ENCOUNTER — Telehealth: Payer: Self-pay | Admitting: Family Medicine
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
## 2021-07-16 ENCOUNTER — Encounter: Payer: BC Managed Care – PPO | Admitting: Women's Health

## 2021-07-20 ENCOUNTER — Encounter: Payer: Self-pay | Admitting: Obstetrics and Gynecology

## 2021-07-20 ENCOUNTER — Ambulatory Visit (INDEPENDENT_AMBULATORY_CARE_PROVIDER_SITE_OTHER): Payer: BC Managed Care – PPO | Admitting: Obstetrics and Gynecology

## 2021-07-20 VITALS — BP 117/75 | HR 90 | Wt 147.4 lb

## 2021-07-20 DIAGNOSIS — Z3402 Encounter for supervision of normal first pregnancy, second trimester: Secondary | ICD-10-CM | POA: Diagnosis not present

## 2021-07-24 LAB — INTEGRATED 2
AFP MoM: 0.87
Alpha-Fetoprotein: 33.9 ng/mL
Crown Rump Length: 65.4 mm
DIA MoM: 2.01
DIA Value: 306.6 pg/mL
Estriol, Unconjugated: 1.27 ng/mL
Gest. Age on Collection Date: 12.7 weeks
Gestational Age: 17.3 weeks
Maternal Age at EDD: 22.6 yr
Nuchal Translucency (NT): 1.3 mm
Nuchal Translucency MoM: 0.89
Number of Fetuses: 1
PAPP-A MoM: 1.42
PAPP-A Value: 1630.7 ng/mL
Test Results:: NEGATIVE
Weight: 145 [lb_av]
Weight: 147 [lb_av]
hCG MoM: 2.87
hCG Value: 84.3 IU/mL
uE3 MoM: 1.04

## 2021-08-06 ENCOUNTER — Encounter (HOSPITAL_COMMUNITY): Payer: Self-pay

## 2021-08-06 ENCOUNTER — Other Ambulatory Visit: Payer: Self-pay

## 2021-08-06 ENCOUNTER — Emergency Department (HOSPITAL_COMMUNITY)
Admission: EM | Admit: 2021-08-06 | Discharge: 2021-08-06 | Disposition: A | Discharge status: Home / Self Care | Payer: Medicaid Other | Attending: Emergency Medicine | Admitting: Emergency Medicine

## 2021-08-06 DIAGNOSIS — I951 Orthostatic hypotension: Secondary | ICD-10-CM

## 2021-08-06 DIAGNOSIS — Z3A20 20 weeks gestation of pregnancy: Secondary | ICD-10-CM

## 2021-08-06 DIAGNOSIS — O26892 Other specified pregnancy related conditions, second trimester: Secondary | ICD-10-CM | POA: Diagnosis not present

## 2021-08-06 DIAGNOSIS — E860 Dehydration: Secondary | ICD-10-CM

## 2021-08-06 DIAGNOSIS — R42 Dizziness and giddiness: Secondary | ICD-10-CM | POA: Diagnosis present

## 2021-08-06 HISTORY — DX: Cardiac murmur, unspecified: R01.1

## 2021-08-06 LAB — URINALYSIS, ROUTINE W REFLEX MICROSCOPIC
Bilirubin Urine: NEGATIVE
Glucose, UA: NEGATIVE mg/dL
Hgb urine dipstick: NEGATIVE
Ketones, ur: NEGATIVE mg/dL
Nitrite: NEGATIVE
Protein, ur: NEGATIVE mg/dL
Specific Gravity, Urine: 1.019 (ref 1.005–1.030)
pH: 5 (ref 5.0–8.0)

## 2021-08-06 LAB — COMPREHENSIVE METABOLIC PANEL
ALT: 12 U/L (ref 0–44)
AST: 16 U/L (ref 15–41)
Albumin: 3 g/dL — ABNORMAL LOW (ref 3.5–5.0)
Alkaline Phosphatase: 82 U/L (ref 38–126)
Anion gap: 5 (ref 5–15)
BUN: 10 mg/dL (ref 6–20)
CO2: 21 mmol/L — ABNORMAL LOW (ref 22–32)
Calcium: 8.5 mg/dL — ABNORMAL LOW (ref 8.9–10.3)
Chloride: 108 mmol/L (ref 98–111)
Creatinine, Ser: 0.65 mg/dL (ref 0.44–1.00)
GFR, Estimated: >60 mL/min (ref >60)
Glucose, Bld: 103 mg/dL — ABNORMAL HIGH (ref 70–99)
Potassium: 3.4 mmol/L — ABNORMAL LOW (ref 3.5–5.1)
Sodium: 134 mmol/L — ABNORMAL LOW (ref 135–145)
Total Bilirubin: 0.5 mg/dL (ref 0.3–1.2)
Total Protein: 6.3 g/dL — ABNORMAL LOW (ref 6.5–8.1)

## 2021-08-06 LAB — CBC WITH DIFFERENTIAL/PLATELET
Abs Immature Granulocytes: 0.05 10*3/uL (ref 0.00–0.07)
Basophils Absolute: 0 10*3/uL (ref 0.0–0.1)
Basophils Relative: 0 %
Eosinophils Absolute: 0 10*3/uL (ref 0.0–0.5)
Eosinophils Relative: 0 %
HCT: 33.7 % — ABNORMAL LOW (ref 36.0–46.0)
Hemoglobin: 11.4 g/dL — ABNORMAL LOW (ref 12.0–15.0)
Immature Granulocytes: 1 %
Lymphocytes Relative: 16 %
Lymphs Abs: 1.6 10*3/uL (ref 0.7–4.0)
MCH: 30.2 pg (ref 26.0–34.0)
MCHC: 33.8 g/dL (ref 30.0–36.0)
MCV: 89.4 fL (ref 80.0–100.0)
Monocytes Absolute: 0.6 10*3/uL (ref 0.1–1.0)
Monocytes Relative: 6 %
Neutro Abs: 7.8 10*3/uL — ABNORMAL HIGH (ref 1.7–7.7)
Neutrophils Relative %: 77 %
Platelets: 273 10*3/uL (ref 150–400)
RBC: 3.77 MIL/uL — ABNORMAL LOW (ref 3.87–5.11)
RDW: 13.4 % (ref 11.5–15.5)
WBC: 10 10*3/uL (ref 4.0–10.5)
nRBC: 0 % (ref 0.0–0.2)

## 2021-08-06 MED ORDER — SODIUM CHLORIDE 0.9 % IV BOLUS
1000.0000 mL | Freq: Once | INTRAVENOUS | Status: AC
  Administered 2021-08-06: 1000 mL via INTRAVENOUS

## 2021-08-06 NOTE — ED Provider Notes (Signed)
[facility] EMERGENCY DEPARTMENT Provider Note   CSN: [id number] Arrival date & time: 08/06/21  0744     History  Chief Complaint  Patient presents with   Dizziness    Sarah Campbell is a 21 y.o. female.  Pt is a 21 yo female with no significant pmhx.  She is [redacted] weeks pregnant.  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  She did not feel well when she got up, but came to work anyway.  She felt dizzy with standing and her HR went up.  She is feeling the baby move and she's not having any vaginal bleeding or any pain. No h/a.  No leg swelling.       Home Medications Prior to Admission medications   Medication Sig Start Date End Date Taking? Authorizing Provider  ondansetron (ZOFRAN-ODT) 4 MG disintegrating tablet Take 1 tablet (4 mg total) by mouth every 8 (eight) hours as needed for nausea or vomiting. 06/18/21  Yes Cheral Marker, CNM  prenatal vitamin w/FE, FA (PRENATAL 1 + 1) 27-1 MG TABS tablet Take 1 tablet by mouth daily at 12 noon. 04/30/21  Yes [name], NP  omeprazole (PRILOSEC) 20 MG capsule Take 1 capsule (20 mg total) by mouth 2 (two) times daily. 06/24/18 08/10/18  Rennis Harding, PA-C      Allergies    Patient has no known allergies.    Review of Systems   Review of Systems  Neurological:  Positive for light-headedness.  All other systems reviewed and are negative.   Physical Exam Updated Vital Signs BP 96/62   Pulse 64   Temp 98.4 F (36.9 C) (Oral)   Resp 20   Ht 5' 6.5" (1.689 m)   Wt 66.7 kg   LMP 03/21/2021   SpO2 100%   BMI 23.37 kg/m  Physical Exam Vitals and nursing note reviewed.  Constitutional:      Appearance: Normal appearance.  HENT:     Head: Normocephalic and atraumatic.     Right Ear: External ear normal.     Left Ear: External ear normal.     Nose: Nose normal.     Mouth/Throat:     Mouth: Mucous membranes are dry.  Eyes:     Extraocular Movements: Extraocular movements intact.     Conjunctiva/sclera: Conjunctivae  normal.     Pupils: Pupils are equal, round, and reactive to light.  Cardiovascular:     Rate and Rhythm: Normal rate and regular rhythm.     Pulses: Normal pulses.     Heart sounds: Normal heart sounds.  Pulmonary:     Effort: Pulmonary effort is normal.     Breath sounds: Normal breath sounds.  Abdominal:     General: Abdomen is flat. Bowel sounds are normal.     Palpations: Abdomen is soft.  Musculoskeletal:        General: Normal range of motion.     Cervical back: Normal range of motion and neck supple.  Skin:    General: Skin is warm.     Capillary Refill: Capillary refill takes less than 2 seconds.  Neurological:     General: No focal deficit present.     Mental Status: She is alert and oriented to person, place, and time.  Psychiatric:        Mood and Affect: Mood normal.        Behavior: Behavior normal.     ED Results / Procedures / Treatments   Labs (all  labs ordered are listed, but only abnormal results are displayed) Labs Reviewed  COMPREHENSIVE METABOLIC PANEL - Abnormal; Notable for the following components:      Result Value   Sodium 134 (*)    Potassium 3.4 (*)    CO2 21 (*)    Glucose, Bld 103 (*)    Calcium 8.5 (*)    Total Protein 6.3 (*)    Albumin 3.0 (*)    All other components within normal limits  CBC WITH DIFFERENTIAL/PLATELET - Abnormal; Notable for the following components:   RBC 3.77 (*)    Hemoglobin 11.4 (*)    HCT 33.7 (*)    Neutro Abs 7.8 (*)    All other components within normal limits  URINALYSIS, ROUTINE W REFLEX MICROSCOPIC - Abnormal; Notable for the following components:   APPearance HAZY (*)    Leukocytes,Ua TRACE (*)    Bacteria, UA RARE (*)    All other components within normal limits    EKG None  Radiology No results found.  Procedures Procedures    Medications Ordered in ED Medications  sodium chloride 0.9 % bolus 1,000 mL (0 mLs Intravenous Stopped 08/06/21 0930)  sodium chloride 0.9 % bolus 1,000 mL  (1,000 mLs Intravenous New Bag/Given 08/06/21 1026)    ED Course/ Medical Decision Making/ A&P                           Medical Decision Making Amount and/or Complexity of Data Reviewed Labs: ordered.   This patient presents to the ED for concern of lightheadedness, this involves an extensive number of treatment options, and is a complaint that carries with it a high risk of complications and morbidity.  The differential diagnosis includes orthostasis, infection, pregnancy problem   Co morbidities that complicate the patient evaluation  20 wk preg   Additional history obtained:  Additional history obtained from epic chart review   Lab Tests:  I Ordered, and personally interpreted labs.  The pertinent results include:  ua nl, cmp nl, cbc 11.4    Cardiac Monitoring:  The patient was maintained on a cardiac monitor.  I personally viewed and interpreted the cardiac monitored which showed an underlying rhythm of: nsr   Medicines ordered and prescription drug management:  I ordered medication including ivfs  for dehydration  Reevaluation of the patient after these medicines showed that the patient improved I have reviewed the patients home medicines and have made adjustments as needed    Critical Interventions:  ivfs   Problem List / ED Course:  Dizziness:  likely due to orthostatic hypotension.  Pt given 2L IVFs and is feeling much better.  She is able to ambulate without problems.  Bedside US showed good fetal mvmt with good fetal heart tones.   Reevaluation:  After the interventions noted above, I reevaluated the patient and found that they have :improved   Social Determinants of Health:  Lives at home   Dispostion:  After consideration of the diagnostic results and the patients response to treatment, I feel that the patent would benefit from discharge with outpatient f/u.          Final Clinical Impression(s) / ED Diagnoses Final diagnoses:   Dehydration  Orthostatic hypotension  [redacted] weeks gestation of pregnancy    Rx / DC Orders ED Discharge Orders     None         Jacalyn Lefevre, MD 08/06/21 1125

## 2021-08-06 NOTE — ED Triage Notes (Signed)
Pt was working and c/o dizziness and checked her vitals and her HR went from 112 to 140. Pt is [redacted] weeks pregnant.

## 2021-08-16 ENCOUNTER — Other Ambulatory Visit: Payer: Self-pay

## 2021-08-16 ENCOUNTER — Inpatient Hospital Stay (HOSPITAL_COMMUNITY)
Admission: AD | Admit: 2021-08-16 | Discharge: 2021-08-20 | DRG: 833 | Disposition: A | Discharge status: Home / Self Care | Payer: Commercial Managed Care - HMO | Attending: Obstetrics and Gynecology | Admitting: Obstetrics and Gynecology

## 2021-08-16 ENCOUNTER — Encounter (HOSPITAL_COMMUNITY): Payer: Self-pay | Admitting: Obstetrics and Gynecology

## 2021-08-16 ENCOUNTER — Other Ambulatory Visit: Payer: Self-pay | Admitting: Obstetrics and Gynecology

## 2021-08-16 ENCOUNTER — Ambulatory Visit (INDEPENDENT_AMBULATORY_CARE_PROVIDER_SITE_OTHER): Payer: Medicaid Other | Admitting: Advanced Practice Midwife

## 2021-08-16 VITALS — BP 108/81 | HR 133 | Temp 99.4°F | Wt 149.0 lb

## 2021-08-16 DIAGNOSIS — F17290 Nicotine dependence, other tobacco product, uncomplicated: Secondary | ICD-10-CM | POA: Diagnosis present

## 2021-08-16 DIAGNOSIS — Z3A21 21 weeks gestation of pregnancy: Secondary | ICD-10-CM | POA: Diagnosis not present

## 2021-08-16 DIAGNOSIS — R300 Dysuria: Secondary | ICD-10-CM

## 2021-08-16 DIAGNOSIS — O99332 Smoking (tobacco) complicating pregnancy, second trimester: Secondary | ICD-10-CM | POA: Diagnosis present

## 2021-08-16 DIAGNOSIS — O2300 Infections of kidney in pregnancy, unspecified trimester: Secondary | ICD-10-CM | POA: Diagnosis present

## 2021-08-16 DIAGNOSIS — O2302 Infections of kidney in pregnancy, second trimester: Secondary | ICD-10-CM | POA: Diagnosis not present

## 2021-08-16 DIAGNOSIS — Z3402 Encounter for supervision of normal first pregnancy, second trimester: Secondary | ICD-10-CM

## 2021-08-16 DIAGNOSIS — Z363 Encounter for antenatal screening for malformations: Secondary | ICD-10-CM

## 2021-08-16 DIAGNOSIS — O26892 Other specified pregnancy related conditions, second trimester: Secondary | ICD-10-CM

## 2021-08-16 LAB — POCT URINALYSIS DIPSTICK
Bilirubin, UA: NEGATIVE
Glucose, UA: NEGATIVE
Ketones, UA: 160
Nitrite, UA: POSITIVE
Protein, UA: POSITIVE — AB
Spec Grav, UA: <=1.005 — AB (ref 1.010–1.025)
Urobilinogen, UA: 0.2 E.U./dL
pH, UA: 5 (ref 5.0–8.0)

## 2021-08-16 LAB — CBC
HCT: 29.9 % — ABNORMAL LOW (ref 36.0–46.0)
Hemoglobin: 10.3 g/dL — ABNORMAL LOW (ref 12.0–15.0)
MCH: 30.4 pg (ref 26.0–34.0)
MCHC: 34.4 g/dL (ref 30.0–36.0)
MCV: 88.2 fL (ref 80.0–100.0)
Platelets: 254 10*3/uL (ref 150–400)
RBC: 3.39 MIL/uL — ABNORMAL LOW (ref 3.87–5.11)
RDW: 13.6 % (ref 11.5–15.5)
WBC: 20.6 10*3/uL — ABNORMAL HIGH (ref 4.0–10.5)
nRBC: 0 % (ref 0.0–0.2)

## 2021-08-16 LAB — COMPREHENSIVE METABOLIC PANEL
ALT: 10 U/L (ref 0–44)
AST: 15 U/L (ref 15–41)
Albumin: 2.8 g/dL — ABNORMAL LOW (ref 3.5–5.0)
Alkaline Phosphatase: 96 U/L (ref 38–126)
Anion gap: 11 (ref 5–15)
BUN: 7 mg/dL (ref 6–20)
CO2: 21 mmol/L — ABNORMAL LOW (ref 22–32)
Calcium: 8.6 mg/dL — ABNORMAL LOW (ref 8.9–10.3)
Chloride: 105 mmol/L (ref 98–111)
Creatinine, Ser: 0.73 mg/dL (ref 0.44–1.00)
GFR, Estimated: >60 mL/min (ref >60)
Glucose, Bld: 74 mg/dL (ref 70–99)
Potassium: 3.4 mmol/L — ABNORMAL LOW (ref 3.5–5.1)
Sodium: 137 mmol/L (ref 135–145)
Total Bilirubin: 0.6 mg/dL (ref 0.3–1.2)
Total Protein: 6.1 g/dL — ABNORMAL LOW (ref 6.5–8.1)

## 2021-08-16 LAB — TYPE AND SCREEN
ABO/RH(D): O POS
Antibody Screen: NEGATIVE

## 2021-08-16 MED ORDER — IBUPROFEN 600 MG PO TABS
600.0000 mg | ORAL_TABLET | Freq: Four times a day (QID) | ORAL | Status: DC | PRN
  Administered 2021-08-18 – 2021-08-19 (×2): 600 mg via ORAL
  Filled 2021-08-16 (×2): qty 1

## 2021-08-16 MED ORDER — KETOROLAC TROMETHAMINE 30 MG/ML IJ SOLN
30.0000 mg | Freq: Four times a day (QID) | INTRAMUSCULAR | Status: AC
  Administered 2021-08-16 – 2021-08-17 (×4): 30 mg via INTRAVENOUS
  Filled 2021-08-16 (×5): qty 1

## 2021-08-16 MED ORDER — DOCUSATE SODIUM 100 MG PO CAPS
100.0000 mg | ORAL_CAPSULE | Freq: Every day | ORAL | Status: DC
  Administered 2021-08-16 – 2021-08-20 (×5): 100 mg via ORAL
  Filled 2021-08-16 (×5): qty 1

## 2021-08-16 MED ORDER — ACETAMINOPHEN 325 MG PO TABS
650.0000 mg | ORAL_TABLET | ORAL | Status: DC | PRN
  Administered 2021-08-17 – 2021-08-18 (×2): 650 mg via ORAL
  Filled 2021-08-16 (×2): qty 2

## 2021-08-16 MED ORDER — CALCIUM CARBONATE ANTACID 500 MG PO CHEW: 2.0000 | CHEWABLE_TABLET | ORAL | Status: DC | PRN

## 2021-08-16 MED ORDER — OXYCODONE-ACETAMINOPHEN 5-325 MG PO TABS
1.0000 | ORAL_TABLET | Freq: Four times a day (QID) | ORAL | Status: DC | PRN
  Administered 2021-08-16 – 2021-08-19 (×4): 1 via ORAL
  Filled 2021-08-16 (×4): qty 1

## 2021-08-16 MED ORDER — LACTATED RINGERS IV SOLN: INTRAVENOUS | Status: DC

## 2021-08-16 MED ORDER — SODIUM CHLORIDE 0.9 % IV SOLN
2.0000 g | INTRAVENOUS | Status: DC
  Administered 2021-08-16 – 2021-08-19 (×4): 2 g via INTRAVENOUS
  Filled 2021-08-16 (×4): qty 20

## 2021-08-16 MED ORDER — PRENATAL MULTIVITAMIN CH
1.0000 | ORAL_TABLET | Freq: Every day | ORAL | Status: DC
  Administered 2021-08-17 – 2021-08-20 (×4): 1 via ORAL
  Filled 2021-08-16 (×4): qty 1

## 2021-08-16 MED ORDER — ZOLPIDEM TARTRATE 5 MG PO TABS: 5.0000 mg | ORAL_TABLET | Freq: Every evening | ORAL | Status: DC | PRN

## 2021-08-16 NOTE — H&P (Signed)
FACULTY PRACTICE ANTEPARTUM ADMISSION HISTORY AND PHYSICAL NOTE   History of Present Illness: Sarah Campbell is a 21 y.o. G1P0000 at [redacted]w[redacted]d admitted for pyelonephritis.     Pt started having fever and chills last evening. Seen at [facility] today with exam consistent and suspicious for pyelonephritis.   Denies vaginal bleeding, LOF or uterine contractions.  + FM. Prenatal care has been unremarkable to this point.   Pt being admitted for IV antibiotics.    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  OB History  Gravida Para Term Preterm AB Living  1 0 0 0 0 0  SAB IAB Ectopic Multiple Live Births  0 0 0 0 0    # Outcome Date GA Lbr Len/2nd Weight Sex Delivery Anes PTL Lv  1 Current             Social History   Socioeconomic History   Marital status: Single    Spouse name: Not on file   Number of children: Not on file   Years of education: Not on file   Highest education level: Not on file  Occupational History   Not on file  Tobacco Use   Smoking status: Every Day    Types: E-cigarettes   Smokeless tobacco: Never  Vaping Use   Vaping Use: Every day  Substance and Sexual Activity   Alcohol use: No   Drug use: No   Sexual activity: Yes    Birth control/protection: None  Other Topics Concern   Not on file  Social History Narrative   Not on file   Social Determinants of Health   Financial Resource Strain: Low Risk  (06/18/2021)   Overall Financial Resource Strain (CARDIA)    Difficulty of Paying Living Expenses: Not hard at all  Food Insecurity: No Food Insecurity (06/18/2021)   Hunger Vital Sign    Worried About Running Out of Food in the Last Year: Never true    Ran Out  of Food in the Last Year: Never true  Transportation Needs: No Transportation Needs (06/18/2021)   PRAPARE - Administrator, Civil Service (Medical): No    Lack of Transportation (Non-Medical): No  Physical Activity: Insufficiently Active (06/18/2021)   Exercise Vital Sign    Days of Exercise per Week: 1 day    Minutes of Exercise per Session: 30 min  Stress: No Stress Concern Present (06/18/2021)   [employer] of Occupational Health - Occupational Stress Questionnaire    Feeling of Stress : Only a little  Social Connections: Socially Isolated (06/18/2021)   Social Connection and Isolation Panel [NHANES]    Frequency of Communication with Friends and Family: More than three times a week    Frequency of Social Gatherings with Friends and Family: More than three times a week    Attends Religious Services: Never    Database administrator or Organizations: No    Attends Engineer, structural: Never    Marital Status: Never married    Family History  Problem Relation Age of Onset   Alcoholism Father    Diabetes Maternal Grandfather    Heart attack Maternal Grandfather    Heart attack Paternal  Grandmother     No Known Allergies  Medications Prior to Admission  Medication Sig Dispense Refill Last Dose   ondansetron (ZOFRAN-ODT) 4 MG disintegrating tablet Take 1 tablet (4 mg total) by mouth every 8 (eight) hours as needed for nausea or vomiting. 20 tablet 6    prenatal vitamin w/FE, FA (PRENATAL 1 + 1) 27-1 MG TABS tablet Take 1 tablet by mouth daily at 12 noon. 30 tablet 12     Review of Systems - negative except as noted in HPI  Vitals:  BP (!) 111/58 (BP Location: Left Arm)   Temp 97.8 F (36.6 C) (Oral)   Resp 17   LMP 03/21/2021   SpO2 100%  Physical Examination: CONSTITUTIONAL: Well-developed, well-nourished female in no acute distress.  HENT:  Normocephalic, atraumatic, External right and left ear normal. Oropharynx is clear and moist EYES:  Conjunctivae and EOM are normal. Pupils are equal, round, and reactive to light. No scleral icterus.  NECK: Normal range of motion, supple, no masses SKIN: Skin is warm and dry. No rash noted. Not diaphoretic. No erythema. No pallor. NEUROLGIC: Alert and oriented to person, place, and time. Normal reflexes, muscle tone coordination. No cranial nerve deficit noted. PSYCHIATRIC: Normal mood and affect. Normal behavior. Normal judgment and thought content. CARDIOVASCULAR: Normal heart rate noted, regular rhythm RESPIRATORY: Effort and breath sounds normal, no problems with respiration noted ABDOMEN: Soft, nontender, nondistended, gravid. MUSCULOSKELETAL: + Right CVA tenderness GU deferred  Labs:  Results for orders placed or performed in visit on 08/16/21 (from the past 24 hour(s))  POCT Urinalysis Dipstick   Collection Time: 08/16/21  2:11 PM  Result Value Ref Range   Color, UA amber    Clarity, UA cloudy    Glucose, UA Negative Negative   Bilirubin, UA neg    Ketones, UA 160    Spec Grav, UA <=1.005 (A) 1.010 - 1.025   Blood, UA large    pH, UA 5.0 5.0 - 8.0   Protein, UA Positive (A) Negative   Urobilinogen, UA 0.2 0.2 or 1.0 E.U./dL   Nitrite, UA positive    Leukocytes, UA Large (3+) (A) Negative   Appearance dark    Odor      Imaging Studies: No results found.   Assessment and Plan: IUP [redacted]w[redacted]d  Pyelonephritis     Admit to Antenatal for antibiotics. Anatomy U/S. POC reviewed with pt.   Naysha Sholl L. Alysia Penna, MD, FACOG Attending Obstetrician & Gynecologist Faculty Practice, [facility]

## 2021-08-16 NOTE — Progress Notes (Signed)
   LOW-RISK PREGNANCY VISIT Patient name: Sarah Campbell MRN [id number]  Date of birth: [birth year] Chief Complaint:   Routine Prenatal Visit  History of Present Illness:   Sarah Campbell is a 21 y.o. G52P0000 female at [redacted]w[redacted]d with an Estimated Date of Delivery: 12/26/21 being seen today for ongoing management of a low-risk pregnancy.  Today she reports fever ~ 102 last night, right sided back pain. Contractions: Not present. Vag. Bleeding: None.  Movement: Present. denies leaking of fluid. Accidentally did not get scheduled for an anatomy scan today.  Review of Systems:   Pertinent items are noted in HPI Denies abnormal vaginal discharge w/ itching/odor/irritation, headaches, visual changes, shortness of breath, chest pain, abdominal pain, severe nausea/vomiting, or problems with bowel movements unless otherwise stated above. Pertinent History Reviewed:  Reviewed past medical,surgical, social, obstetrical and family history.  Reviewed problem list, medications and allergies. Physical Assessment:   Vitals:   08/16/21 1334  BP: 108/81  Pulse: (!) 133  Temp: 99.4 F (37.4 C)  Weight: 149 lb (67.6 kg)  Body mass index is 23.69 kg/m.        Physical Examination:   General appearance: Well appearing, and in no distress  Mental status: Alert, oriented to person, place, and time  Skin: Warm & dry  Cardiovascular: Normal heart rate noted  Respiratory: Normal respiratory effort, no distress  Abdomen: Soft, gravid, nontender  Back:  Right CVAT  Extremities: Edema: None  Fetal Status:     Movement: Present    Chaperone: n/a    Results for orders placed or performed in visit on 08/16/21 (from the past 24 hour(s))  POCT Urinalysis Dipstick   Collection Time: 08/16/21  2:11 PM  Result Value Ref Range   Color, UA amber    Clarity, UA cloudy    Glucose, UA Negative Negative   Bilirubin, UA neg    Ketones, UA 160    Spec Grav, UA <=1.005 (A) 1.010 - 1.025   Blood, UA large    pH, UA  5.0 5.0 - 8.0   Protein, UA Positive (A) Negative   Urobilinogen, UA 0.2 0.2 or 1.0 E.U./dL   Nitrite, UA positive    Leukocytes, UA Large (3+) (A) Negative   Appearance dark    Odor      Assessment & Plan:  1) Low-risk pregnancy G1P0000 at [redacted]w[redacted]d with an Estimated Date of Delivery: 12/26/21   2) Pyelonephritis, admit to [facility] (Dr. Alysia Penna notified).  Will plan to get anatomy scan while inpt   Meds: No orders of the defined types were placed in this encounter.  Labs/procedures today: ua  Plan:  Continue routine obstetrical care  Next visit: prefers in person    Reviewed: Preterm labor symptoms and general obstetric precautions including but not limited to vaginal bleeding, contractions, leaking of fluid and fetal movement were reviewed in detail with the patient.  All questions were answered. Has home bp cuff.. Check bp weekly, let us know if >140/90.   Follow-up: Return for 4 weeks LROB.  Orders Placed This Encounter  Procedures   Culture, OB Urine   US OB Comp + 14 Wk   POCT Urinalysis Dipstick   Jacklyn Shell DNP, CNM 08/16/2021 2:57 PM

## 2021-08-16 NOTE — Progress Notes (Signed)
ROB 21.[redacted] wks GA No [place] today/ missed in f/u note, needs to be scheduled Reports "back" pain, right flank, pressure when she urinated, feels like she "might have a UTI"

## 2021-08-17 ENCOUNTER — Encounter (HOSPITAL_COMMUNITY): Payer: Self-pay | Admitting: Obstetrics and Gynecology

## 2021-08-17 ENCOUNTER — Observation Stay (HOSPITAL_BASED_OUTPATIENT_CLINIC_OR_DEPARTMENT_OTHER): Payer: Commercial Managed Care - HMO

## 2021-08-17 DIAGNOSIS — O26839 Pregnancy related renal disease, unspecified trimester: Secondary | ICD-10-CM | POA: Diagnosis not present

## 2021-08-17 DIAGNOSIS — N12 Tubulo-interstitial nephritis, not specified as acute or chronic: Secondary | ICD-10-CM

## 2021-08-17 DIAGNOSIS — O2302 Infections of kidney in pregnancy, second trimester: Secondary | ICD-10-CM | POA: Diagnosis not present

## 2021-08-17 DIAGNOSIS — Z3A21 21 weeks gestation of pregnancy: Secondary | ICD-10-CM | POA: Diagnosis not present

## 2021-08-17 DIAGNOSIS — O26899 Other specified pregnancy related conditions, unspecified trimester: Secondary | ICD-10-CM | POA: Diagnosis not present

## 2021-08-17 DIAGNOSIS — N1330 Unspecified hydronephrosis: Secondary | ICD-10-CM | POA: Diagnosis not present

## 2021-08-17 DIAGNOSIS — O321XX0 Maternal care for breech presentation, not applicable or unspecified: Secondary | ICD-10-CM

## 2021-08-17 DIAGNOSIS — O99332 Smoking (tobacco) complicating pregnancy, second trimester: Secondary | ICD-10-CM | POA: Diagnosis present

## 2021-08-17 DIAGNOSIS — O09891 Supervision of other high risk pregnancies, first trimester: Secondary | ICD-10-CM

## 2021-08-17 DIAGNOSIS — F17290 Nicotine dependence, other tobacco product, uncomplicated: Secondary | ICD-10-CM | POA: Diagnosis present

## 2021-08-17 DIAGNOSIS — M545 Low back pain, unspecified: Secondary | ICD-10-CM | POA: Diagnosis not present

## 2021-08-17 LAB — URINE CULTURE: Culture: NO GROWTH

## 2021-08-17 MED ORDER — ONDANSETRON 4 MG PO TBDP
4.0000 mg | ORAL_TABLET | Freq: Four times a day (QID) | ORAL | Status: DC | PRN
  Administered 2021-08-17 – 2021-08-20 (×4): 4 mg via ORAL
  Filled 2021-08-17 (×4): qty 1

## 2021-08-17 MED ORDER — ACETAMINOPHEN 500 MG PO TABS
1000.0000 mg | ORAL_TABLET | ORAL | Status: AC
Start: 2021-08-17 — End: 2021-08-17
  Administered 2021-08-17: 1000 mg via ORAL
  Filled 2021-08-17: qty 2

## 2021-08-17 NOTE — Progress Notes (Signed)
Patient ID: Sarah Campbell, female   DOB: [birth year], 21 y.o.   MRN: [id number] FACULTY PRACTICE ANTEPARTUM(COMPREHENSIVE) NOTE  Sarah Campbell is a 21 y.o. G1P0000 at [redacted]w[redacted]d  who is admitted for pyelonephritis.    Fetal presentation is unsure. Length of Stay:  1  Days  Date of admission:08/16/2021  Subjective: Patient reports persistent right flank pain unchanged since admission. She reports some improvement in her nausea. She denies cramping, leakage of fluid or vaginal bleeding. She reports good fetal movement. She is looking forward to her anatomy ultrasound   Vitals:  Blood pressure 100/60, pulse 96, temperature (!) 100.8 F (38.2 C), temperature source Oral, resp. rate 16, height 5' 6.5" (1.689 m), weight 67.5 kg, last menstrual period 03/21/2021, SpO2 98 %. Vitals:   08/16/21 1801 08/16/21 1955 08/16/21 2319 08/17/21 0608  BP:  92/61 (!) 109/58 100/60  Pulse:  (!) 120 96   Resp:  16 16 16   Temp:  98.4 F (36.9 C) 97.8 F (36.6 C) (!) 100.8 F (38.2 C)  TempSrc:  Oral Oral Oral  SpO2:  100% 100% 98%  Weight: 67.5 kg     Height: 5' 6.5" (1.689 m)      Physical Examination: GENERAL: Well-developed, well-nourished female in no acute distress.  LUNGS: Clear to auscultation bilaterally.  HEART: Regular rate and rhythm. ABDOMEN: Soft, nontender, nondistended. No organomegaly. PELVIC: Not indicated EXTREMITIES: No cyanosis, clubbing, or edema, 2+ distal pulses. BACK: Right CVA tenderness   Fetal Monitoring:  doppler 166-169  Labs:  Results for orders placed or performed during the hospital encounter of 08/16/21 (from the past 24 hour(s))  Comprehensive metabolic panel   Collection Time: 08/16/21  5:04 PM  Result Value Ref Range   Sodium 137 135 - 145 mmol/L   Potassium 3.4 (L) 3.5 - 5.1 mmol/L   Chloride 105 98 - 111 mmol/L   CO2 21 (L) 22 - 32 mmol/L   Glucose, Bld 74 70 - 99 mg/dL   BUN 7 6 - 20 mg/dL   Creatinine, Ser 08/18/21 0.44 - 1.00 mg/dL   Calcium 8.6 (L) 8.9 -  10.3 mg/dL   Total Protein 6.1 (L) 6.5 - 8.1 g/dL   Albumin 2.8 (L) 3.5 - 5.0 g/dL   AST 15 15 - 41 U/L   ALT 10 0 - 44 U/L   Alkaline Phosphatase 96 38 - 126 U/L   Total Bilirubin 0.6 0.3 - 1.2 mg/dL   GFR, Estimated 8.41 >66 mL/min   Anion gap 11 5 - 15  CBC   Collection Time: 08/16/21  5:04 PM  Result Value Ref Range   WBC 20.6 (H) 4.0 - 10.5 K/uL   RBC 3.39 (L) 3.87 - 5.11 MIL/uL   Hemoglobin 10.3 (L) 12.0 - 15.0 g/dL   HCT 08/18/21 (L) 30.1 - 60.1 %   MCV 88.2 80.0 - 100.0 fL   MCH 30.4 26.0 - 34.0 pg   MCHC 34.4 30.0 - 36.0 g/dL   RDW 09.3 23.5 - 57.3 %   Platelets 254 150 - 400 K/uL   nRBC 0.0 0.0 - 0.2 %  Type and screen   Collection Time: 08/16/21  5:04 PM  Result Value Ref Range   ABO/RH(D) O POS    Antibody Screen NEG    Sample Expiration      08/19/2021,2359 Performed at [facility], [address], [place], [place]   Results for orders placed or performed in visit on 08/16/21 (from the past  24 hour(s))  POCT Urinalysis Dipstick   Collection Time: 08/16/21  2:11 PM  Result Value Ref Range   Color, UA amber    Clarity, UA cloudy    Glucose, UA Negative Negative   Bilirubin, UA neg    Ketones, UA 160    Spec Grav, UA <=1.005 (A) 1.010 - 1.025   Blood, UA large    pH, UA 5.0 5.0 - 8.0   Protein, UA Positive (A) Negative   Urobilinogen, UA 0.2 0.2 or 1.0 E.U./dL   Nitrite, UA positive    Leukocytes, UA Large (3+) (A) Negative   Appearance dark    Odor      Imaging Studies:    No results found.   Medications:  Scheduled  docusate sodium  100 mg Oral Daily   ketorolac  30 mg Intravenous Q6H   prenatal multivitamin  1 tablet Oral Q1200   I have reviewed the patient's current medications.  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

## 2021-08-18 ENCOUNTER — Inpatient Hospital Stay (HOSPITAL_COMMUNITY): Payer: Commercial Managed Care - HMO

## 2021-08-18 LAB — CBC WITH DIFFERENTIAL/PLATELET
Abs Immature Granulocytes: 0.17 10*3/uL — ABNORMAL HIGH (ref 0.00–0.07)
Basophils Absolute: 0 10*3/uL (ref 0.0–0.1)
Basophils Relative: 1 %
Eosinophils Absolute: 0 10*3/uL (ref 0.0–0.5)
Eosinophils Relative: 0 %
HCT: 29.7 % — ABNORMAL LOW (ref 36.0–46.0)
Hemoglobin: 9.8 g/dL — ABNORMAL LOW (ref 12.0–15.0)
Immature Granulocytes: 2 %
Lymphocytes Relative: 10 %
Lymphs Abs: 0.9 10*3/uL (ref 0.7–4.0)
MCH: 30.2 pg (ref 26.0–34.0)
MCHC: 33 g/dL (ref 30.0–36.0)
MCV: 91.7 fL (ref 80.0–100.0)
Monocytes Absolute: 0.8 10*3/uL (ref 0.1–1.0)
Monocytes Relative: 9 %
Neutro Abs: 6.9 10*3/uL (ref 1.7–7.7)
Neutrophils Relative %: 78 %
Platelets: 198 10*3/uL (ref 150–400)
RBC: 3.24 MIL/uL — ABNORMAL LOW (ref 3.87–5.11)
RDW: 13.7 % (ref 11.5–15.5)
WBC: 8.8 10*3/uL (ref 4.0–10.5)
nRBC: 0 % (ref 0.0–0.2)

## 2021-08-18 MED ORDER — ACETAMINOPHEN 500 MG PO TABS: 1000.0000 mg | ORAL_TABLET | ORAL | Status: DC | PRN

## 2021-08-18 MED ORDER — ACETAMINOPHEN 325 MG PO TABS
325.0000 mg | ORAL_TABLET | Freq: Once | ORAL | Status: AC
Start: 2021-08-18 — End: 2021-08-18
  Administered 2021-08-18: 325 mg via ORAL
  Filled 2021-08-18: qty 1

## 2021-08-18 NOTE — Progress Notes (Signed)
Patient ID: Sarah Campbell, female   DOB: [birth year], 21 y.o.   MRN: [id number] ACULTY PRACTICE ANTEPARTUM COMPREHENSIVE PROGRESS NOTE  Sarah Campbell is a 22 y.o. G1P0000 at [redacted]w[redacted]d  who is admitted for pyelonephritis.   Fetal presentation is unsure. Length of Stay:  2  Days  Subjective: Pt reports still having right CVA tenderness and now with some RLQ tenderness. Occ N/V with meds. Still having chills when spikes fevers Patient reports good fetal movement.  She reports no uterine contractions, no bleeding and no loss of fluid per vagina.  Vitals:  Blood pressure 122/77, pulse 95, temperature 98.2 F (36.8 C), temperature source Oral, resp. rate 15, height 5' 6.5" (1.689 m), weight 67.5 kg, last menstrual period 03/21/2021, SpO2 100 %.  Physical Examination: Lungs clear Heart RRR Abd soft,  + BS, gravid, generalized tenderness, no rebound or guarding Rt CVA tenderness  Fetal Monitoring:   + FHT's  Labs:  Results for orders placed or performed during the hospital encounter of 08/16/21 (from the past 24 hour(s))  CBC with Differential/Platelet   Collection Time: 08/18/21  5:12 AM  Result Value Ref Range   WBC 8.8 4.0 - 10.5 K/uL   RBC 3.24 (L) 3.87 - 5.11 MIL/uL   Hemoglobin 9.8 (L) 12.0 - 15.0 g/dL   HCT 36.1 (L) 44.3 - 15.4 %   MCV 91.7 80.0 - 100.0 fL   MCH 30.2 26.0 - 34.0 pg   MCHC 33.0 30.0 - 36.0 g/dL   RDW 00.8 67.6 - 19.5 %   Platelets 198 150 - 400 K/uL   nRBC 0.0 0.0 - 0.2 %   Neutrophils Relative % 78 %   Neutro Abs 6.9 1.7 - 7.7 K/uL   Lymphocytes Relative 10 %   Lymphs Abs 0.9 0.7 - 4.0 K/uL   Monocytes Relative 9 %   Monocytes Absolute 0.8 0.1 - 1.0 K/uL   Eosinophils Relative 0 %   Eosinophils Absolute 0.0 0.0 - 0.5 K/uL   Basophils Relative 1 %   Basophils Absolute 0.0 0.0 - 0.1 K/uL   Immature Granulocytes 2 %   Abs Immature Granulocytes 0.17 (H) 0.00 - 0.07 K/uL    Imaging Studies:    NA   Medications:  Scheduled  docusate sodium  100 mg Oral  Daily   prenatal multivitamin  1 tablet Oral Q1200   I have reviewed the patient's current medications.  ASSESSMENT: IUP 21 3/7 weeks Pyelonephritis   PLAN: Continues to spike fevers. UC negative. BC pending. WBC normal. Still with CVA tenderness and now with some generalized abd tenderness. Will check MRI abd/pelvis to rule out any intra abd process. Conitnue with current antibiotic regiment. Adjust per test results as indicated.  Continue routine antenatal care.   Hermina Staggers 08/18/2021,9:28 AM

## 2021-08-19 NOTE — Progress Notes (Signed)
Patient ID: Sarah Campbell, female   DOB: Jan 20, 2001, 21 y.o.   MRN: [id number] ACULTY PRACTICE ANTEPARTUM COMPREHENSIVE PROGRESS NOTE  MARCINA KINNISON is a 21 y.o. G1P0000 at [redacted]w[redacted]d  who is admitted for pyelonephritis.   Fetal presentation is unsure. Length of Stay:  3  Days  Subjective: Pt reports feeling better. No fever x 24 hours. Back pain less today as well. Tolerating diet Patient reports good fetal movement.  She reports no uterine contractions, no bleeding and no loss of fluid per vagina.  Vitals:  Blood pressure 103/66, pulse 81, temperature 97.7 F (36.5 C), temperature source Oral, resp. rate 17, height 5' 6.5" (1.689 m), weight 67.5 kg, last menstrual period 03/21/2021, SpO2 97 %.  Physical Examination: Lungs clear Heart RRR Abd soft + BS gravid Ext non tender  Fetal Monitoring:   + FHT's   Labs:  No results found for this or any previous visit (from the past 24 hour(s)).  Imaging Studies:    MRI abd/plevis normal for pregnancy no evidence of appendicitis   Medications:  Scheduled  docusate sodium  100 mg Oral Daily   prenatal multivitamin  1 tablet Oral Q1200   I have reviewed the patient's current medications.  ASSESSMENT: IUP 21 4/7 weeks Pyelonephritis  PLAN: Afebrile x 24 hrs now. Exam improved as well. MRI normal. BC negative x 24 hrs. Continue with IV antibiotics. Possible d/c home tomorrow Continue routine antenatal care.   Hermina Staggers 08/19/2021,8:54 AM

## 2021-08-19 NOTE — Plan of Care (Signed)
  Problem: Clinical Measurements: Goal: Ability to maintain clinical measurements within normal limits will improve Outcome: Progressing Goal: Will remain free from infection Outcome: Progressing Goal: Diagnostic test results will improve Outcome: Progressing Goal: Respiratory complications will improve Outcome: Progressing Goal: Cardiovascular complication will be avoided Outcome: Progressing   Problem: Activity: Goal: Risk for activity intolerance will decrease Outcome: Progressing   Problem: Nutrition: Goal: Adequate nutrition will be maintained Outcome: Progressing   Problem: Skin Integrity: Goal: Risk for impaired skin integrity will decrease Outcome: Progressing   Problem: Education: Goal: Knowledge of disease or condition will improve Outcome: Progressing Goal: Knowledge of the prescribed therapeutic regimen will improve Outcome: Progressing Goal: Individualized Educational Video(s) Outcome: Progressing   Problem: Clinical Measurements: Goal: Complications related to the disease process, condition or treatment will be avoided or minimized Outcome: Progressing   Problem: Urinary Elimination: Goal: Signs and symptoms of infection will decrease Outcome: Progressing

## 2021-08-20 DIAGNOSIS — O2302 Infections of kidney in pregnancy, second trimester: Principal | ICD-10-CM

## 2021-08-20 DIAGNOSIS — Z3A21 21 weeks gestation of pregnancy: Secondary | ICD-10-CM

## 2021-08-20 LAB — FOLATE: Folate: 10.3 ng/mL (ref >5.9)

## 2021-08-20 LAB — IRON AND TIBC
Iron: 43 ug/dL (ref 28–170)
Saturation Ratios: 12 % (ref 10.4–31.8)
TIBC: 368 ug/dL (ref 250–450)
UIBC: 325 ug/dL

## 2021-08-20 LAB — RETICULOCYTES
Immature Retic Fract: 25.1 % — ABNORMAL HIGH (ref 2.3–15.9)
RBC.: 3.39 MIL/uL — ABNORMAL LOW (ref 3.87–5.11)
Retic Count, Absolute: 44.4 10*3/uL (ref 19.0–186.0)
Retic Ct Pct: 1.3 % (ref 0.4–3.1)

## 2021-08-20 LAB — VITAMIN B12: Vitamin B-12: 352 pg/mL (ref 180–914)

## 2021-08-20 LAB — FERRITIN: Ferritin: 50 ng/mL (ref 11–307)

## 2021-08-20 MED ORDER — OXYCODONE-ACETAMINOPHEN 5-325 MG PO TABS
1.0000 | ORAL_TABLET | Freq: Three times a day (TID) | ORAL | 0 refills | Status: DC | PRN
Start: 2021-08-20 — End: 2021-09-20

## 2021-08-20 MED ORDER — SULFAMETHOXAZOLE-TRIMETHOPRIM 800-160 MG PO TABS: 1.0000 | ORAL_TABLET | Freq: Two times a day (BID) | ORAL | 0 refills | Status: AC

## 2021-08-20 MED ORDER — ACETAMINOPHEN 500 MG PO TABS: 1000.0000 mg | ORAL_TABLET | Freq: Three times a day (TID) | ORAL | 0 refills | Status: DC | PRN

## 2021-08-20 MED ORDER — ACETAMINOPHEN 500 MG PO TABS: 1000.0000 mg | ORAL_TABLET | Freq: Two times a day (BID) | ORAL | 0 refills | Status: DC | PRN

## 2021-08-20 MED ORDER — SULFAMETHOXAZOLE-TRIMETHOPRIM 800-160 MG PO TABS
1.0000 | ORAL_TABLET | Freq: Two times a day (BID) | ORAL | Status: DC
  Administered 2021-08-20: 1 via ORAL
  Filled 2021-08-20: qty 1

## 2021-08-20 MED ORDER — DOCUSATE SODIUM 100 MG PO CAPS: 100.0000 mg | ORAL_CAPSULE | Freq: Two times a day (BID) | ORAL | 1 refills | Status: DC | PRN

## 2021-08-20 MED ORDER — ONDANSETRON 4 MG PO TBDP: 4.0000 mg | ORAL_TABLET | Freq: Three times a day (TID) | ORAL | 0 refills | Status: DC | PRN

## 2021-08-20 NOTE — Discharge Summary (Shared)
Physician Discharge Summary  Patient ID: HETHER ANSELMO MRN: [id number] DOB/AGE: 02/04/2000 21 y.o.  Admit date: 08/16/2021 Discharge date: 08/20/2021  Admission Diagnoses:  Discharge Diagnoses:  Principal Problem:   Pyelonephritis affecting pregnancy   Discharged Condition: {condition:18240}  Hospital Course: *** Mild on the right *** Consults: {consultation:18241}  Significant Diagnostic Studies: {diagnostics:18242}  Treatments: {Tx:18249}  Discharge Exam: Blood pressure (!) 95/52, pulse 71, temperature 98 F (36.7 C), temperature source Oral, resp. rate 16, height 5' 6.5" (1.689 m), weight 67.5 kg, last menstrual period 03/21/2021, SpO2 99 %. {physical TKWI:0973532}  Disposition: Discharge disposition: 01-Home or Self Care       Discharge Instructions     Discharge patient   Complete by: As directed    After AM abx are given   Discharge disposition: 01-Home or Self Care   Discharge patient date: 08/20/2021      Allergies as of 08/20/2021   No Known Allergies      Medication List     TAKE these medications    acetaminophen 500 MG tablet Commonly known as: TYLENOL Take 2 tablets (1,000 mg total) by mouth every 12 (twelve) hours as needed for fever or mild pain (for pain scale < 4  OR  temperature  >/=  100.5 F).   docusate sodium 100 MG capsule Commonly known as: COLACE Take 1 capsule (100 mg total) by mouth 2 (two) times daily as needed for mild constipation.   ondansetron 4 MG disintegrating tablet Commonly known as: ZOFRAN-ODT Take 1 tablet (4 mg total) by mouth every 8 (eight) hours as needed for nausea or vomiting.   oxyCODONE-acetaminophen 5-325 MG tablet Commonly known as: PERCOCET/ROXICET Take 1 tablet by mouth every 8 (eight) hours as needed for severe pain.   prenatal vitamin w/FE, FA 27-1 MG Tabs tablet Take 1 tablet by mouth daily at 12 noon.   sulfamethoxazole-trimethoprim 800-160 MG tablet Commonly known as: BACTRIM DS Take 1  tablet by mouth every 12 (twelve) hours for 10 days.        Follow-up Information     [facility]. Go in 1 week(s).   Specialty: Obstetrics and Gynecology Why: call the office on thursday if you have not heard about this appointment yet Contact information: [address] [place] [address] [phone]                Signed: Hopewell Bing 08/20/2021, 9:43 AM

## 2021-08-23 ENCOUNTER — Ambulatory Visit (INDEPENDENT_AMBULATORY_CARE_PROVIDER_SITE_OTHER): Payer: Medicaid Other | Admitting: Obstetrics & Gynecology

## 2021-08-23 ENCOUNTER — Encounter: Payer: Self-pay | Admitting: Obstetrics & Gynecology

## 2021-08-23 VITALS — BP 114/70 | HR 78 | Wt 149.2 lb

## 2021-08-23 DIAGNOSIS — Z3402 Encounter for supervision of normal first pregnancy, second trimester: Secondary | ICD-10-CM

## 2021-08-23 DIAGNOSIS — O99012 Anemia complicating pregnancy, second trimester: Secondary | ICD-10-CM

## 2021-08-23 DIAGNOSIS — Z87448 Personal history of other diseases of urinary system: Secondary | ICD-10-CM

## 2021-08-23 LAB — POCT URINALYSIS DIPSTICK OB
Blood, UA: NEGATIVE
Glucose, UA: NEGATIVE
Ketones, UA: NEGATIVE
Leukocytes, UA: NEGATIVE
Nitrite, UA: NEGATIVE
POC,PROTEIN,UA: NEGATIVE

## 2021-08-23 LAB — CULTURE, BLOOD (ROUTINE X 2)
Culture: NO GROWTH
Culture: NO GROWTH
Special Requests: ADEQUATE
Special Requests: ADEQUATE

## 2021-08-23 NOTE — Progress Notes (Signed)
LOW-RISK PREGNANCY VISIT Patient name: Sarah Campbell MRN [id number]  Date of birth: 2000/11/21 Chief Complaint:   Routine Prenatal Visit  History of Present Illness:   Sarah Campbell is a 21 y.o. G37P0000 female at [redacted]w[redacted]d with an Estimated Date of Delivery: 12/26/21 being seen today for ongoing management of a low-risk pregnancy.   Recent hospitalization for Rt pyelonephritis- feeling much better.  No fever/chills.  Urinary symptoms resolved.  Of note, final Urine culture negative.      06/18/2021    2:30 PM 05/30/2020   10:43 AM 12/31/2017    1:40 PM 10/15/2017   12:01 PM  Depression screen PHQ 2/9  Decreased Interest 0 0 0 0  Down, Depressed, Hopeless 0 0 0 0  PHQ - 2 Score 0 0 0 0  Altered sleeping 2 1    Tired, decreased energy 0 1    Change in appetite 0 0    Feeling bad or failure about yourself  0 0    Trouble concentrating 0 0    Moving slowly or fidgety/restless 0 0    Suicidal thoughts 0 0    PHQ-9 Score 2 2      Today she reports no complaints. Contractions: Not present. Vag. Bleeding: None.  Movement: Present. denies leaking of fluid. Review of Systems:   Pertinent items are noted in HPI Denies abnormal vaginal discharge w/ itching/odor/irritation, headaches, visual changes, shortness of breath, chest pain, abdominal pain, severe nausea/vomiting, or problems with urination or bowel movements unless otherwise stated above. Pertinent History Reviewed:  Reviewed past medical,surgical, social, obstetrical and family history.  Reviewed problem list, medications and allergies.  Physical Assessment:   Vitals:   08/23/21 0929  BP: 114/70  Pulse: 78  Weight: 149 lb 3.2 oz (67.7 kg)  Body mass index is 23.72 kg/m.        Physical Examination:   General appearance: Well appearing, and in no distress  Mental status: Alert, oriented to person, place, and time  Skin: Warm & dry  Respiratory: Normal respiratory effort, no distress  Abdomen: Soft, gravid,  nontender  Pelvic: Cervical exam deferred         Extremities: Edema: None  Psych:  mood and affect appropriate  Fetal Status: Fetal Heart Rate (bpm): 160 Fundal Height: 20 cm Movement: Present    Chaperone: n/a    No results found for this or any previous visit (from the past 24 hour(s)).   Assessment & Plan:  1) Low-risk pregnancy G1P0000 at [redacted]w[redacted]d with an Estimated Date of Delivery: 12/26/21   2) s/p pyelo- final UCx neg -since culture negative will hold off on suppression therapy -plan for Urine dip each visit and culture q trimester  3) Anemia -to date work up negative, plan to recheck next visit   Meds: No orders of the defined types were placed in this encounter.  Labs/procedures today: none, next visit PN-2  Plan:  Continue routine obstetrical care  Next visit: prefers in person    Reviewed: Preterm labor symptoms and general obstetric precautions including but not limited to vaginal bleeding, contractions, leaking of fluid and fetal movement were reviewed in detail with the patient.  All questions were answered. Pt has home bp cuff. Check bp weekly, let us know if >140/90.   Follow-up: Return in about 4 weeks (around 09/20/2021) for LROB visit and PN-2.  No orders of the defined types were placed in this encounter.   Myna Hidalgo, DO Attending Obstetrician & Gynecologist, Faculty  [facility] [employer], [facility]

## 2021-09-13 ENCOUNTER — Encounter: Payer: Medicaid Other | Admitting: Advanced Practice Midwife

## 2021-09-13 ENCOUNTER — Telehealth: Payer: Self-pay

## 2021-09-13 NOTE — Telephone Encounter (Signed)
Patient would like to speak with a nurse, c/o dizziness.

## 2021-09-13 NOTE — Telephone Encounter (Signed)
Patient states she is having dizziness along with episodes of heart racing.  States she is only taking PNV.  Informed patient Hgb was 9.8 three weeks ago. Discussed with Drenda Freeze, CNM and patient advised to start taking over the counter iron supplements every other day along with her PNV and to allso push fluids and If symptoms worsen, to let us know or go to [facility].  Pt verbalized understanding.

## 2021-09-14 ENCOUNTER — Encounter: Payer: Self-pay | Admitting: Obstetrics & Gynecology

## 2021-09-20 ENCOUNTER — Other Ambulatory Visit: Payer: Medicaid Other

## 2021-09-20 ENCOUNTER — Ambulatory Visit (INDEPENDENT_AMBULATORY_CARE_PROVIDER_SITE_OTHER): Payer: Medicaid Other | Admitting: Advanced Practice Midwife

## 2021-09-20 ENCOUNTER — Encounter: Payer: Self-pay | Admitting: Advanced Practice Midwife

## 2021-09-20 VITALS — BP 124/72 | HR 103 | Wt 152.0 lb

## 2021-09-20 DIAGNOSIS — Z113 Encounter for screening for infections with a predominantly sexual mode of transmission: Secondary | ICD-10-CM

## 2021-09-20 DIAGNOSIS — R000 Tachycardia, unspecified: Secondary | ICD-10-CM

## 2021-09-20 DIAGNOSIS — Z3A26 26 weeks gestation of pregnancy: Secondary | ICD-10-CM

## 2021-09-20 DIAGNOSIS — Z87440 Personal history of urinary (tract) infections: Secondary | ICD-10-CM

## 2021-09-20 DIAGNOSIS — Z8759 Personal history of other complications of pregnancy, childbirth and the puerperium: Secondary | ICD-10-CM

## 2021-09-20 DIAGNOSIS — Z3402 Encounter for supervision of normal first pregnancy, second trimester: Secondary | ICD-10-CM

## 2021-09-20 DIAGNOSIS — Z131 Encounter for screening for diabetes mellitus: Secondary | ICD-10-CM

## 2021-09-20 DIAGNOSIS — O2302 Infections of kidney in pregnancy, second trimester: Secondary | ICD-10-CM

## 2021-09-20 NOTE — Addendum Note (Signed)
Addended by: Jacklyn Shell on: 09/20/2021 12:41 PM   Modules accepted: Orders

## 2021-09-20 NOTE — Progress Notes (Addendum)
   LOW-RISK PREGNANCY VISIT Patient name: Sarah Campbell MRN [id number]  Date of birth: Jun 14, 2000 Chief Complaint:   Routine Prenatal Visit (PN2)  History of Present Illness:   Sarah Campbell is a 21 y.o. G62P0000 female at [redacted]w[redacted]d with an Estimated Date of Delivery: 12/26/21 being seen today for ongoing management of a low-risk pregnancy.  Today she reports concerned about STD (had sex w/ex). Also has tachycardia several times a week, feels dizzyt.  HR often 140-160 during episodes. Contractions: Not present.  .  Movement: Present. denies leaking of fluid. Review of Systems:   Pertinent items are noted in HPI Denies abnormal vaginal discharge w/ itching/odor/irritation, headaches, visual changes, shortness of breath, chest pain, abdominal pain, severe nausea/vomiting, or problems with urination or bowel movements unless otherwise stated above. Pertinent History Reviewed:  Reviewed past medical,surgical, social, obstetrical and family history.  Reviewed problem list, medications and allergies. Physical Assessment:   Vitals:   09/20/21 0937  BP: 124/72  Pulse: (!) 103  Weight: 152 lb (68.9 kg)  Body mass index is 24.17 kg/m.        Physical Examination:   General appearance: Well appearing, and in no distress  Mental status: Alert, oriented to person, place, and time  Skin: Warm & dry  Cardiovascular: Normal heart rate noted  Respiratory: Normal respiratory effort, no distress  Abdomen: Soft, gravid, nontender  Pelvic: Cervical exam deferred         Extremities: Edema: None  Fetal Status: Fetal Heart Rate (bpm): 148 Fundal Height: 24 cm Movement: Present    Chaperone: n/a    No results found for this or any previous visit (from the past 24 hour(s)).  Assessment & Plan:  1) Low-risk pregnancy G1P0000 at [redacted]w[redacted]d with an Estimated Date of Delivery: 12/26/21   2) Hx pyelo, check urine cx today (monthly)   3)  Intermittent tachycardia;  cards referral  Meds: No orders of the  defined types were placed in this encounter.  Labs/procedures today: PN2, urine cx GC/CHL/trich test  Plan:  Continue routine obstetrical care  Next visit: prefers in person    Reviewed: Preterm labor symptoms and general obstetric precautions including but not limited to vaginal bleeding, contractions, leaking of fluid and fetal movement were reviewed in detail with the patient.  All questions were answered. Has home bp cuff. Check bp weekly, let us know if >140/90.   Follow-up: Return in about 4 weeks (around 10/18/2021) for LROB.  Orders Placed This Encounter  Procedures   Urine Culture   GC/Chlamydia Probe Amp   Trichomonas vaginalis, RNA   POC Urinalysis Dipstick OB   Jacklyn Shell DNP, CNM 09/20/2021 10:08 AM

## 2021-09-20 NOTE — Patient Instructions (Addendum)
[place], I greatly value your feedback.  If you receive a survey following your visit with [place] today, we appreciate you taking the time to fill it out.  Thanks, Nigel Berthold, CNM   [place]!!! It is now [place] [facility] ([place], [place] [address]) Entrance located off of [address] parking   Go to [email or web address] to register for FREE online childbirth classes    Call the office [phone]) or go to [facility] if: You begin to have strong, frequent contractions Your water breaks.  Sometimes it is a big gush of fluid, sometimes it is just a trickle that keeps getting your panties wet or running down your legs You have vaginal bleeding.  It is normal to have a small amount of spotting if your cervix was checked.  You don't feel your baby moving like normal.  If you don't, get you something to eat and drink and lay down and focus on feeling your baby move.  You should feel at least 10 movements in 2 hours.  If you don't, you should call the office or go to [facility].    Tdap Vaccine It is recommended that you get the Tdap vaccine during the third trimester of EACH pregnancy to help protect your baby from getting pertussis (whooping cough) 27-36 weeks is the BEST time to do this so that you can pass the protection on to your baby. During pregnancy is better than after pregnancy, but if you are unable to get it during pregnancy it will be offered at the hospital.  You will be offered this vaccine in the office after 27 weeks. If you do not have health insurance, you can get this vaccine at the health department or your family doctor Everyone who will be around your baby should also be up-to-date on their vaccines. Adults (who are not pregnant) only need 1 dose of Tdap during adulthood.   Third Trimester of Pregnancy The third trimester is from week 29 through week 42, months 7 through 9. The third  trimester is a time when the fetus is growing rapidly. At the end of the ninth month, the fetus is about 20 inches in length and weighs 6-10 pounds.  BODY CHANGES Your body goes through many changes during pregnancy. The changes vary from woman to woman.  Your weight will continue to increase. You can expect to gain 25-35 pounds (11-16 kg) by the end of the pregnancy. You may begin to get stretch marks on your hips, abdomen, and breasts. You may urinate more often because the fetus is moving lower into your pelvis and pressing on your bladder. You may develop or continue to have heartburn as a result of your pregnancy. You may develop constipation because certain hormones are causing the muscles that push waste through your intestines to slow down. You may develop hemorrhoids or swollen, bulging veins (varicose veins). You may have pelvic pain because of the weight gain and pregnancy hormones relaxing your joints between the bones in your pelvis. Backaches may result from overexertion of the muscles supporting your posture. You may have changes in your hair. These can include thickening of your hair, rapid growth, and changes in texture. Some women also have hair loss during or after pregnancy, or hair that feels dry or thin. Your hair will most likely return to normal after your baby is born. Your breasts will continue to grow and be tender. A  yellow discharge may leak from your breasts called colostrum. Your belly button may stick out. You may feel short of breath because of your expanding uterus. You may notice the fetus "dropping," or moving lower in your abdomen. You may have a bloody mucus discharge. This usually occurs a few days to a week before labor begins. Your cervix becomes thin and soft (effaced) near your due date. WHAT TO EXPECT AT YOUR PRENATAL EXAMS  You will have prenatal exams every 2 weeks until week 36. Then, you will have weekly prenatal exams. During a routine prenatal  visit: You will be weighed to make sure you and the fetus are growing normally. Your blood pressure is taken. Your abdomen will be measured to track your baby's growth. The fetal heartbeat will be listened to. Any test results from the previous visit will be discussed. You may have a cervical check near your due date to see if you have effaced. At around 36 weeks, your caregiver will check your cervix. At the same time, your caregiver will also perform a test on the secretions of the vaginal tissue. This test is to determine if a type of bacteria, Group B streptococcus, is present. Your caregiver will explain this further. Your caregiver may ask you: What your birth plan is. How you are feeling. If you are feeling the baby move. If you have had any abnormal symptoms, such as leaking fluid, bleeding, severe headaches, or abdominal cramping. If you have any questions. Other tests or screenings that may be performed during your third trimester include: Blood tests that check for low iron levels (anemia). Fetal testing to check the health, activity level, and growth of the fetus. Testing is done if you have certain medical conditions or if there are problems during the pregnancy. FALSE LABOR You may feel small, irregular contractions that eventually go away. These are called Braxton Hicks contractions, or false labor. Contractions may last for hours, days, or even weeks before true labor sets in. If contractions come at regular intervals, intensify, or become painful, it is best to be seen by your caregiver.  SIGNS OF LABOR  Menstrual-like cramps. Contractions that are 5 minutes apart or less. Contractions that start on the top of the uterus and spread down to the lower abdomen and back. A sense of increased pelvic pressure or back pain. A watery or bloody mucus discharge that comes from the vagina. If you have any of these signs before the 37th week of pregnancy, call your caregiver right away.  You need to go to the hospital to get checked immediately. HOME CARE INSTRUCTIONS  Avoid all smoking, herbs, alcohol, and unprescribed drugs. These chemicals affect the formation and growth of the baby. Follow your caregiver's instructions regarding medicine use. There are medicines that are either safe or unsafe to take during pregnancy. Exercise only as directed by your caregiver. Experiencing uterine cramps is a good sign to stop exercising. Continue to eat regular, healthy meals. Wear a good support bra for breast tenderness. Do not use hot tubs, steam rooms, or saunas. Wear your seat belt at all times when driving. Avoid raw meat, uncooked cheese, cat litter boxes, and soil used by cats. These carry germs that can cause birth defects in the baby. Take your prenatal vitamins. Try taking a stool softener (if your caregiver approves) if you develop constipation. Eat more high-fiber foods, such as fresh vegetables or fruit and whole grains. Drink plenty of fluids to keep your urine clear or pale yellow.  Take warm sitz baths to soothe any pain or discomfort caused by hemorrhoids. Use hemorrhoid cream if your caregiver approves. If you develop varicose veins, wear support hose. Elevate your feet for 15 minutes, 3-4 times a day. Limit salt in your diet. Avoid heavy lifting, wear low heal shoes, and practice good posture. Rest a lot with your legs elevated if you have leg cramps or low back pain. Visit your dentist if you have not gone during your pregnancy. Use a soft toothbrush to brush your teeth and be gentle when you floss. A sexual relationship may be continued unless your caregiver directs you otherwise. Do not travel far distances unless it is absolutely necessary and only with the approval of your caregiver. Take prenatal classes to understand, practice, and ask questions about the labor and delivery. Make a trial run to the hospital. Pack your hospital bag. Prepare the baby's  nursery. Continue to go to all your prenatal visits as directed by your caregiver. SEEK MEDICAL CARE IF: You are unsure if you are in labor or if your water has broken. You have dizziness. You have mild pelvic cramps, pelvic pressure, or nagging pain in your abdominal area. You have persistent nausea, vomiting, or diarrhea. You have a bad smelling vaginal discharge. You have pain with urination. SEEK IMMEDIATE MEDICAL CARE IF:  You have a fever. You are leaking fluid from your vagina. You have spotting or bleeding from your vagina. You have severe abdominal cramping or pain. You have rapid weight loss or gain. You have shortness of breath with chest pain. You notice sudden or extreme swelling of your face, hands, ankles, feet, or legs. You have not felt your baby move in over an hour. You have severe headaches that do not go away with medicine. You have vision changes. Document Released: 01/08/2001 Document Revised: 01/19/2013 Document Reviewed: 03/17/2012 [facility] Patient Information 2015 [place], [place]. This information is not intended to replace advice given to you by your health care provider. Make sure you discuss any questions you have with your health care provider.   Vagal Maneuver Techniques  There are different types of vagal maneuvers that can be used to slow a person's heart rate. There is not one maneuver which will work for all patients. In some instances, it may take a little trial and error to determine what technique works best for an individual patient. All of the procedures or techniques below may stimulate the vagal nerve.  Bearing Down: Bearing down, which medically is referred to as the Valsalva maneuver, is one of the most common ways to stimulate the vagus nerve. The patient is instructed to bear down as if they were having a bowel movement. In effect, the patient is expiring against a closed glottis. An alternative way to perform a Valsalva maneuver is to tell  the patient to blow through an occluded straw for several seconds (Hold one end closed and blow through the other). These maneuvers increase intrathoracic pressure and stimulate the vagus nerve.  Coughing: Coughing creates the same physiological response as bearing down, but some people may find it easier to perform. The cough must be forceful and sustained i.e. a single cough will likely not be effective in terminating an arrhythmia  Cold Stimulus to the Face: This technique involves emerging the face in ice cold water. Alternative methods include placing on icepack on the face or a washcloth soaked in ice water. The cold stimuli to the face should last about 10 seconds. This type of vagal  maneuver creates a physiological response similar to that which occurs if a person is submerged in cold water (diver's reflex).  Gagging: Although it may not sound pleasant, gagging also stimulates the vagus nerve and can stop an episode of SVT.  A tongue depressor is briefly inserted into the mouth, touching the back of the throat, which causes the person to reflexively gag. The gag reflex stimulates the vagus nerve.

## 2021-09-21 LAB — ANTIBODY SCREEN: Antibody Screen: NEGATIVE

## 2021-09-21 LAB — CBC
Hematocrit: 33.4 % — ABNORMAL LOW (ref 34.0–46.6)
Hemoglobin: 11 g/dL — ABNORMAL LOW (ref 11.1–15.9)
MCH: 29.5 pg (ref 26.6–33.0)
MCHC: 32.9 g/dL (ref 31.5–35.7)
MCV: 90 fL (ref 79–97)
Platelets: 268 10*3/uL (ref 150–450)
RBC: 3.73 x10E6/uL — ABNORMAL LOW (ref 3.77–5.28)
RDW: 12.8 % (ref 11.7–15.4)
WBC: 10 10*3/uL (ref 3.4–10.8)

## 2021-09-21 LAB — RPR: RPR Ser Ql: NONREACTIVE

## 2021-09-21 LAB — GLUCOSE TOLERANCE, 2 HOURS W/ 1HR
Glucose, 1 hour: 151 mg/dL (ref 70–179)
Glucose, 2 hour: 125 mg/dL (ref 70–152)
Glucose, Fasting: 71 mg/dL (ref 70–91)

## 2021-09-21 LAB — HIV ANTIBODY (ROUTINE TESTING W REFLEX): HIV Screen 4th Generation wRfx: NONREACTIVE

## 2021-09-22 LAB — URINE CULTURE

## 2021-09-23 LAB — TRICHOMONAS VAGINALIS, PROBE AMP: Trich vag by NAA: NEGATIVE

## 2021-09-23 LAB — GC/CHLAMYDIA PROBE AMP
Chlamydia trachomatis, NAA: NEGATIVE
Neisseria Gonorrhoeae by PCR: NEGATIVE

## 2021-10-02 DIAGNOSIS — Z029 Encounter for administrative examinations, unspecified: Secondary | ICD-10-CM

## 2021-10-09 ENCOUNTER — Encounter: Payer: Self-pay | Admitting: Advanced Practice Midwife

## 2021-10-10 NOTE — Progress Notes (Deleted)
Cardio-Obstetrics Clinic  New Evaluation  Date:  10/11/2021   ID:  Sarah Campbell, DOB [birth year], MRN [id number]  PCP:  Pcp, No   [facility] Providers Cardiologist:  None  Electrophysiologist:  None     Referring MD: Wandra Mannan*   Chief Complaint: tachycardia  History of Present Illness:    Sarah Campbell is a 21 y.o. female [G1P0000] who is being seen today for the evaluation of tachycardia at the request of Sarah Campbell, Kenn File*.   Patient seen by Wandra Mannan on 09/20/21. She has been having intermittent episodes of rapid heart rates prompting referral to Cardio-OB for further management.   Today, ***   Prior CV Studies Reviewed: The following studies were reviewed today: ***  Past Medical History:  Diagnosis Date   Heart murmur    Medical history non-contributory     No past surgical history on file. { Click here to update PMH, PSH, OB Hx then refresh note  :1}   OB History     Gravida  1   Para  0   Term  0   Preterm  0   AB  0   Living         SAB  0   IAB  0   Ectopic  0   Multiple      Live Births              { Click here to update OB Charting then refresh note  :1}    Current Medications: No outpatient medications have been marked as taking for the 10/12/21 encounter (Appointment) with Meriam Sprague, MD.     Allergies:   Patient has no known allergies.   Social History   Socioeconomic History   Marital status: Single    Spouse name: Not on file   Number of children: Not on file   Years of education: Not on file   Highest education level: Not on file  Occupational History   Not on file  Tobacco Use   Smoking status: Every Day    Types: E-cigarettes   Smokeless tobacco: Never  Vaping Use   Vaping Use: Every day   Substances: Nicotine  Substance and Sexual Activity   Alcohol use: No   Drug use: No   Sexual activity: Yes    Birth control/protection: None  Other  Topics Concern   Not on file  Social History Narrative   ** Merged History Encounter **       Social Determinants of Health   Financial Resource Strain: Low Risk  (09/20/2021)   Overall Financial Resource Strain (CARDIA)    Difficulty of Paying Living Expenses: Not hard at all  Food Insecurity: No Food Insecurity (09/20/2021)   Hunger Vital Sign    Worried About Running Out of Food in the Last Year: Never true    Ran Out of Food in the Last Year: Never true  Transportation Needs: No Transportation Needs (09/20/2021)   PRAPARE - Administrator, Civil Service (Medical): No    Lack of Transportation (Non-Medical): No  Physical Activity: Sufficiently Active (09/20/2021)   Exercise Vital Sign    Days of Exercise per Week: 7 days    Minutes of Exercise per Session: 30 min  Stress: No Stress Concern Present (09/20/2021)   [employer] of Occupational Health - Occupational Stress Questionnaire    Feeling of Stress : Not at all  Social Connections: Moderately Isolated (09/20/2021)   Social Connection  and Isolation Panel [NHANES]    Frequency of Communication with Friends and Family: More than three times a week    Frequency of Social Gatherings with Friends and Family: Twice a week    Attends Religious Services: 1 to 4 times per year    Active Member of [employer] or Organizations: No    Attends Engineer, structural: Never    Marital Status: Never married  { Click here to update SDOH then refresh :1}    Family History  Problem Relation Age of Onset   Alcoholism Father    Diabetes Maternal Grandfather    Heart attack Maternal Grandfather    Stroke Maternal Grandfather    Heart attack Paternal Grandmother    Heart attack Maternal Aunt    Stroke Maternal Aunt    { Click here to update FH then refresh note    :1}   ROS:   Please see the history of present illness.    *** All other systems reviewed and are negative.   Labs/EKG Reviewed:    EKG:   EKG is ***  ordered today.  The ekg ordered today demonstrates ***  Recent Labs: 08/16/2021: ALT 10; BUN 7; Creatinine, Ser 0.73; Potassium 3.4; Sodium 137 09/20/2021: Hemoglobin 11.0; Platelets 268   Recent Lipid Panel No results found for: "CHOL", "TRIG", "HDL", "CHOLHDL", "LDLCALC", "LDLDIRECT"  Physical Exam:    VS:  LMP 03/21/2021     Wt Readings from Last 3 Encounters:  09/20/21 152 lb (68.9 kg)  08/23/21 149 lb 3.2 oz (67.7 kg)  08/16/21 148 lb 13 oz (67.5 kg)     GEN: *** Well nourished, well developed in no acute distress HEENT: Normal NECK: No JVD; No carotid bruits LYMPHATICS: No lymphadenopathy CARDIAC: ***RRR, no murmurs, rubs, gallops RESPIRATORY:  Clear to auscultation without rales, wheezing or rhonchi  ABDOMEN: Soft, non-tender, non-distended MUSCULOSKELETAL:  No edema; No deformity  SKIN: Warm and dry NEUROLOGIC:  Alert and oriented x 3 PSYCHIATRIC:  Normal affect    Risk Assessment/Risk Calculators:   { Click to calculate CARPREG II - THEN refresh note :1}    { Click to caclulate Mod WHO Class of CV Risk - THEN refresh note :1}     { Click for CHADS2VASc Score - THEN Refresh Note    :[id number]}      ASSESSMENT & PLAN:    #Tachycardia: Common in pregnancy and related to HD changes associated with gestation. Will check zio for further evaluation. -Check zio monitor -Increase hydration -Small frequent meals -Avoid caffeine  There are no Patient Instructions on file for this visit.   Dispo:  No follow-ups on file.   Medication Adjustments/Labs and Tests Ordered: Current medicines are reviewed at length with the patient today.  Concerns regarding medicines are outlined above.  Tests Ordered: No orders of the defined types were placed in this encounter.  Medication Changes: No orders of the defined types were placed in this encounter.

## 2021-10-12 ENCOUNTER — Ambulatory Visit (INDEPENDENT_AMBULATORY_CARE_PROVIDER_SITE_OTHER): Payer: Commercial Managed Care - HMO | Admitting: Cardiology

## 2021-10-12 ENCOUNTER — Encounter: Payer: Self-pay | Admitting: Cardiology

## 2021-10-12 VITALS — BP 122/78 | HR 83 | Ht 66.5 in | Wt 156.6 lb

## 2021-10-12 DIAGNOSIS — R002 Palpitations: Secondary | ICD-10-CM

## 2021-10-12 DIAGNOSIS — R011 Cardiac murmur, unspecified: Secondary | ICD-10-CM

## 2021-10-12 NOTE — Progress Notes (Signed)
Cardio-Obstetrics Clinic  New Evaluation  Date:  10/12/2021   ID:  Sarah Campbell, DOB 2000/02/23, MRN [id number]  PCP:  Pcp, No   [facility] Providers Cardiologist:  None  Electrophysiologist:  None     Referring MD: Wandra Mannan*   Chief Complaint: tachycardia  History of Present Illness:    Sarah Campbell is a 21 y.o. female [G1P0000] who is being seen today for the evaluation of tachycardia at the request of Marzella Schlein, Kenn File*.   Patient seen by Wandra Mannan on 09/20/21. She has been having intermittent episodes of rapid heart rates prompting referral to Cardio-OB for further management.   Today, the patient states she has been having frequent episodes of palpitations with associated lightheadedness. She works as a Psychologist, sport and exercise and recalls that for the last 2 months she has experienced HR up to 165 bpm which has been becoming more frequent. Currently experiencing this every 2-3 days and sometimes feels dizzy but has never passed out during episodes. Episodes may last up to an hour. Denies coffee or energy drink usage. Stays well hydrated.   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    Prior CV Studies Reviewed: The following studies were reviewed today: N/A  Past Medical History:  Diagnosis Date   Heart murmur    Medical history non-contributory     No past surgical history on file.    OB History     Gravida  1   Para  0   Term  0   Preterm  0   AB  0   Living         SAB  0   IAB  0   Ectopic  0   Multiple      Live Births                  Current Medications: Current Meds  Medication Sig   acetaminophen (TYLENOL) 500 MG tablet Take 2 tablets (1,000 mg total) by mouth every 12 (twelve) hours as needed for fever or mild pain (for pain scale < 4  OR  temperature  >/=  100.5 F).   docusate sodium (COLACE) 100 MG capsule Take 1 capsule (100 mg total)  by mouth 2 (two) times daily as needed for mild constipation.   ondansetron (ZOFRAN-ODT) 4 MG disintegrating tablet Take 1 tablet (4 mg total) by mouth every 8 (eight) hours as needed for nausea or vomiting.   prenatal vitamin w/FE, FA (PRENATAL 1 + 1) 27-1 MG TABS tablet Take 1 tablet by mouth daily at 12 noon.     Allergies:   Patient has no known allergies.   Social History   Socioeconomic History   Marital status: Single    Spouse name: Not on file   Number of children: Not on file   Years of education: Not on file   Highest education level: Not on file  Occupational History   Not on file  Tobacco Use   Smoking status: Every Day    Types: E-cigarettes   Smokeless tobacco: Never  Vaping Use   Vaping Use: Every day   Substances: Nicotine  Substance and Sexual Activity   Alcohol use: No   Drug use: No   Sexual activity: Yes    Birth control/protection: None  Other Topics Concern   Not on file  Social History Narrative   ** Merged History Encounter **  Social Determinants of Health   Financial Resource Strain: Low Risk  (09/20/2021)   Overall Financial Resource Strain (CARDIA)    Difficulty of Paying Living Expenses: Not hard at all  Food Insecurity: No Food Insecurity (09/20/2021)   Hunger Vital Sign    Worried About Running Out of Food in the Last Year: Never true    Ran Out of Food in the Last Year: Never true  Transportation Needs: No Transportation Needs (09/20/2021)   PRAPARE - Administrator, Civil Service (Medical): No    Lack of Transportation (Non-Medical): No  Physical Activity: Sufficiently Active (09/20/2021)   Exercise Vital Sign    Days of Exercise per Week: 7 days    Minutes of Exercise per Session: 30 min  Stress: No Stress Concern Present (09/20/2021)   [employer] of Occupational Health - Occupational Stress Questionnaire    Feeling of Stress : Not at all  Social Connections: Moderately Isolated (09/20/2021)   Social  Connection and Isolation Panel [NHANES]    Frequency of Communication with Friends and Family: More than three times a week    Frequency of Social Gatherings with Friends and Family: Twice a week    Attends Religious Services: 1 to 4 times per year    Active Member of [employer] or Organizations: No    Attends Engineer, structural: Never    Marital Status: Never married      Family History  Problem Relation Age of Onset   Alcoholism Father    Diabetes Maternal Grandfather    Heart attack Maternal Grandfather    Stroke Maternal Grandfather    Heart attack Paternal Grandmother    Heart attack Maternal Aunt    Stroke Maternal Aunt       ROS:   Please see the history of present illness.    +palpitations, chest pain, dizziness All other systems reviewed and are negative.   Labs/EKG Reviewed:    EKG:   EKG 08/06/21: NSR with sinus arrhythmia  Recent Labs: 08/16/2021: ALT 10; BUN 7; Creatinine, Ser 0.73; Potassium 3.4; Sodium 137 09/20/2021: Hemoglobin 11.0; Platelets 268   Recent Lipid Panel No results found for: "CHOL", "TRIG", "HDL", "CHOLHDL", "LDLCALC", "LDLDIRECT"  Physical Exam:    VS:  BP 122/78   Pulse 83   Ht 5' 6.5" (1.689 m)   Wt 156 lb 9.6 oz (71 kg)   LMP 03/21/2021   SpO2 99%   BMI 24.90 kg/m     Wt Readings from Last 3 Encounters:  10/12/21 156 lb 9.6 oz (71 kg)  09/20/21 152 lb (68.9 kg)  08/23/21 149 lb 3.2 oz (67.7 kg)     GEN: Well nourished, well developed in no acute distress HEENT: Normal NECK: No JVD; No carotid bruits CARDIAC: RRR, 2/6 systolic flow murmur. No rubs/gallops RESPIRATORY:  Clear to auscultation without rales, wheezing or rhonchi  MUSCULOSKELETAL:  No edema; No deformity  SKIN: Warm and dry NEUROLOGIC:  Alert and oriented x 3 PSYCHIATRIC:  Normal affect    Risk Assessment/Risk Calculators:     ASSESSMENT & PLAN:    #Tachycardia: Patient reports episodes of rapid HR up to 160s while walking at work with  associated lightheadedness. No syncope. Has been occurring more frequently over the past couple of months. While palpitations are common in pregnancy in the setting of significant HD changes associated with gestation, the degree of HR elevation is concerning. Will check zio monitor for further monitor for further evaluation. -Check zio monitor -Increase  hydration including electrolyte replacement -Compression therapy -Avoid caffeine  #Murmur: Common in pregnancy, although with history of "hole in heart", will check TTE for evaluation. -Check TTE  Patient Instructions  Medication Instructions:   Your physician recommends that you continue on your current medications as directed. Please refer to the Current Medication list given to you today.  *If you need a refill on your cardiac medications before your next appointment, please call your pharmacy*   Testing/Procedures:  Your physician has requested that you have an echocardiogram. Echocardiography is a painless test that uses sound waves to create images of your heart. It provides your doctor with information about the size and shape of your heart and how well your heart's chambers and valves are working. This procedure takes approximately one hour. There are no restrictions for this procedure. Cardiac-OB patient: to be performed by [place] or [place].    ZIO XT- Long Term Monitor Instructions  Your physician has requested you wear a ZIO patch monitor for 7 days.  This is a single patch monitor. [employer] supplies one patch monitor per enrollment. Additional stickers are not available. Please do not apply patch if you will be having a Nuclear Stress Test,  Echocardiogram, Cardiac CT, MRI, or Chest Xray during the period you would be wearing the  monitor. The patch cannot be worn during these tests. You cannot remove and re-apply the  ZIO XT patch monitor.  Your ZIO patch monitor will be mailed 3 day USPS to your address on file. It may  take 3-5 days  to receive your monitor after you have been enrolled.  Once you have received your monitor, please review the enclosed instructions. Your monitor  has already been registered assigning a specific monitor serial # to you.  Billing and Patient Assistance Program Information  We have supplied [employer] with any of your insurance information on file for billing purposes. [employer] offers a sliding scale Patient Assistance Program for patients that do not have  insurance, or whose insurance does not completely cover the cost of the ZIO monitor.  You must apply for the Patient Assistance Program to qualify for this discounted rate.  To apply, please call [employer] at [phone], select option 4, select option 2, ask to apply for  Patient Assistance Program. Meredeth Ide will ask your household income, and how many people  are in your household. They will quote your out-of-pocket cost based on that information.  [employer] will also be able to set up a 13-month, interest-free payment plan if needed.  Applying the monitor   Shave hair from upper left chest.  Hold abrader disc by orange tab. Rub abrader in 40 strokes over the upper left chest as  indicated in your monitor instructions.  Clean area with 4 enclosed alcohol pads. Let dry.  Apply patch as indicated in monitor instructions. Patch will be placed under collarbone on left  side of chest with arrow pointing upward.  Rub patch adhesive wings for 2 minutes. Remove white label marked "1". Remove the white  label marked "2". Rub patch adhesive wings for 2 additional minutes.  While looking in a mirror, press and release button in center of patch. A small green light will  flash 3-4 times. This will be your only indicator that the monitor has been turned on.  Do not shower for the first 24 hours. You may shower after the first 24 hours.  Press the button if you feel a symptom. You will hear a small click. Record Date,  Time and  Symptom in  the Patient Logbook.  When you are ready to remove the patch, follow instructions on the last 2 pages of Patient  Logbook. Stick patch monitor onto the last page of Patient Logbook.  Place Patient Logbook in the blue and white box. Use locking tab on box and tape box closed  securely. The blue and white box has prepaid postage on it. Please place it in the mailbox as  soon as possible. Your physician should have your test results approximately 7 days after the  monitor has been mailed back to [facility].  Call [facility] Customer Care at [phone] if you have questions regarding  your ZIO XT patch monitor. Call them immediately if you see an orange light blinking on your  monitor.  If your monitor falls off in less than 4 days, contact our Monitor department at [phone].  If your monitor becomes loose or falls off after 4 days call [employer] at [phone] for  suggestions on securing your monitor    Follow-Up:   AS NEEDED WITH DR. Shari Prows HERE AT [facility]--WE WILL CALL YOU WITH YOUR TEST RESULTS           Dispo:  No follow-ups on file.   Medication Adjustments/Labs and Tests Ordered: Current medicines are reviewed at length with the patient today.  Concerns regarding medicines are outlined above.  Tests Ordered: Orders Placed This Encounter  Procedures   LONG TERM MONITOR (3-14 DAYS)   ECHOCARDIOGRAM COMPLETE   Medication Changes: No orders of the defined types were placed in this encounter.

## 2021-10-12 NOTE — Patient Instructions (Signed)
Medication Instructions:   Your physician recommends that you continue on your current medications as directed. Please refer to the Current Medication list given to you today.  *If you need a refill on your cardiac medications before your next appointment, please call your pharmacy*   Testing/Procedures:  Your physician has requested that you have an echocardiogram. Echocardiography is a painless test that uses sound waves to create images of your heart. It provides your doctor with information about the size and shape of your heart and how well your heart's chambers and valves are working. This procedure takes approximately one hour. There are no restrictions for this procedure. Cardiac-OB patient: to be performed by [place] or [place].    ZIO XT- Long Term Monitor Instructions  Your physician has requested you wear a ZIO patch monitor for 7 days.  This is a single patch monitor. [employer] supplies one patch monitor per enrollment. Additional stickers are not available. Please do not apply patch if you will be having a Nuclear Stress Test,  Echocardiogram, Cardiac CT, MRI, or Chest Xray during the period you would be wearing the  monitor. The patch cannot be worn during these tests. You cannot remove and re-apply the  ZIO XT patch monitor.  Your ZIO patch monitor will be mailed 3 day USPS to your address on file. It may take 3-5 days  to receive your monitor after you have been enrolled.  Once you have received your monitor, please review the enclosed instructions. Your monitor  has already been registered assigning a specific monitor serial # to you.  Billing and Patient Assistance Program Information  We have supplied [employer] with any of your insurance information on file for billing purposes. [employer] offers a sliding scale Patient Assistance Program for patients that do not have  insurance, or whose insurance does not completely cover the cost of the ZIO monitor.  You must apply for  the Patient Assistance Program to qualify for this discounted rate.  To apply, please call [employer] at [phone], select option 4, select option 2, ask to apply for  Patient Assistance Program. Meredeth Ide will ask your household income, and how many people  are in your household. They will quote your out-of-pocket cost based on that information.  [employer] will also be able to set up a 73-month, interest-free payment plan if needed.  Applying the monitor   Shave hair from upper left chest.  Hold abrader disc by orange tab. Rub abrader in 40 strokes over the upper left chest as  indicated in your monitor instructions.  Clean area with 4 enclosed alcohol pads. Let dry.  Apply patch as indicated in monitor instructions. Patch will be placed under collarbone on left  side of chest with arrow pointing upward.  Rub patch adhesive wings for 2 minutes. Remove white label marked "1". Remove the white  label marked "2". Rub patch adhesive wings for 2 additional minutes.  While looking in a mirror, press and release button in center of patch. A small green light will  flash 3-4 times. This will be your only indicator that the monitor has been turned on.  Do not shower for the first 24 hours. You may shower after the first 24 hours.  Press the button if you feel a symptom. You will hear a small click. Record Date, Time and  Symptom in the Patient Logbook.  When you are ready to remove the patch, follow instructions on the last 2 pages of Patient  Logbook. Stick patch monitor  onto the last page of Patient Logbook.  Place Patient Logbook in the blue and white box. Use locking tab on box and tape box closed  securely. The blue and white box has prepaid postage on it. Please place it in the mailbox as  soon as possible. Your physician should have your test results approximately 7 days after the  monitor has been mailed back to [facility].  Call [facility] Customer Care at [phone] if you have  questions regarding  your ZIO XT patch monitor. Call them immediately if you see an orange light blinking on your  monitor.  If your monitor falls off in less than 4 days, contact our Monitor department at [phone].  If your monitor becomes loose or falls off after 4 days call [employer] at [phone] for  suggestions on securing your monitor    Follow-Up:   AS NEEDED WITH DR. Shari Prows HERE AT [facility]--WE WILL CALL YOU WITH YOUR TEST RESULTS

## 2021-10-15 ENCOUNTER — Ambulatory Visit: Payer: Medicaid Other | Attending: Cardiology

## 2021-10-15 ENCOUNTER — Telehealth: Payer: Self-pay | Admitting: *Deleted

## 2021-10-15 DIAGNOSIS — R002 Palpitations: Secondary | ICD-10-CM

## 2021-10-15 NOTE — Telephone Encounter (Signed)
-----   Message from Jennefer Bravo sent at 10/15/2021  8:34 AM EDT ----- Regarding: RE: 7 DAY ZIO FOR PER DR. Johney Frame done ----- Message ----- From: Nuala Alpha, LPN Sent: 6/38/4536   4:14 PM EDT To: Nuala Alpha, LPN; Shelly A Wells; # Subject: 7 DAY ZIO FOR PER DR. Johney Frame                Dr. Johney Frame saw this pt in [facility] today and ordered a 7 day zio for palp.  Please enroll and let me know when you do?  Thanks a ton, [employer]

## 2021-10-15 NOTE — Progress Notes (Unsigned)
Enrolled for [employer] to mail a ZIO XT long term holter monitor to the patients address on file.  

## 2021-10-18 ENCOUNTER — Ambulatory Visit (INDEPENDENT_AMBULATORY_CARE_PROVIDER_SITE_OTHER): Payer: Medicaid Other | Admitting: Advanced Practice Midwife

## 2021-10-18 VITALS — BP 125/76 | HR 86 | Wt 161.0 lb

## 2021-10-18 DIAGNOSIS — Z8759 Personal history of other complications of pregnancy, childbirth and the puerperium: Secondary | ICD-10-CM

## 2021-10-18 DIAGNOSIS — Z23 Encounter for immunization: Secondary | ICD-10-CM

## 2021-10-18 DIAGNOSIS — Z3403 Encounter for supervision of normal first pregnancy, third trimester: Secondary | ICD-10-CM

## 2021-10-18 DIAGNOSIS — Z87440 Personal history of urinary (tract) infections: Secondary | ICD-10-CM

## 2021-10-18 DIAGNOSIS — R002 Palpitations: Secondary | ICD-10-CM | POA: Insufficient documentation

## 2021-10-18 DIAGNOSIS — Z3A30 30 weeks gestation of pregnancy: Secondary | ICD-10-CM

## 2021-10-18 DIAGNOSIS — O26843 Uterine size-date discrepancy, third trimester: Secondary | ICD-10-CM

## 2021-10-18 LAB — POCT URINALYSIS DIPSTICK OB
Blood, UA: NEGATIVE
Glucose, UA: NEGATIVE
Ketones, UA: NEGATIVE
Leukocytes, UA: NEGATIVE
Nitrite, UA: NEGATIVE
POC,PROTEIN,UA: NEGATIVE

## 2021-10-18 MED ORDER — OMEPRAZOLE MAGNESIUM 20 MG PO TBEC: 20.0000 mg | DELAYED_RELEASE_TABLET | Freq: Every day | ORAL | 3 refills | Status: DC

## 2021-10-18 NOTE — Progress Notes (Addendum)
LOW-RISK PREGNANCY VISIT Patient name: Sarah Campbell MRN [id number]  Date of birth: 01-17-2001 Chief Complaint:   Routine Prenatal Visit  History of Present Illness:   Sarah Campbell is a 21 y.o. G42P0000 female at [redacted]w[redacted]d with an Estimated Date of Delivery: 12/26/21 being seen today for ongoing management of a low-risk pregnancy.  Today she reports heartburn. Contractions: Irritability. Vag. Bleeding: None.  Movement: Present. denies leaking of fluid. Review of Systems:   Pertinent items are noted in HPI Denies abnormal vaginal discharge w/ itching/odor/irritation, headaches, visual changes, shortness of breath, chest pain, abdominal pain, severe nausea/vomiting, or problems with urination or bowel movements unless otherwise stated above. Pertinent History Reviewed:  Reviewed past medical,surgical, social, obstetrical and family history.  Reviewed problem list, medications and allergies. Physical Assessment:   Vitals:   10/18/21 0916  BP: 125/76  Pulse: 86  Weight: 161 lb (73 kg)  Body mass index is 25.6 kg/m.        Physical Examination:   General appearance: Well appearing, and in no distress  Mental status: Alert, oriented to person, place, and time  Skin: Warm & dry  Cardiovascular: Normal heart rate noted  Respiratory: Normal respiratory effort, no distress  Abdomen: Soft, gravid, nontender  Pelvic: Cervical exam deferred         Extremities: Edema: None  Fetal Status: Fetal Heart Rate (bpm): 142 Fundal Height: 25 cm Movement: Present    Chaperone: n/a    Results for orders placed or performed in visit on 10/18/21 (from the past 24 hour(s))  POC Urinalysis Dipstick OB   Collection Time: 10/18/21  9:24 AM  Result Value Ref Range   Color, UA     Clarity, UA     Glucose, UA Negative Negative   Bilirubin, UA     Ketones, UA negative    Spec Grav, UA     Blood, UA negative    pH, UA     POC,PROTEIN,UA Negative Negative, Trace, Small (1+), Moderate (2+), Large  (3+), 4+   Urobilinogen, UA     Nitrite, UA negative    Leukocytes, UA Negative Negative   Appearance     Odor      Assessment & Plan:  1) Low-risk pregnancy G1P0000 at [redacted]w[redacted]d with an Estimated Date of Delivery: 12/26/21   2) heartburn, add priolosec  3)  Size<dates:  check EFW/AFI)  4)  heart palpitations:  picking up monitor today, to wear for 7 days. Has echo scheduled 10/3     Meds:  Meds ordered this encounter  Medications   omeprazole (PRILOSEC OTC) 20 MG tablet    Sig: Take 1 tablet (20 mg total) by mouth daily.    Dispense:  90 tablet    Refill:  3    Order Specific Question:   Supervising Provider    Answer:   Lazaro Arms [[id number]]   Labs/procedures today: none  Plan:  Continue routine obstetrical care  Next visit: prefers in person    Reviewed: Preterm labor symptoms and general obstetric precautions including but not limited to vaginal bleeding, contractions, leaking of fluid and fetal movement were reviewed in detail with the patient.  All questions were answered. Has home bp cuff.. Check bp weekly, let us know if >140/90.   Follow-up: Return in about 2 weeks (around 11/01/2021) for LROB (needs [place] for EFW/AFI with this visit if possible) if not, schedule when available.  Future Appointments  Date Time Provider Department Center  10/30/2021 11:35 AM  [id number]    Orders Placed This Encounter  Procedures   Urine Culture   US OB Follow Up   Tdap vaccine greater than or equal to 7yo IM   POC Urinalysis Dipstick OB   Christin Fudge DNP, CNM 10/18/2021 9:54 AM

## 2021-10-18 NOTE — Patient Instructions (Signed)
[place], I greatly value your feedback.  If you receive a survey following your visit with [place] today, we appreciate you taking the time to fill it out.  Thanks, Nigel Berthold, CNM   [place]!!! It is now [place] [facility] ([place], [place] [address]) Entrance located off of [address] parking   Go to [email or web address] to register for FREE online childbirth classes    Call the office [phone]) or go to [facility] if: You begin to have strong, frequent contractions Your water breaks.  Sometimes it is a big gush of fluid, sometimes it is just a trickle that keeps getting your panties wet or running down your legs You have vaginal bleeding.  It is normal to have a small amount of spotting if your cervix was checked.  You don't feel your baby moving like normal.  If you don't, get you something to eat and drink and lay down and focus on feeling your baby move.  You should feel at least 10 movements in 2 hours.  If you don't, you should call the office or go to [facility].    Tdap Vaccine It is recommended that you get the Tdap vaccine during the third trimester of EACH pregnancy to help protect your baby from getting pertussis (whooping cough) 27-36 weeks is the BEST time to do this so that you can pass the protection on to your baby. During pregnancy is better than after pregnancy, but if you are unable to get it during pregnancy it will be offered at the hospital.  You will be offered this vaccine in the office after 27 weeks. If you do not have health insurance, you can get this vaccine at the health department or your family doctor Everyone who will be around your baby should also be up-to-date on their vaccines. Adults (who are not pregnant) only need 1 dose of Tdap during adulthood.   Third Trimester of Pregnancy The third trimester is from week 29 through week 42, months 7 through 9. The third  trimester is a time when the fetus is growing rapidly. At the end of the ninth month, the fetus is about 20 inches in length and weighs 6-10 pounds.  BODY CHANGES Your body goes through many changes during pregnancy. The changes vary from woman to woman.  Your weight will continue to increase. You can expect to gain 25-35 pounds (11-16 kg) by the end of the pregnancy. You may begin to get stretch marks on your hips, abdomen, and breasts. You may urinate more often because the fetus is moving lower into your pelvis and pressing on your bladder. You may develop or continue to have heartburn as a result of your pregnancy. You may develop constipation because certain hormones are causing the muscles that push waste through your intestines to slow down. You may develop hemorrhoids or swollen, bulging veins (varicose veins). You may have pelvic pain because of the weight gain and pregnancy hormones relaxing your joints between the bones in your pelvis. Backaches may result from overexertion of the muscles supporting your posture. You may have changes in your hair. These can include thickening of your hair, rapid growth, and changes in texture. Some women also have hair loss during or after pregnancy, or hair that feels dry or thin. Your hair will most likely return to normal after your baby is born. Your breasts will continue to grow and be tender. A  yellow discharge may leak from your breasts called colostrum. Your belly button may stick out. You may feel short of breath because of your expanding uterus. You may notice the fetus "dropping," or moving lower in your abdomen. You may have a bloody mucus discharge. This usually occurs a few days to a week before labor begins. Your cervix becomes thin and soft (effaced) near your due date. WHAT TO EXPECT AT YOUR PRENATAL EXAMS  You will have prenatal exams every 2 weeks until week 36. Then, you will have weekly prenatal exams. During a routine prenatal  visit: You will be weighed to make sure you and the fetus are growing normally. Your blood pressure is taken. Your abdomen will be measured to track your baby's growth. The fetal heartbeat will be listened to. Any test results from the previous visit will be discussed. You may have a cervical check near your due date to see if you have effaced. At around 36 weeks, your caregiver will check your cervix. At the same time, your caregiver will also perform a test on the secretions of the vaginal tissue. This test is to determine if a type of bacteria, Group B streptococcus, is present. Your caregiver will explain this further. Your caregiver may ask you: What your birth plan is. How you are feeling. If you are feeling the baby move. If you have had any abnormal symptoms, such as leaking fluid, bleeding, severe headaches, or abdominal cramping. If you have any questions. Other tests or screenings that may be performed during your third trimester include: Blood tests that check for low iron levels (anemia). Fetal testing to check the health, activity level, and growth of the fetus. Testing is done if you have certain medical conditions or if there are problems during the pregnancy. FALSE LABOR You may feel small, irregular contractions that eventually go away. These are called Braxton Hicks contractions, or false labor. Contractions may last for hours, days, or even weeks before true labor sets in. If contractions come at regular intervals, intensify, or become painful, it is best to be seen by your caregiver.  SIGNS OF LABOR  Menstrual-like cramps. Contractions that are 5 minutes apart or less. Contractions that start on the top of the uterus and spread down to the lower abdomen and back. A sense of increased pelvic pressure or back pain. A watery or bloody mucus discharge that comes from the vagina. If you have any of these signs before the 37th week of pregnancy, call your caregiver right away.  You need to go to the hospital to get checked immediately. HOME CARE INSTRUCTIONS  Avoid all smoking, herbs, alcohol, and unprescribed drugs. These chemicals affect the formation and growth of the baby. Follow your caregiver's instructions regarding medicine use. There are medicines that are either safe or unsafe to take during pregnancy. Exercise only as directed by your caregiver. Experiencing uterine cramps is a good sign to stop exercising. Continue to eat regular, healthy meals. Wear a good support bra for breast tenderness. Do not use hot tubs, steam rooms, or saunas. Wear your seat belt at all times when driving. Avoid raw meat, uncooked cheese, cat litter boxes, and soil used by cats. These carry germs that can cause birth defects in the baby. Take your prenatal vitamins. Try taking a stool softener (if your caregiver approves) if you develop constipation. Eat more high-fiber foods, such as fresh vegetables or fruit and whole grains. Drink plenty of fluids to keep your urine clear or pale yellow.  Take warm sitz baths to soothe any pain or discomfort caused by hemorrhoids. Use hemorrhoid cream if your caregiver approves. If you develop varicose veins, wear support hose. Elevate your feet for 15 minutes, 3-4 times a day. Limit salt in your diet. Avoid heavy lifting, wear low heal shoes, and practice good posture. Rest a lot with your legs elevated if you have leg cramps or low back pain. Visit your dentist if you have not gone during your pregnancy. Use a soft toothbrush to brush your teeth and be gentle when you floss. A sexual relationship may be continued unless your caregiver directs you otherwise. Do not travel far distances unless it is absolutely necessary and only with the approval of your caregiver. Take prenatal classes to understand, practice, and ask questions about the labor and delivery. Make a trial run to the hospital. Pack your hospital bag. Prepare the baby's  nursery. Continue to go to all your prenatal visits as directed by your caregiver. SEEK MEDICAL CARE IF: You are unsure if you are in labor or if your water has broken. You have dizziness. You have mild pelvic cramps, pelvic pressure, or nagging pain in your abdominal area. You have persistent nausea, vomiting, or diarrhea. You have a bad smelling vaginal discharge. You have pain with urination. SEEK IMMEDIATE MEDICAL CARE IF:  You have a fever. You are leaking fluid from your vagina. You have spotting or bleeding from your vagina. You have severe abdominal cramping or pain. You have rapid weight loss or gain. You have shortness of breath with chest pain. You notice sudden or extreme swelling of your face, hands, ankles, feet, or legs. You have not felt your baby move in over an hour. You have severe headaches that do not go away with medicine. You have vision changes. Document Released: 01/08/2001 Document Revised: 01/19/2013 Document Reviewed: 03/17/2012 [facility] Patient Information 2015 [place], [place]. This information is not intended to replace advice given to you by your health care provider. Make sure you discuss any questions you have with your health care provider.

## 2021-10-23 LAB — URINE CULTURE

## 2021-10-24 ENCOUNTER — Inpatient Hospital Stay (HOSPITAL_COMMUNITY)
Admission: AD | Admit: 2021-10-24 | Discharge: 2021-10-25 | Disposition: A | Discharge status: Home / Self Care | Payer: Commercial Managed Care - HMO | Attending: Obstetrics & Gynecology | Admitting: Obstetrics & Gynecology

## 2021-10-24 ENCOUNTER — Encounter (HOSPITAL_COMMUNITY): Payer: Self-pay | Admitting: Obstetrics & Gynecology

## 2021-10-24 DIAGNOSIS — Z3A31 31 weeks gestation of pregnancy: Secondary | ICD-10-CM

## 2021-10-24 DIAGNOSIS — O99333 Smoking (tobacco) complicating pregnancy, third trimester: Secondary | ICD-10-CM | POA: Diagnosis not present

## 2021-10-24 DIAGNOSIS — M549 Dorsalgia, unspecified: Secondary | ICD-10-CM

## 2021-10-24 DIAGNOSIS — O26893 Other specified pregnancy related conditions, third trimester: Secondary | ICD-10-CM | POA: Insufficient documentation

## 2021-10-24 DIAGNOSIS — O4690 Antepartum hemorrhage, unspecified, unspecified trimester: Secondary | ICD-10-CM | POA: Diagnosis not present

## 2021-10-24 DIAGNOSIS — O4693 Antepartum hemorrhage, unspecified, third trimester: Secondary | ICD-10-CM | POA: Insufficient documentation

## 2021-10-24 DIAGNOSIS — O99891 Other specified diseases and conditions complicating pregnancy: Secondary | ICD-10-CM

## 2021-10-24 DIAGNOSIS — O4700 False labor before 37 completed weeks of gestation, unspecified trimester: Secondary | ICD-10-CM | POA: Diagnosis present

## 2021-10-24 LAB — URINALYSIS, ROUTINE W REFLEX MICROSCOPIC
Bilirubin Urine: NEGATIVE
Glucose, UA: NEGATIVE mg/dL
Hgb urine dipstick: NEGATIVE
Ketones, ur: NEGATIVE mg/dL
Nitrite: NEGATIVE
Protein, ur: NEGATIVE mg/dL
Specific Gravity, Urine: 1.006 (ref 1.005–1.030)
pH: 6 (ref 5.0–8.0)

## 2021-10-24 LAB — WET PREP, GENITAL
Sperm: NONE SEEN
Trich, Wet Prep: NONE SEEN
WBC, Wet Prep HPF POC: >=10 — AB (ref <10)
Yeast Wet Prep HPF POC: NONE SEEN

## 2021-10-24 MED ORDER — NIFEDIPINE 10 MG PO CAPS
10.0000 mg | ORAL_CAPSULE | ORAL | Status: AC | PRN
  Administered 2021-10-24 – 2021-10-25 (×4): 10 mg via ORAL
  Filled 2021-10-24 (×5): qty 1

## 2021-10-24 NOTE — MAU Provider Note (Signed)
Chief Complaint:  Vaginal Bleeding and Contractions   Event Date/Time   First Provider Initiated Contact with Patient 10/24/21 2319     HPI: Sarah Campbell is a 21 y.o. G1P0000 at 73w0dwho presents to maternity admissions reporting lower abdominal cramping with back pain for several weeks and vaginal bleeding with wiping today. . She reports good fetal movement, denies LOF, vaginal itching/burning, urinary symptoms, h/a, dizziness, n/v, diarrhea, constipation or fever/chills.   Vaginal Bleeding The patient's primary symptoms include pelvic pain and vaginal bleeding. The patient's pertinent negatives include no genital itching or genital odor. This is a new problem. The current episode started today. The pain is mild. She is pregnant. Associated symptoms include abdominal pain and back pain. Pertinent negatives include no constipation, diarrhea, dysuria, fever, headaches or nausea. The vaginal discharge was bloody. The vaginal bleeding is spotting. She has not been passing clots. She has not been passing tissue. Nothing aggravates the symptoms. She has tried nothing for the symptoms.   RN Note: Sarah Campbell is a 21 y.o. at [redacted]w[redacted]d here in MAU reporting: bleeding since 1930, bright red blood in her underwear and when she wiped.  Contractions on and off all day, has not been able to time them.  +FM Last intercourse: 3-4 months    Past Medical History: Past Medical History:  Diagnosis Date   Heart murmur    Medical history non-contributory     Past obstetric history: OB History  Gravida Para Term Preterm AB Living  1 0 0 0 0    SAB IAB Ectopic Multiple Live Births  0 0 0        # Outcome Date GA Lbr Len/2nd Weight Sex Delivery Anes PTL Lv  1 Current             Past Surgical History: Past Surgical History:  Procedure Laterality Date   NO PAST SURGERIES      Family History: Family History  Problem Relation Age of Onset   Alcoholism Father    Diabetes Maternal Grandfather     Heart attack Maternal Grandfather    Stroke Maternal Grandfather    Heart attack Paternal Grandmother    Heart attack Maternal Aunt    Stroke Maternal Aunt     Social History: Social History   Tobacco Use   Smoking status: Every Day    Types: E-cigarettes   Smokeless tobacco: Never  Vaping Use   Vaping Use: Every day   Substances: Nicotine  Substance Use Topics   Alcohol use: No   Drug use: No    Allergies: No Known Allergies  Meds:  Medications Prior to Admission  Medication Sig Dispense Refill Last Dose   acetaminophen (TYLENOL) 500 MG tablet Take 2 tablets (1,000 mg total) by mouth every 12 (twelve) hours as needed for fever or mild pain (for pain scale < 4  OR  temperature  >/=  100.5 F). 30 tablet 0 Past Week   docusate sodium (COLACE) 100 MG capsule Take 1 capsule (100 mg total) by mouth 2 (two) times daily as needed for mild constipation. 30 capsule 1 Past Week   ondansetron (ZOFRAN-ODT) 4 MG disintegrating tablet Take 1 tablet (4 mg total) by mouth every 8 (eight) hours as needed for nausea or vomiting. 30 tablet 0 10/23/2021 at 0730   omeprazole (PRILOSEC OTC) 20 MG tablet Take 1 tablet (20 mg total) by mouth daily. 90 tablet 3    prenatal vitamin w/FE, FA (PRENATAL 1 + 1) 27-1 MG  TABS tablet Take 1 tablet by mouth daily at 12 noon. 30 tablet 12     I have reviewed patient's Past Medical Hx, Surgical Hx, Family Hx, Social Hx, medications and allergies.   ROS:  Review of Systems  Constitutional:  Negative for fever.  Gastrointestinal:  Positive for abdominal pain. Negative for constipation, diarrhea and nausea.  Genitourinary:  Positive for pelvic pain and vaginal bleeding. Negative for dysuria.  Musculoskeletal:  Positive for back pain.  Neurological:  Negative for headaches.   Other systems negative  Physical Exam  Patient Vitals for the past 24 hrs:  BP Temp Temp src Pulse Resp SpO2 Height Weight  10/24/21 2313 129/81 -- -- 86 -- -- -- --  10/24/21 2251  135/87 97.7 F (36.5 C) Oral (!) 106 17 98 % 5' 6.5" (1.689 m) 74.9 kg   Constitutional: Well-developed, well-nourished female in no acute distress.  Cardiovascular: normal rate and rhythm Respiratory: normal effort, clear to auscultation bilaterally GI: Abd soft, non-tender, gravid appropriate for gestational age.   No rebound or guarding. MS: Extremities nontender, no edema, normal ROM Neurologic: Alert and oriented x 4.  GU: Neg CVAT.  PELVIC EXAM: Cervix pink, visually closed, without lesion, scant white creamy discharge, vaginal walls and external genitalia normal No blood seen on exam, no colored discharge.    Dilation: Closed Effacement (%): 30 Cervical Position: Posterior Station: -3 Exam by:: Hansel Feinstein, CNM No change after one hour  FHT:  Baseline 140 , moderate variability, accelerations present, no decelerations Contractions: Uterine irritability with irregular contractions.   Labs: Results for orders placed or performed during the hospital encounter of 10/24/21 (from the past 24 hour(s))  Urinalysis, Routine w reflex microscopic Urine, Clean Catch     Status: Abnormal   Collection Time: 10/24/21 11:11 PM  Result Value Ref Range   Color, Urine STRAW (A) YELLOW   APPearance HAZY (A) CLEAR   Specific Gravity, Urine 1.006 1.005 - 1.030   pH 6.0 5.0 - 8.0   Glucose, UA NEGATIVE NEGATIVE mg/dL   Hgb urine dipstick NEGATIVE NEGATIVE   Bilirubin Urine NEGATIVE NEGATIVE   Ketones, ur NEGATIVE NEGATIVE mg/dL   Protein, ur NEGATIVE NEGATIVE mg/dL   Nitrite NEGATIVE NEGATIVE   Leukocytes,Ua TRACE (A) NEGATIVE   RBC / HPF 0-5 0 - 5 RBC/hpf   WBC, UA 0-5 0 - 5 WBC/hpf   Bacteria, UA RARE (A) NONE SEEN   Squamous Epithelial / LPF 6-10 0 - 5   Mucus PRESENT   Wet prep, genital     Status: Abnormal   Collection Time: 10/24/21 11:29 PM  Result Value Ref Range   Yeast Wet Prep HPF POC NONE SEEN NONE SEEN   Trich, Wet Prep NONE SEEN NONE SEEN   Clue Cells Wet Prep HPF  POC PRESENT (A) NONE SEEN   WBC, Wet Prep HPF POC >=10 (A) <10   Sperm NONE SEEN     --/--/O POS (07/20 1704)  Imaging:  No results found.  MAU Course/MDM: I have reviewed the triage vital signs and the nursing notes.   Pertinent labs & imaging results that were available during my care of the patient were reviewed by me and considered in my medical decision making (see chart for details).      I have reviewed her medical records including past results, notes and treatments.   I have ordered labs and reviewed results. Well hydrated.  No blood in urine NST reviewed, reassuring  Treatments in MAU included  Procardia series Uterine contractions spaced out after 4 doses of Procardia Patient now only feels some pressure in back Has a history of back injury No change of cervix after recheck an hour later.    Assessment: Single IUP at [redacted]w[redacted]d Preterm uterine contractions Bleeding at home, none on exam, ?other source like rectal  Plan: Discharge home Preterm Labor precautions and fetal kick counts Follow up in Office for prenatal visits and recheck Rx Procardia for PRN use with PT UCs at home, come in if it does not work Rx Flexeril for back pain Encouraged to return if she develops worsening of symptoms, increase in pain, fever, or other concerning symptoms.   Pt stable at time of discharge.  Wynelle Bourgeois CNM, MSN Certified Nurse-Midwife 10/24/2021 11:19 PM

## 2021-10-24 NOTE — MAU Note (Signed)
..  Sarah Campbell is a 21 y.o. at [redacted]w[redacted]d here in MAU reporting: bleeding since 1930, bright red blood in her underwear and when she wiped.  Contractions on and off all day, has not been able to time them.  +FM Last intercourse: 3-4 months  Pain score: 8/10 Vitals:   10/24/21 2251  BP: 135/87  Pulse: (!) 106  Resp: 17  Temp: 97.7 F (36.5 C)  SpO2: 98%     FHT:145 Lab orders placed from triage:  UA

## 2021-10-25 DIAGNOSIS — O99891 Other specified diseases and conditions complicating pregnancy: Secondary | ICD-10-CM

## 2021-10-25 DIAGNOSIS — M549 Dorsalgia, unspecified: Secondary | ICD-10-CM

## 2021-10-25 DIAGNOSIS — O4690 Antepartum hemorrhage, unspecified, unspecified trimester: Secondary | ICD-10-CM

## 2021-10-25 DIAGNOSIS — Z3A31 31 weeks gestation of pregnancy: Secondary | ICD-10-CM | POA: Diagnosis not present

## 2021-10-25 DIAGNOSIS — O4700 False labor before 37 completed weeks of gestation, unspecified trimester: Secondary | ICD-10-CM | POA: Diagnosis not present

## 2021-10-25 MED ORDER — CYCLOBENZAPRINE HCL 5 MG PO TABS: 5.0000 mg | ORAL_TABLET | Freq: Three times a day (TID) | ORAL | 0 refills | Status: DC | PRN

## 2021-10-25 MED ORDER — NIFEDIPINE 10 MG PO CAPS: 10.0000 mg | ORAL_CAPSULE | Freq: Four times a day (QID) | ORAL | 1 refills | Status: DC | PRN

## 2021-10-30 ENCOUNTER — Ambulatory Visit (HOSPITAL_COMMUNITY): Payer: 59 | Attending: Cardiology

## 2021-10-30 DIAGNOSIS — R011 Cardiac murmur, unspecified: Secondary | ICD-10-CM | POA: Insufficient documentation

## 2021-10-30 LAB — ECHOCARDIOGRAM COMPLETE
Area-P 1/2: 2.24 cm2
S' Lateral: 2.5 cm

## 2021-10-31 ENCOUNTER — Encounter (HOSPITAL_COMMUNITY): Payer: Self-pay | Admitting: Obstetrics & Gynecology

## 2021-10-31 ENCOUNTER — Telehealth: Payer: Self-pay | Admitting: *Deleted

## 2021-10-31 ENCOUNTER — Emergency Department (HOSPITAL_COMMUNITY)
Admission: EM | Admit: 2021-10-31 | Discharge: 2021-10-31 | Disposition: A | Discharge status: Other Acute Inpatient Hospital | Payer: 59 | Attending: Emergency Medicine | Admitting: Emergency Medicine

## 2021-10-31 ENCOUNTER — Other Ambulatory Visit: Payer: Self-pay

## 2021-10-31 ENCOUNTER — Inpatient Hospital Stay (EMERGENCY_DEPARTMENT_HOSPITAL)
Admission: AD | Admit: 2021-10-31 | Discharge: 2021-10-31 | Disposition: A | Discharge status: Home / Self Care | Payer: 59 | Source: Home / Self Care | Attending: Obstetrics & Gynecology | Admitting: Obstetrics & Gynecology

## 2021-10-31 ENCOUNTER — Encounter (HOSPITAL_COMMUNITY): Payer: Self-pay | Admitting: *Deleted

## 2021-10-31 DIAGNOSIS — R030 Elevated blood-pressure reading, without diagnosis of hypertension: Secondary | ICD-10-CM

## 2021-10-31 DIAGNOSIS — R1030 Lower abdominal pain, unspecified: Secondary | ICD-10-CM | POA: Diagnosis not present

## 2021-10-31 DIAGNOSIS — O4703 False labor before 37 completed weeks of gestation, third trimester: Secondary | ICD-10-CM | POA: Insufficient documentation

## 2021-10-31 DIAGNOSIS — I3139 Other pericardial effusion (noninflammatory): Secondary | ICD-10-CM

## 2021-10-31 DIAGNOSIS — O479 False labor, unspecified: Secondary | ICD-10-CM

## 2021-10-31 DIAGNOSIS — O26893 Other specified pregnancy related conditions, third trimester: Secondary | ICD-10-CM | POA: Insufficient documentation

## 2021-10-31 DIAGNOSIS — M545 Low back pain, unspecified: Secondary | ICD-10-CM | POA: Insufficient documentation

## 2021-10-31 DIAGNOSIS — Z3A32 32 weeks gestation of pregnancy: Secondary | ICD-10-CM

## 2021-10-31 DIAGNOSIS — O4700 False labor before 37 completed weeks of gestation, unspecified trimester: Secondary | ICD-10-CM

## 2021-10-31 DIAGNOSIS — Z3493 Encounter for supervision of normal pregnancy, unspecified, third trimester: Secondary | ICD-10-CM

## 2021-10-31 LAB — BASIC METABOLIC PANEL
Anion gap: 9 (ref 5–15)
BUN: 11 mg/dL (ref 6–20)
CO2: 18 mmol/L — ABNORMAL LOW (ref 22–32)
Calcium: 8.4 mg/dL — ABNORMAL LOW (ref 8.9–10.3)
Chloride: 108 mmol/L (ref 98–111)
Creatinine, Ser: 0.76 mg/dL (ref 0.44–1.00)
GFR, Estimated: >60 mL/min (ref >60)
Glucose, Bld: 105 mg/dL — ABNORMAL HIGH (ref 70–99)
Potassium: 3.7 mmol/L (ref 3.5–5.1)
Sodium: 135 mmol/L (ref 135–145)

## 2021-10-31 LAB — COMPREHENSIVE METABOLIC PANEL
ALT: 10 U/L (ref 0–44)
AST: 17 U/L (ref 15–41)
Albumin: 2.5 g/dL — ABNORMAL LOW (ref 3.5–5.0)
Alkaline Phosphatase: 147 U/L — ABNORMAL HIGH (ref 38–126)
Anion gap: 7 (ref 5–15)
BUN: 12 mg/dL (ref 6–20)
CO2: 20 mmol/L — ABNORMAL LOW (ref 22–32)
Calcium: 8.5 mg/dL — ABNORMAL LOW (ref 8.9–10.3)
Chloride: 110 mmol/L (ref 98–111)
Creatinine, Ser: 0.75 mg/dL (ref 0.44–1.00)
GFR, Estimated: >60 mL/min (ref >60)
Glucose, Bld: 78 mg/dL (ref 70–99)
Potassium: 4.1 mmol/L (ref 3.5–5.1)
Sodium: 137 mmol/L (ref 135–145)
Total Bilirubin: 0.4 mg/dL (ref 0.3–1.2)
Total Protein: 5.7 g/dL — ABNORMAL LOW (ref 6.5–8.1)

## 2021-10-31 LAB — URINALYSIS, ROUTINE W REFLEX MICROSCOPIC
Bilirubin Urine: NEGATIVE
Glucose, UA: NEGATIVE mg/dL
Hgb urine dipstick: NEGATIVE
Ketones, ur: NEGATIVE mg/dL
Leukocytes,Ua: NEGATIVE
Nitrite: NEGATIVE
Protein, ur: NEGATIVE mg/dL
Specific Gravity, Urine: 1.014 (ref 1.005–1.030)
pH: 6 (ref 5.0–8.0)

## 2021-10-31 LAB — CBC
HCT: 31.4 % — ABNORMAL LOW (ref 36.0–46.0)
Hemoglobin: 10.3 g/dL — ABNORMAL LOW (ref 12.0–15.0)
MCH: 27.2 pg (ref 26.0–34.0)
MCHC: 32.8 g/dL (ref 30.0–36.0)
MCV: 82.8 fL (ref 80.0–100.0)
Platelets: 328 10*3/uL (ref 150–400)
RBC: 3.79 MIL/uL — ABNORMAL LOW (ref 3.87–5.11)
RDW: 13.7 % (ref 11.5–15.5)
WBC: 12.4 10*3/uL — ABNORMAL HIGH (ref 4.0–10.5)
nRBC: 0 % (ref 0.0–0.2)

## 2021-10-31 LAB — PROTEIN / CREATININE RATIO, URINE
Creatinine, Urine: 107 mg/dL
Protein Creatinine Ratio: 0.17 mg/mg{Cre} — ABNORMAL HIGH (ref 0.00–0.15)
Total Protein, Urine: 18 mg/dL

## 2021-10-31 LAB — WET PREP, GENITAL
Clue Cells Wet Prep HPF POC: NONE SEEN
Sperm: NONE SEEN
Trich, Wet Prep: NONE SEEN
WBC, Wet Prep HPF POC: <10 (ref <10)
Yeast Wet Prep HPF POC: NONE SEEN

## 2021-10-31 MED ORDER — SODIUM CHLORIDE 0.9 % IV BOLUS
500.0000 mL | Freq: Once | INTRAVENOUS | Status: AC
  Administered 2021-10-31: 500 mL via INTRAVENOUS

## 2021-10-31 MED ORDER — CYCLOBENZAPRINE HCL 5 MG PO TABS
10.0000 mg | ORAL_TABLET | Freq: Once | ORAL | Status: AC
  Administered 2021-10-31: 10 mg via ORAL
  Filled 2021-10-31: qty 2

## 2021-10-31 NOTE — ED Notes (Signed)
Report given to [facility]. 

## 2021-10-31 NOTE — Discharge Instructions (Signed)
Round Ligament Massage & Stretches  Massage: Starting at the middle of your pubic bone, trace little circles in a wide U from your pubic bone to your hip bones on both sides.  Then starting just above your pubic bone, press in and down, alternating sides to create a gentle rocking of your uterus back and forth.  Move your hands up the sides of your belly and back down. Do this 3-5 times upon waking and before bed.  Stretches: Get on hands and knees and alternate arching your back deeply while inhaling, and then rounding your back while exhaling. Modified runners lunge:  - Sit on a chair with half of your bottom on the chair and half off.  - Sit up tall, plant your front foot, and stretch your other foot out behind you.  - Breathe deeply for 5 breaths and then do the other side.    PREGNANCY SUPPORT BELT: You are not alone, Seventy-five percent of women have some sort of abdominal or back pain at some point in their pregnancy. Your baby is growing at a fast pace, which means that your whole body is rapidly trying to adjust to the changes. As your uterus grows, your back may start feeling a bit under stress and this can result in back or abdominal pain that can go from mild, and therefore bearable, to severe pains that will not allow you to sit or lay down comfortably, When it comes to dealing with pregnancy-related pains and cramps, some pregnant women usually prefer natural remedies, which the market is filled with nowadays. For example, wearing a pregnancy support belt can help ease and lessen your discomfort and pain. WHAT ARE THE BENEFITS OF WEARING A PREGNANCY SUPPORT BELT? A pregnancy support belt provides support to the lower portion of the belly taking some of the weight of the growing uterus and distributing to the other parts of your body. It is designed make you comfortable and gives you extra support. Over the years, the pregnancy apparel market has been studying the needs and wants of  pregnant women and they have come up with the most comfortable pregnancy support belts that woman could ever ask for. In fact, you will no longer have to wear a stretched-out or bulky pregnancy belt that is visible underneath your clothes and makes you feel even more uncomfortable. Nowadays, a pregnancy support belt is made of comfortable and stretchy materials that will not irritate your skin but will actually make you feel at ease and you will not even notice you are wearing it. They are easy to put on and adjust during the day and can be worn at night for additional support.  BENEFITS: Relives Back pain Relieves Abdominal Muscle and Leg Pain Stabilizes the Pelvic Ring Offers a Cushioned Abdominal Lift Pad Relieves pressure on the Sciatic Nerve Within Minutes WHERE TO GET YOUR PREGNANCY BELT: [facility] [phone] @[address] [place], [place] [address]  [facility]  [address], [place], [place] [address]  [phone]  [facility]  [address] [place], [place] [address]  [phone]  [facility]  [address] [place], [place] [address]  [phone]  [facility]  [address], [place], [place] [address]  [phone]  

## 2021-10-31 NOTE — MAU Note (Signed)
.  Sarah Campbell is a 21 y.o. at [redacted]w[redacted]d here in MAU reporting: pt is a [facility] transfer from [facility]. Presented there today with abd and back pain. On EFM was noted to be contracting and transferred here. Pt reports she was also having some pressure and thought she might be leaking some fluid. Reports positive fetal movement.   Onset of complaint: today Pain score: 7/10 lower abd                     8/10 lower back Vitals:   10/31/21 1800  BP: (!) 146/82  Pulse: 87  Resp: 15  Temp: 98.3 F (36.8 C)  SpO2: 100%     FHT:151 Lab orders placed from triage:

## 2021-10-31 NOTE — ED Triage Notes (Signed)
Pt reports she started feeling lower back pain around 1030 this morning and now the pain is also in her abdomen. Pt will be 32 weeks tomorrow. She was treated 1 week ago for preterm labor and given daily medication to take up until 35 weeks. Pt reports feeling as if she is "peeing on myself". Last week she was 50% effaced, but not dilated. [facility] is her [facility].

## 2021-10-31 NOTE — Progress Notes (Signed)
Monitors off.  Patient in route to [facility] MAU.

## 2021-10-31 NOTE — MAU Provider Note (Signed)
History     CSN: [id number]  Arrival date and time: 10/31/21 1755   Event Date/Time   First Provider Initiated Contact with Patient 10/31/21 1824      No chief complaint on file.  [place], a  21 y.o. G2P0010 at [redacted]w[redacted]d presents to MAU as a transfer from [facility] for a Preterm Labor rule out. Patient states she was at work and around 10 am began having back pain and pressure and "felt like she was peeing on herself." She was sent down to ED by her supervisor and worked up for PTL. Patient states she is having period like cramps that have been on-going and vaginal pressure. Currently rating pain 7/10. Endorses maternity support belt at work today. Endorse 4 bottles of water and IV fluids at [facility]. Patient is unsure of LOF d/t vagianl pressure. Denies abnormal vaginal discharge or urinary symptoms. Endorses positive fetal movement.        OB History     Gravida  2   Para  0   Term  0   Preterm  0   AB  1   Living  0      SAB  1   IAB  0   Ectopic  0   Multiple  0   Live Births  0           Past Medical History:  Diagnosis Date   Heart murmur    Medical history non-contributory     Past Surgical History:  Procedure Laterality Date   NO PAST SURGERIES      Family History  Problem Relation Age of Onset   Alcoholism Father    Diabetes Maternal Grandfather    Heart attack Maternal Grandfather    Stroke Maternal Grandfather    Heart attack Paternal Grandmother    Heart attack Maternal Aunt    Stroke Maternal Aunt     Social History   Tobacco Use   Smoking status: Former    Types: E-cigarettes   Smokeless tobacco: Never  Scientific laboratory technician Use: Every day   Substances: Nicotine  Substance Use Topics   Alcohol use: No   Drug use: No    Allergies: No Known Allergies  Medications Prior to Admission  Medication Sig Dispense Refill Last Dose   NIFEdipine (PROCARDIA) 10 MG capsule Take 1 capsule (10 mg total) by mouth every 6 (six) hours  as needed (contractions). 30 capsule 1 10/31/2021 at 1200   omeprazole (PRILOSEC OTC) 20 MG tablet Take 1 tablet (20 mg total) by mouth daily. 90 tablet 3 10/30/2021   prenatal vitamin w/FE, FA (PRENATAL 1 + 1) 27-1 MG TABS tablet Take 1 tablet by mouth daily at 12 noon. 30 tablet 12 10/31/2021   acetaminophen (TYLENOL) 500 MG tablet Take 2 tablets (1,000 mg total) by mouth every 12 (twelve) hours as needed for fever or mild pain (for pain scale < 4  OR  temperature  >/=  100.5 F). 30 tablet 0 Unknown   cyclobenzaprine (FLEXERIL) 5 MG tablet Take 1 tablet (5 mg total) by mouth 3 (three) times daily as needed for muscle spasms. 30 tablet 0 10/29/2021   docusate sodium (COLACE) 100 MG capsule Take 1 capsule (100 mg total) by mouth 2 (two) times daily as needed for mild constipation. 30 capsule 1    ondansetron (ZOFRAN-ODT) 4 MG disintegrating tablet Take 1 tablet (4 mg total) by mouth every 8 (eight) hours as needed for nausea or vomiting.  30 tablet 0     Review of Systems  Constitutional:  Negative for chills, fatigue and fever.  Eyes:  Negative for pain and visual disturbance.  Respiratory:  Negative for apnea, shortness of breath and wheezing.   Cardiovascular:  Negative for chest pain and palpitations.  Gastrointestinal:  Positive for abdominal pain. Negative for constipation, diarrhea, nausea and vomiting.  Genitourinary:  Positive for pelvic pain and vaginal pain. Negative for difficulty urinating, dysuria, vaginal bleeding and vaginal discharge.  Musculoskeletal:  Positive for back pain.  Neurological:  Negative for seizures, weakness and headaches.  Psychiatric/Behavioral:  Negative for suicidal ideas.    Physical Exam   Blood pressure 129/88, pulse 77, temperature 98.3 F (36.8 C), temperature source Oral, resp. rate 15, height 5\' 6"  (1.676 m), weight 74.8 kg, last menstrual period 03/21/2021, SpO2 98 %.  Physical Exam Vitals and nursing note reviewed. Exam conducted with a chaperone  present.  Constitutional:      General: She is not in acute distress.    Appearance: Normal appearance. She is well-developed.  HENT:     Head: Normocephalic.  Cardiovascular:     Rate and Rhythm: Normal rate and regular rhythm.  Pulmonary:     Effort: Pulmonary effort is normal.     Breath sounds: Normal breath sounds.  Abdominal:     Palpations: Abdomen is soft.     Tenderness: There is generalized abdominal tenderness. There is no right CVA tenderness, left CVA tenderness, guarding or rebound.  Genitourinary:    Vagina: Normal.     Cervix: Discharge present.     Comments: White vaginal discharge noted on exam.  Musculoskeletal:     Cervical back: Normal range of motion.  Skin:    General: Skin is warm and dry.     Capillary Refill: Capillary refill takes less than 2 seconds.  Neurological:     Mental Status: She is alert and oriented to person, place, and time.  Psychiatric:        Mood and Affect: Mood normal.    SVE: 0/50/Ballottable- S. Rollene Rotunda CNM   FHT: 145 bpm with moderate variability 15x15 accels and no decels noted.  Toco: Irregular ctx.   Patient Vitals for the past 24 hrs:  BP Temp Temp src Pulse Resp SpO2 Height Weight  10/31/21 2054 -- -- -- -- 15 -- -- --  10/31/21 2050 -- -- -- -- -- 100 % -- --  10/31/21 2049 128/82 -- -- 69 -- -- -- --  10/31/21 2040 -- -- -- -- -- 98 % -- --  10/31/21 2035 -- -- -- -- -- 100 % -- --  10/31/21 2030 -- -- -- -- -- 100 % -- --  10/31/21 2025 -- -- -- -- -- 98 % -- --  10/31/21 2020 -- -- -- -- -- 99 % -- --  10/31/21 2015 -- -- -- -- -- 100 % -- --  10/31/21 2010 -- -- -- -- -- 100 % -- --  10/31/21 2005 -- -- -- -- -- 99 % -- --  10/31/21 2000 -- -- -- -- -- 100 % -- --  10/31/21 1955 -- -- -- -- -- 100 % -- --  10/31/21 1950 -- -- -- -- -- 100 % -- --  10/31/21 1945 -- -- -- -- -- 100 % -- --  10/31/21 1940 -- -- -- -- -- 100 % -- --  10/31/21 1935 -- -- -- -- -- 100 % -- --  10/31/21 1930 -- -- -- -- --  99 % --  --  10/31/21 1920 -- -- -- -- -- 99 % -- --  10/31/21 1916 (!) 135/92 -- -- 77 -- -- -- --  10/31/21 1915 -- -- -- -- -- 99 % -- --  10/31/21 1910 -- -- -- -- -- 100 % -- --  10/31/21 1905 -- -- -- -- -- 100 % -- --  10/31/21 1901 (!) 117/94 -- -- 76 -- -- -- --  10/31/21 1900 -- -- -- -- -- 99 % -- --  10/31/21 1855 -- -- -- -- -- 99 % -- --  10/31/21 1850 -- -- -- -- -- 100 % -- --  10/31/21 1846 134/76 -- -- 64 -- -- -- --  10/31/21 1845 -- -- -- -- -- 100 % -- --  10/31/21 1840 -- -- -- -- -- 100 % -- --  10/31/21 1835 -- -- -- -- -- 100 % -- --  10/31/21 1831 126/79 -- -- 89 -- -- -- --  10/31/21 1830 -- -- -- -- -- 99 % -- --  10/31/21 1825 -- -- -- -- -- 99 % -- --  10/31/21 1820 -- -- -- -- -- 100 % -- --  10/31/21 1816 129/88 -- -- 77 -- -- -- --  10/31/21 1815 -- -- -- -- -- 100 % -- --  10/31/21 1810 (!) 131/90 -- -- 80 -- 98 % -- --  10/31/21 1805 -- -- -- -- -- 100 % -- --  10/31/21 1800 (!) 146/82 98.3 F (36.8 C) Oral 87 15 100 % 5\' 6"  (1.676 m) 74.8 kg    MAU Course  Procedures Orders Placed This Encounter  Procedures   Wet prep, genital   Comprehensive metabolic panel   Protein / creatinine ratio, urine   Fern Test   Discharge patient   Meds ordered this encounter  Medications   cyclobenzaprine (FLEXERIL) tablet 10 mg   Results for orders placed or performed during the hospital encounter of 10/31/21 (from the past 24 hour(s))  Comprehensive metabolic panel     Status: Abnormal   Collection Time: 10/31/21  6:33 PM  Result Value Ref Range   Sodium 137 135 - 145 mmol/L   Potassium 4.1 3.5 - 5.1 mmol/L   Chloride 110 98 - 111 mmol/L   CO2 20 (L) 22 - 32 mmol/L   Glucose, Bld 78 70 - 99 mg/dL   BUN 12 6 - 20 mg/dL   Creatinine, Ser 0.75 0.44 - 1.00 mg/dL   Calcium 8.5 (L) 8.9 - 10.3 mg/dL   Total Protein 5.7 (L) 6.5 - 8.1 g/dL   Albumin 2.5 (L) 3.5 - 5.0 g/dL   AST 17 15 - 41 U/L   ALT 10 0 - 44 U/L   Alkaline Phosphatase 147 (H) 38 - 126 U/L    Total Bilirubin 0.4 0.3 - 1.2 mg/dL   GFR, Estimated >60 >60 mL/min   Anion gap 7 5 - 15  Protein / creatinine ratio, urine     Status: Abnormal   Collection Time: 10/31/21  7:29 PM  Result Value Ref Range   Creatinine, Urine 107 mg/dL   Total Protein, Urine 18 mg/dL   Protein Creatinine Ratio 0.17 (H) 0.00 - 0.15 mg/mg[Cre]  Wet prep, genital     Status: None   Collection Time: 10/31/21  8:00 PM   Specimen: Cervix  Result Value Ref Range   Yeast Wet Prep HPF POC NONE SEEN NONE SEEN   Trich, Wet Prep  NONE SEEN NONE SEEN   Clue Cells Wet Prep HPF POC NONE SEEN NONE SEEN   WBC, Wet Prep HPF POC <10 <10   Sperm NONE SEEN     MDM UA- Normal  CBC (collected at outside facility) noted elevate WBC and HBG 10.3. Elevated BP son arrival to MAU,  CMP collected at [facility] r/o PreE.  Liver enzymes normal and PCR 0.17 Low suspicion for PreE.  Pain improved with PO Flexeril  Cervix unchanged from previous exam on 9/27.  Low suspicion for preterm labor  Fern Negative, low suspicion for SROM.  Plan for discharge   Assessment and Plan   1. Intact amniotic membranes during pregnancy in third trimester   2. False labor   3. [redacted] weeks gestation of pregnancy   4. Elevated BP without diagnosis of hypertension    - Discussed that membranes are intact and low suspicion for preterm labor. Patient reassured by findings.  - Discussed support options including maternity support belt and increase fluid intake.  - Discussed signs and symptoms of labor.  - Worsening signs and return precautions reviewed. - Preterm labor precautions reviewed - FHT Cat I upon discharge  - patient discharged home in stable condition and may return to MAU as needed.   Jacquiline Doe, MSN CNM  10/31/2021, 6:24 PM

## 2021-10-31 NOTE — Telephone Encounter (Signed)
The patient has been notified of the result and verbalized understanding.  All questions (if any) were answered.  Pt states her baby is due in late Nov.  Pt aware we will order her OB ECHO to be done sometime in Dec of this year.  Pt aware I will place the order for repeat OB ECHO to be done in Dec in the system and send a message to our Echo Scheduler to call her back and arrange this appt.   Pt verbalized understanding and agrees with this plan.

## 2021-10-31 NOTE — ED Provider Notes (Signed)
[facility] EMERGENCY DEPARTMENT Provider Note   CSN: [id number] Arrival date & time: 10/31/21  1419     History  Chief Complaint  Patient presents with   Possible Labor    Sarah Campbell is a 21 y.o. female.  HPI   Patient is G2, P0 who presents ED [redacted] weeks EGA with complaints of preterm labor and possible premature rupture membranes.  Patient states she was at work today.  Around 1030 she started with pain in her lower back.  Symptoms moved towards her lower abdomen and then she realized she was having possible contractions.  Patient also felt like she may have urinated on herself.  Last week she was 50% effaced but not dilated.  Home Medications Prior to Admission medications   Medication Sig Start Date End Date Taking? Authorizing Provider  acetaminophen (TYLENOL) 500 MG tablet Take 2 tablets (1,000 mg total) by mouth every 12 (twelve) hours as needed for fever or mild pain (for pain scale < 4  OR  temperature  >/=  100.5 F). 08/20/21   Osage Bing, MD  cyclobenzaprine (FLEXERIL) 5 MG tablet Take 1 tablet (5 mg total) by mouth 3 (three) times daily as needed for muscle spasms. 10/25/21   Aviva Signs, CNM  docusate sodium (COLACE) 100 MG capsule Take 1 capsule (100 mg total) by mouth 2 (two) times daily as needed for mild constipation. 08/20/21   Laplace Bing, MD  NIFEdipine (PROCARDIA) 10 MG capsule Take 1 capsule (10 mg total) by mouth every 6 (six) hours as needed (contractions). 10/25/21   Aviva Signs, CNM  omeprazole (PRILOSEC OTC) 20 MG tablet Take 1 tablet (20 mg total) by mouth daily. 10/18/21   Cresenzo-Dishmon, Scarlette Calico, CNM  ondansetron (ZOFRAN-ODT) 4 MG disintegrating tablet Take 1 tablet (4 mg total) by mouth every 8 (eight) hours as needed for nausea or vomiting. 08/20/21   Lavallette Bing, MD  prenatal vitamin w/FE, FA (PRENATAL 1 + 1) 27-1 MG TABS tablet Take 1 tablet by mouth daily at 12 noon. 04/30/21   [name], NP  omeprazole (PRILOSEC)  20 MG capsule Take 1 capsule (20 mg total) by mouth 2 (two) times daily. 06/24/18 08/10/18  Rennis Harding, PA-C      Allergies    Patient has no known allergies.    Review of Systems   Review of Systems  Physical Exam Updated Vital Signs BP 135/80 (BP Location: Left Arm)   Pulse 96   Temp 98.2 F (36.8 C) (Oral)   Resp 16   Ht 1.676 m (5\' 6" )   LMP 03/21/2021   SpO2 98%   BMI 26.66 kg/m  Physical Exam Vitals and nursing note reviewed.  Constitutional:      General: She is not in acute distress.    Appearance: She is well-developed.  HENT:     Head: Normocephalic and atraumatic.     Right Ear: External ear normal.     Left Ear: External ear normal.  Eyes:     General: No scleral icterus.       Right eye: No discharge.        Left eye: No discharge.     Conjunctiva/sclera: Conjunctivae normal.  Neck:     Trachea: No tracheal deviation.  Cardiovascular:     Rate and Rhythm: Normal rate.  Pulmonary:     Effort: Pulmonary effort is normal. No respiratory distress.     Breath sounds: No stridor.  Abdominal:     General:  There is no distension.     Tenderness: There is no abdominal tenderness.     Comments: Sterile bimanual exam, head palpated several cm from the introitus, cannot determine if there is any cervical dilation  Musculoskeletal:        General: No swelling or deformity.     Cervical back: Neck supple.  Skin:    General: Skin is warm and dry.     Findings: No rash.  Neurological:     Mental Status: She is alert.     Cranial Nerves: Cranial nerve deficit: no gross deficits.     ED Results / Procedures / Treatments   Labs (all labs ordered are listed, but only abnormal results are displayed) Labs Reviewed  CBC - Abnormal; Notable for the following components:      Result Value   WBC 12.4 (*)    RBC 3.79 (*)    Hemoglobin 10.3 (*)    HCT 31.4 (*)    All other components within normal limits  URINALYSIS, ROUTINE W REFLEX MICROSCOPIC  BASIC  METABOLIC PANEL    EKG None  Radiology ECHOCARDIOGRAM COMPLETE  Result Date: 10/30/2021    ECHOCARDIOGRAM REPORT   Patient Name:   Sarah Campbell Date of Exam: 10/30/2021 Medical Rec #:  [id number]       Height:       66.5 in Accession #:    [id number]      Weight:       165.2 lb Date of Birth:  01-Apr-2000       BSA:          1.854 m Patient Age:    21 years        BP:           112/71 mmHg Patient Gender: F               HR:           90 bpm. Exam Location:  [address] Procedure: 2D Echo, 3D Echo, Color Doppler, Cardiac Doppler and Strain Analysis Indications:    Murmur  History:        Patient has no prior history of Echocardiogram examinations.  Sonographer:    Thurman Coyer RDCS Referring Phys: Kathlynn Grate [place] IMPRESSIONS  1. Left ventricular ejection fraction, by estimation, is 55 to 60%. Left ventricular ejection fraction by 3D volume is 59 %. The left ventricle has normal function. The left ventricle has no regional wall motion abnormalities. Left ventricular diastolic  parameters were normal.  2. Right ventricular systolic function is normal. The right ventricular size is normal. There is normal pulmonary artery systolic pressure. The estimated right ventricular systolic pressure is 17.7 mmHg.  3. A small pericardial effusion is present.  4. The mitral valve is normal in structure. No evidence of mitral valve regurgitation.  5. The aortic valve was not well visualized. Aortic valve regurgitation is not visualized. No aortic stenosis is present.  6. The inferior vena cava is normal in size with greater than 50% respiratory variability, suggesting right atrial pressure of 3 mmHg. FINDINGS  Left Ventricle: Left ventricular ejection fraction, by estimation, is 55 to 60%. Left ventricular ejection fraction by 3D volume is 59 %. The left ventricle has normal function. The left ventricle has no regional wall motion abnormalities. The left ventricular internal cavity size was normal in size. There  is no left ventricular hypertrophy. Left ventricular diastolic parameters were normal. Right Ventricle: The right ventricular size is normal. No  increase in right ventricular wall thickness. Right ventricular systolic function is normal. There is normal pulmonary artery systolic pressure. The tricuspid regurgitant velocity is 1.92 m/s, and  with an assumed right atrial pressure of 3 mmHg, the estimated right ventricular systolic pressure is 73.2 mmHg. Left Atrium: Left atrial size was normal in size. Right Atrium: Right atrial size was normal in size. Pericardium: A small pericardial effusion is present. Mitral Valve: The mitral valve is normal in structure. No evidence of mitral valve regurgitation. Tricuspid Valve: The tricuspid valve is normal in structure. Tricuspid valve regurgitation is trivial. Aortic Valve: The aortic valve was not well visualized. Aortic valve regurgitation is not visualized. No aortic stenosis is present. Pulmonic Valve: The pulmonic valve was not well visualized. Pulmonic valve regurgitation is not visualized. Aorta: The aortic root and ascending aorta are structurally normal, with no evidence of dilitation. Venous: The inferior vena cava is normal in size with greater than 50% respiratory variability, suggesting right atrial pressure of 3 mmHg. IAS/Shunts: The interatrial septum was not well visualized.  LEFT VENTRICLE PLAX 2D LVIDd:         4.10 cm         Diastology LVIDs:         2.50 cm         LV e' medial:    9.46 cm/s LV PW:         1.00 cm         LV E/e' medial:  7.2 LV IVS:        0.80 cm         LV e' lateral:   12.50 cm/s LVOT diam:     2.20 cm         LV E/e' lateral: 5.5 LV SV:         70 LV SV Index:   38 LVOT Area:     3.80 cm        3D Volume EF                                LV 3D EF:    Left                                             ventricul                                             ar                                             ejection                                              fraction  by 3D                                             volume is                                             59 %.                                 3D Volume EF:                                3D EF:        59 %                                LV EDV:       138 ml                                LV ESV:       56 ml                                LV SV:        82 ml RIGHT VENTRICLE RV Basal diam:  3.40 cm RV Mid diam:    2.50 cm RV S prime:     13.10 cm/s TAPSE (M-mode): 1.5 cm LEFT ATRIUM             Index        RIGHT ATRIUM           Index LA diam:        3.10 cm 1.67 cm/m   RA Area:     12.50 cm LA Vol (A2C):   35.3 ml 19.04 ml/m  RA Volume:   25.20 ml  13.59 ml/m LA Vol (A4C):   41.9 ml 22.60 ml/m LA Biplane Vol: 38.9 ml 20.98 ml/m  AORTIC VALVE LVOT Vmax:   102.00 cm/s LVOT Vmean:  68.100 cm/s LVOT VTI:    0.185 m  AORTA Ao Root diam: 2.50 cm Ao Asc diam:  2.70 cm MITRAL VALVE               TRICUSPID VALVE MV Area (PHT): 2.24 cm    TR Peak grad:   14.7 mmHg MV Decel Time: 338 msec    TR Vmax:        192.00 cm/s MV E velocity: 68.40 cm/s MV A velocity: 53.30 cm/s  SHUNTS MV E/A ratio:  1.28        Systemic VTI:  0.18 m                            Systemic Diam: 2.20 cm Epifanio Lescheshristopher Schumann MD Electronically signed by Epifanio Lescheshristopher Schumann MD Signature Date/Time: 10/30/2021/2:05:17 PM    Final     Procedures Procedures    Medications Ordered in ED Medications  sodium chloride 0.9 % bolus  500 mL (500 mLs Intravenous New Bag/Given 10/31/21 1438)    ED Course/ Medical Decision Making/ A&P Clinical Course as of 10/31/21 1456  Wed Oct 31, 2021  1449 Reviewed case with Dr Ashok Pall.  Will transfer to [facility] MAU for evaluation. [JK]    Clinical Course User Index [JK] Linwood Dibbles, MD                           Medical Decision Making Problems Addressed: Preterm contractions: acute illness or injury that poses a threat to life or bodily  functions  Amount and/or Complexity of Data Reviewed Labs: ordered.   Patient presented to the ED with concerns of possible preterm labor.  On my exam there is no crowning, cervix is effaced.  Patient does not appear to be in signs of imminent delivery.  Will transfer down to [facility] for [facility] evaluation.  Case discussed with Dr. Ashok Pall        Final Clinical Impression(s) / ED Diagnoses Final diagnoses:  Preterm contractions    Rx / DC Orders ED Discharge Orders     None         Linwood Dibbles, MD 10/31/21 1457

## 2021-10-31 NOTE — Telephone Encounter (Signed)
-----   Message from Freada Bergeron, MD sent at 10/31/2021  8:23 AM EDT ----- Her echo looks great with normal pumping function, no significant valve disease. She has a small amount of fluid around the heart which should not cause any issues and we will just monitor with repeat echo following pregnancy.

## 2021-10-31 NOTE — Progress Notes (Signed)
Spoke with Dr Imagene Riches.  [facility] is transferring patient via EMS to MAU for further evaluation.  He will contact MAU and update them on her arrival and POC.

## 2021-10-31 NOTE — Progress Notes (Addendum)
G1P0 at 32 weeks reports to [facility] with c/o PTL.  Has been treated for PTL last week.  SVE last week was 0/50 Intact.  SVE performed by ED MD and sounds like his exam is similar.  No bleeding or leaking reported.  Receives [facility] at [facility] in [place]. Monitors being placed to assess FHT's and uterine activity. RROB updated on patient status.    Notified Dr Imagene Riches of patient status.  If patient is determined to be in PTL, transfer patient to MAU for further evaluation.    Pearletha Forge RNC-OB, RROB  [phone]

## 2021-11-01 LAB — GC/CHLAMYDIA PROBE AMP (~~LOC~~) NOT AT ARMC
Chlamydia: NEGATIVE
Comment: NEGATIVE
Comment: NORMAL
Neisseria Gonorrhea: NEGATIVE

## 2021-11-06 ENCOUNTER — Ambulatory Visit (INDEPENDENT_AMBULATORY_CARE_PROVIDER_SITE_OTHER): Payer: Medicaid Other | Admitting: Women's Health

## 2021-11-06 ENCOUNTER — Ambulatory Visit (INDEPENDENT_AMBULATORY_CARE_PROVIDER_SITE_OTHER): Payer: Commercial Managed Care - HMO

## 2021-11-06 ENCOUNTER — Encounter: Payer: Self-pay | Admitting: Women's Health

## 2021-11-06 ENCOUNTER — Other Ambulatory Visit: Payer: Self-pay | Admitting: Advanced Practice Midwife

## 2021-11-06 VITALS — BP 134/80 | HR 86 | Wt 165.0 lb

## 2021-11-06 DIAGNOSIS — O365990 Maternal care for other known or suspected poor fetal growth, unspecified trimester, not applicable or unspecified: Secondary | ICD-10-CM | POA: Insufficient documentation

## 2021-11-06 DIAGNOSIS — Z23 Encounter for immunization: Secondary | ICD-10-CM

## 2021-11-06 DIAGNOSIS — O0990 Supervision of high risk pregnancy, unspecified, unspecified trimester: Secondary | ICD-10-CM

## 2021-11-06 DIAGNOSIS — Z87898 Personal history of other specified conditions: Secondary | ICD-10-CM | POA: Insufficient documentation

## 2021-11-06 DIAGNOSIS — O26843 Uterine size-date discrepancy, third trimester: Secondary | ICD-10-CM

## 2021-11-06 DIAGNOSIS — O0993 Supervision of high risk pregnancy, unspecified, third trimester: Secondary | ICD-10-CM

## 2021-11-06 DIAGNOSIS — O2303 Infections of kidney in pregnancy, third trimester: Secondary | ICD-10-CM

## 2021-11-06 DIAGNOSIS — Z3A30 30 weeks gestation of pregnancy: Secondary | ICD-10-CM | POA: Diagnosis not present

## 2021-11-06 DIAGNOSIS — O4700 False labor before 37 completed weeks of gestation, unspecified trimester: Secondary | ICD-10-CM

## 2021-11-06 DIAGNOSIS — Z3403 Encounter for supervision of normal first pregnancy, third trimester: Secondary | ICD-10-CM

## 2021-11-06 DIAGNOSIS — Z3A32 32 weeks gestation of pregnancy: Secondary | ICD-10-CM

## 2021-11-06 DIAGNOSIS — O365930 Maternal care for other known or suspected poor fetal growth, third trimester, not applicable or unspecified: Secondary | ICD-10-CM

## 2021-11-06 DIAGNOSIS — R002 Palpitations: Secondary | ICD-10-CM

## 2021-11-06 LAB — POCT URINALYSIS DIPSTICK OB
Bilirubin, UA: NEGATIVE
Blood, UA: NEGATIVE
Glucose, UA: NEGATIVE
Ketones, UA: NEGATIVE
Leukocytes, UA: NEGATIVE
Nitrite, UA: NEGATIVE
POC,PROTEIN,UA: NEGATIVE
Spec Grav, UA: 1.01 (ref 1.010–1.025)
Urobilinogen, UA: 1 E.U./dL
pH, UA: 6 (ref 5.0–8.0)

## 2021-11-06 MED ORDER — FERROUS SULFATE 325 (65 FE) MG PO TABS: 325.0000 mg | ORAL_TABLET | ORAL | 2 refills | Status: DC

## 2021-11-06 NOTE — Patient Instructions (Signed)
Sarah Campbell, thank you for choosing our office today! We appreciate the opportunity to meet your healthcare needs. You may receive a short survey by mail, e-mail, or through [employer]. If you are happy with your care we would appreciate if you could take just a few minutes to complete the survey questions. We read all of your comments and take your feedback very seriously. Thank you again for choosing our office.  Center for [employer] Team at [place] at [facility] ([place], [place] [address]) Entrance C, located off of [address] parking   CLASSES: Go to [email or web address] to register for classes (childbirth, breastfeeding, waterbirth, infant CPR, daddy bootcamp, etc.)  Call the office [phone]) or go to [facility] if: You begin to have strong, frequent contractions Your water breaks.  Sometimes it is a big gush of fluid, sometimes it is just a trickle that keeps getting your panties wet or running down your legs You have vaginal bleeding.  It is normal to have a small amount of spotting if your cervix was checked.  You don't feel your baby moving like normal.  If you don't, get you something to eat and drink and lay down and focus on feeling your baby move.   If your baby is still not moving like normal, you should call the office or go to [facility].  Call the office [phone]) or go to [facility] hospital for these signs of pre-eclampsia: Severe headache that does not go away with Tylenol Visual changes- seeing spots, double, blurred vision Pain under your right breast or upper abdomen that does not go away with Tums or heartburn medicine Nausea and/or vomiting Severe swelling in your hands, feet, and face   Tdap Vaccine It is recommended that you get the Tdap vaccine during the third trimester of EACH pregnancy to help protect your baby from getting pertussis (whooping cough) 27-36 weeks is the BEST time to do  this so that you can pass the protection on to your baby. During pregnancy is better than after pregnancy, but if you are unable to get it during pregnancy it will be offered at the hospital.  You can get this vaccine with [place], at the health department, your family doctor, or some local pharmacies Everyone who will be around your baby should also be up-to-date on their vaccines before the baby comes. Adults (who are not pregnant) only need 1 dose of Tdap during adulthood.   [facility] Pediatricians/Family Doctors [facility]): [address] [place], [facility]: [address] [place], [phone]                [facility]): [place], [phone] (call to ask if accepting patients) [facility] Department: [address], [place], [place] Pediatricians/Family Doctors [facility]): [place], [address], [place] [facility]: [address] [place], [place] [facility]: [place], [place] [facility]): [phone] [facility]: [address], [place]: [address] [facility]: [phone], [phone]  Home Blood Pressure Monitoring for Patients   Your provider has recommended that you check your  blood pressure (BP) at least once a week at home. If you do not have a blood pressure cuff at home, one will be provided for you. Contact your provider if you have not received your monitor within 1 week.   Helpful Tips for Accurate Home Blood Pressure Checks  Don't smoke, exercise, or drink caffeine 30 minutes before checking your BP Use the restroom before checking your BP (a full bladder can raise your  pressure) Relax in a comfortable upright chair Feet on the ground Left arm resting comfortably on a flat surface at the level of your heart Legs uncrossed Back supported Sit quietly and don't talk Place the cuff on your bare arm Adjust snuggly, so that only two fingertips can fit between your skin and the top of the cuff Check 2 readings separated by at least one minute Keep a log of your BP readings For a visual, please reference this diagram: http://ccnc.care/bpdiagram  Provider Name: [facility]     Phone: [phone]  Zone 1: ALL CLEAR  Continue to monitor your symptoms:  BP reading is less than 140 (top number) or less than 90 (bottom number)  No right upper stomach pain No headaches or seeing spots No feeling nauseated or throwing up No swelling in face and hands  Zone 2: CAUTION Call your doctor's office for any of the following:  BP reading is greater than 140 (top number) or greater than 90 (bottom number)  Stomach pain under your ribs in the middle or right side Headaches or seeing spots Feeling nauseated or throwing up Swelling in face and hands  Zone 3: EMERGENCY  Seek immediate medical care if you have any of the following:  BP reading is greater than160 (top number) or greater than 110 (bottom number) Severe headaches not improving with Tylenol Serious difficulty catching your breath Any worsening symptoms from Zone 2  Preterm Labor and Birth Information  The normal length of a pregnancy is 39-41 weeks. Preterm labor is when labor starts before 37 completed weeks of pregnancy. What are the risk factors for preterm labor? Preterm labor is more likely to occur in women who: Have certain infections during pregnancy such as a bladder infection, sexually transmitted infection, or infection inside the uterus (chorioamnionitis). Have a shorter-than-normal cervix. Have gone into preterm labor before. Have had surgery on their cervix. Are younger than age 17  or older than age 35. Are African American. Are pregnant with twins or multiple babies (multiple gestation). Take street drugs or smoke while pregnant. Do not gain enough weight while pregnant. Became pregnant shortly after having been pregnant. What are the symptoms of preterm labor? Symptoms of preterm labor include: Cramps similar to those that can happen during a menstrual period. The cramps may happen with diarrhea. Pain in the abdomen or lower back. Regular uterine contractions that may feel like tightening of the abdomen. A feeling of increased pressure in the pelvis. Increased watery or bloody mucus discharge from the vagina. Water breaking (ruptured amniotic sac). Why is it important to recognize signs of preterm labor? It is important to recognize signs of preterm labor because babies who are born prematurely may not be fully developed. This can put them at an increased risk for: Long-term (chronic) heart and lung problems. Difficulty immediately after birth with regulating body systems, including blood sugar, body temperature, heart rate, and breathing rate. Bleeding in the brain. Cerebral palsy. Learning difficulties. Death. These risks are highest for babies who are born before 34 weeks   of pregnancy. How is preterm labor treated? Treatment depends on the length of your pregnancy, your condition, and the health of your baby. It may involve: Having a stitch (suture) placed in your cervix to prevent your cervix from opening too early (cerclage). Taking or being given medicines, such as: Hormone medicines. These may be given early in pregnancy to help support the pregnancy. Medicine to stop contractions. Medicines to help mature the baby's lungs. These may be prescribed if the risk of delivery is high. Medicines to prevent your baby from developing cerebral palsy. If the labor happens before 34 weeks of pregnancy, you may need to stay in the hospital. What should I do if I  think I am in preterm labor? If you think that you are going into preterm labor, call your health care provider right away. How can I prevent preterm labor in future pregnancies? To increase your chance of having a full-term pregnancy: Do not use any tobacco products, such as cigarettes, chewing tobacco, and e-cigarettes. If you need help quitting, ask your health care provider. Do not use street drugs or medicines that have not been prescribed to you during your pregnancy. Talk with your health care provider before taking any herbal supplements, even if you have been taking them regularly. Make sure you gain a healthy amount of weight during your pregnancy. Watch for infection. If you think that you might have an infection, get it checked right away. Make sure to tell your health care provider if you have gone into preterm labor before. This information is not intended to replace advice given to you by your health care provider. Make sure you discuss any questions you have with your health care provider. Document Revised: 05/08/2018 Document Reviewed: 06/07/2015 Elsevier Patient Education  [place].  Fetal Growth Restriction  Fetal growth restriction, also known as intrauterine growth restriction (IUGR), is when a baby is not growing normally during pregnancy. A baby with fetal growth restriction is smaller than he or she should be and may weigh less than normal at birth. Fetal growth restriction can result from a problem with the placenta, which is an organ that supplies the unborn baby (fetus) with oxygen and nutrition. Babies with fetal growth restriction are at higher risk for early delivery and may need more care than usual after birth. What are the causes? The most common cause of fetal growth restriction is a problem with the placenta or umbilical cord that causes the fetus to get less oxygen or nutrition than needed. Other causes include: Poor maternal nutrition and not enough  weight gain during pregnancy. Exposure to chemicals found in substances such as cigarettes, alcohol, and some drugs. Some prescription medicines. Other problems that develop in the womb (congenital birth defects). Genetic disorders. Infection. Carrying more than one baby. What increases the risk? This condition is more likely to affect your baby if you: Are older than age 66 or younger than age 68. Have medical conditions such as high blood pressure, preeclampsia, diabetes, heart or kidney disease, systemic lupus erythematosus, or anemia. Live at a very high altitude during pregnancy. Have a personal history or family history of: Fetal growth restriction. A genetic disorder. Have had treatments to help have children (infertility treatments). What are the signs or symptoms? Fetal growth restriction does not cause many symptoms. Your health care provider may suspect this condition if your belly area (fundus) is not as big as expected for the stage of your pregnancy. How is this diagnosed? This condition  is diagnosed with physical exams and prenatal exams. You may also have: Fundal height measurements to check the size of your uterus. The fundal height is the distance from the pubic bone to the top of the uterus. An ultrasound done to measure your baby's size compared to the size of other babies at the same stage of development (gestational age). You may also have tests to find the cause of fetal growth restriction. These may include: Amniocentesis. This is a procedure that involves passing a needle into the uterus to collect a sample of fluid that surrounds the fetus (amniotic fluid). This may be done to check for signs of infection or congenital defects. Tests to evaluate blood flow to your baby and placenta. How is this treated? In most cases, the goal of treatment is to treat the cause of fetal growth restriction. Your health care providers will monitor your pregnancy closely and help you  manage your pregnancy. If your condition is caused by a problem with the placenta and your baby is not getting enough blood, you may need: Medicine to start labor and deliver your baby early (induction). Cesarean delivery, also called a C-section. In this procedure, your baby is delivered through an incision in your abdomen and uterus. Follow these instructions at home: Medicines Take over-the-counter and prescription medicines only as told by your health care provider. This includes vitamins and supplements. Make sure that your health care provider knows about and approves of all medicines, supplements, vitamins, eye drops, and creams that you use. General instructions Eat a healthy diet that includes fresh fruits and vegetables, lean proteins, whole grains, and calcium-rich foods such as milk, yogurt, and dark, leafy greens. Work with your health care provider or a dietitian to make sure that: You are getting enough nutrients. You are gaining enough weight during your pregnancy. Rest as needed. Try to get at least 8 hours of sleep every night. Do not drink alcohol or use drugs. Do not use any products that contain nicotine or tobacco. These products include cigarettes, chewing tobacco, and vaping devices, such as e-cigarettes. If you need help quitting, ask your health care provider. Keep all follow-up visits. This is important. Get help right away if: You notice that your unborn baby is moving less than usual or is not moving. You have contractions that are 5 minutes or less apart, or that increase in frequency, intensity, or length. You have signs and symptoms of infection, including a fever. You have vaginal bleeding. You have increased swelling in your legs, hands, or face. You have vision changes, including seeing spots or having blurry or double vision. You have a severe headache that does not go away. You have a sudden, sharp pain in the abdomen or low back pain. You have an  uncontrolled gush or trickle of fluid from your vagina. Summary Fetal growth restriction is when a baby is not growing normally during pregnancy. The most common cause of fetal growth restriction is a problem with the placenta or umbilical cord that causes the fetus to get less oxygen or nutrition than needed. This condition is diagnosed with physical and prenatal exams. Your health care provider will monitor your baby's growth with ultrasounds throughout pregnancy. Make sure that your health care provider knows about and approves of all medicines, supplements, vitamins, eye drops, and creams that you use. This information is not intended to replace advice given to you by your health care provider. Make sure you discuss any questions you have with your health  care provider. Document Revised: 09/06/2019 Document Reviewed: 09/06/2019 Elsevier Patient Education  2023 [employer].

## 2021-11-06 NOTE — Progress Notes (Signed)
[place] 81+8 wks,cephalic,anterior placenta gr 3,AFI 9.6 cm,FHR 152 bpm,EFW 1890 g 19.7%,AC 5.6%,BPP 8/8

## 2021-11-06 NOTE — Progress Notes (Signed)
[facility] PREGNANCY VISIT Patient name: Sarah Campbell MRN [id number]  Date of birth: 2000-11-23 Chief Complaint:   Routine Prenatal Visit  History of Present Illness:   Sarah Campbell is a 21 y.o. G2P0010 female at [redacted]w[redacted]d with an Estimated Date of Delivery: 12/26/21 being seen today for ongoing management of a high-risk pregnancy complicated by fetal growth restriction AC 5.6% (EFW 19%) today.    Today she reports  has had multiple MAU visits d/t PTL, last SVE on 10/4 was 0/50/ballotable, has prn procardia rx, pretty much has to take q6hr as it wears off around then and starts contracting again. Still working as Chartered certified accountant @ [employer] and contractions are much worse when working. On 10/4 MAU visit bp was also elevated, labs were normal. Home bp's have been normal . Denies ha, visual changes, ruq/epigastric pain, n/v. Reports had echo w/ cardiology on 10/3 for tachycardia & murmur, EF 55-60%, has small pericardial effusion.  Contractions: Irritability. Vag. Bleeding: None.  Movement: Present. denies leaking of fluid.      09/20/2021    9:36 AM 06/18/2021    2:30 PM 05/30/2020   10:43 AM 12/31/2017    1:40 PM 10/15/2017   12:01 PM  Depression screen PHQ 2/9  Decreased Interest 0 0 0 0 0  Down, Depressed, Hopeless 0 0 0 0 0  PHQ - 2 Score 0 0 0 0 0  Altered sleeping 0 2 1    Tired, decreased energy 0 0 1    Change in appetite 0 0 0    Feeling bad or failure about yourself  0 0 0    Trouble concentrating 0 0 0    Moving slowly or fidgety/restless 0 0 0    Suicidal thoughts 0 0 0    PHQ-9 Score 0 2 2          09/20/2021    9:37 AM 06/18/2021    2:30 PM 05/30/2020   10:43 AM  GAD 7 : Generalized Anxiety Score  Nervous, Anxious, on Edge 0 0 0  Control/stop worrying 0 0 0  Worry too much - different things 0 0 0  Trouble relaxing 0 0 1  Restless 0 0 0  Easily annoyed or irritable 0 0 0  Afraid - awful might happen 0 0 0  Total GAD 7 Score 0 0 1     Review of Systems:   Pertinent items are  noted in HPI Denies abnormal vaginal discharge w/ itching/odor/irritation, headaches, visual changes, shortness of breath, chest pain, abdominal pain, severe nausea/vomiting, or problems with urination or bowel movements unless otherwise stated above. Pertinent History Reviewed:  Reviewed past medical,surgical, social, obstetrical and family history.  Reviewed problem list, medications and allergies. Physical Assessment:   Vitals:   11/06/21 1606  BP: 134/80  Pulse: 86  Weight: 165 lb (74.8 kg)  Body mass index is 26.63 kg/m.           Physical Examination:   General appearance: alert, well appearing, and in no distress  Mental status: alert, oriented to person, place, and time  Skin: warm & dry   Extremities: Edema: None    Cardiovascular: normal heart rate noted  Respiratory: normal respiratory effort, no distress  Abdomen: gravid, soft, non-tender  Pelvic: Cervical exam deferred         Fetal Status: Fetal Heart Rate (bpm): 152 u/s   Movement: Present    Fetal Surveillance Testing today: [place] [id number] wks,cephalic,anterior placenta gr 3,AFI 9.6 cm,FHR  152 bpm,EFW 1890 g 19.7%,AC 5.6%,BPP 8/8,RI .61,.64,.57=54%  Chaperone: N/A    Results for orders placed or performed in visit on 11/06/21 (from the past 24 hour(s))  POC Urinalysis Dipstick OB   Collection Time: 11/06/21  4:25 PM  Result Value Ref Range   Color, UA     Clarity, UA     Glucose, UA Negative Negative   Bilirubin, UA Negative    Ketones, UA Negative    Spec Grav, UA 1.010 1.010 - 1.025   Blood, UA Negative    pH, UA 6.0 5.0 - 8.0   POC,PROTEIN,UA Negative Negative, Trace, Small (1+), Moderate (2+), Large (3+), 4+   Urobilinogen, UA 1.0 0.2 or 1.0 E.U./dL   Nitrite, UA Negative    Leukocytes, UA Negative Negative   Appearance     Odor      Assessment & Plan:  High-risk pregnancy: G2P0010 at [redacted]w[redacted]d with an Estimated Date of Delivery: 12/26/21   1) FGR dx today, AC 5.6% (EFW 19%), normal UAD, BPP 8/8,  discussed, will start antenatal testing  2) Preterm contractions, w/ multiple MAU visits for PTL, last SVE cl/50/ballotable on 10/4, continue procardia prn q6hr, stay hydrated, reviewed ptl s/s, reasons to seek care. Contractions worse at work (NT @ [employer]), in light of this and all other factors, begin maternity leave now- note written.  3) Elevated BP in MAU on 10/4> borderline today, asymptomatic, no proteinuria. Check bp daily at home, if >140/90 or pre-e s/s, let us know  4) Tachycardia, murmur, small pericardial effusion w/ normal EF> per cardiology repeat echo pp  5) H/O pyelo during pregnancy> urine dipstick neg today, urine cx q 4wks (last one neg 2wks ago)  6) Anemia> last hgb 10.3, start Fe QOD  Meds:  Meds ordered this encounter  Medications   ferrous sulfate 325 (65 FE) MG tablet    Sig: Take 1 tablet (325 mg total) by mouth every other day.    Dispense:  45 tablet    Refill:  2    Order Specific Question:   Supervising Provider    Answer:   Florian Buff [[id number]]    Labs/procedures today: flu shot and U/S  Treatment Plan:  weekly BPP/dopp, EFW q3wks, deliver 38-39wks  Reviewed: Preterm labor symptoms and general obstetric precautions including but not limited to vaginal bleeding, contractions, leaking of fluid and fetal movement were reviewed in detail with the patient.  All questions were answered. Does have home bp cuff. Office bp cuff given: not applicable. Check bp daily, let us know if consistently >140 and/or >90.  Follow-up: Return for weekly bpp/dopp & HROB w/ MD or CNM on Tuesdays, NST w/ nurse on Fridays.   No future appointments.  Orders Placed This Encounter  Procedures   Flu Vaccine QUAD 80mo+IM (Fluarix, Fluzone & Alfiuria Quad PF)   POC Urinalysis Dipstick OB   Roma Schanz CNM, [facility] 11/06/2021 5:01 PM

## 2021-11-07 ENCOUNTER — Other Ambulatory Visit: Payer: Self-pay | Admitting: Obstetrics & Gynecology

## 2021-11-07 DIAGNOSIS — O365930 Maternal care for other known or suspected poor fetal growth, third trimester, not applicable or unspecified: Secondary | ICD-10-CM

## 2021-11-07 NOTE — Progress Notes (Signed)
Orders placed for [place] to be done at [facility] due to availability

## 2021-11-13 ENCOUNTER — Ambulatory Visit (INDEPENDENT_AMBULATORY_CARE_PROVIDER_SITE_OTHER): Payer: 59 | Admitting: Obstetrics & Gynecology

## 2021-11-13 ENCOUNTER — Encounter: Payer: Self-pay | Admitting: Obstetrics & Gynecology

## 2021-11-13 ENCOUNTER — Other Ambulatory Visit: Payer: Medicaid Other

## 2021-11-13 VITALS — BP 132/86 | HR 90 | Wt 169.0 lb

## 2021-11-13 DIAGNOSIS — O365930 Maternal care for other known or suspected poor fetal growth, third trimester, not applicable or unspecified: Secondary | ICD-10-CM | POA: Diagnosis not present

## 2021-11-13 DIAGNOSIS — O0990 Supervision of high risk pregnancy, unspecified, unspecified trimester: Secondary | ICD-10-CM | POA: Diagnosis not present

## 2021-11-13 DIAGNOSIS — Z3A33 33 weeks gestation of pregnancy: Secondary | ICD-10-CM

## 2021-11-13 DIAGNOSIS — O2302 Infections of kidney in pregnancy, second trimester: Secondary | ICD-10-CM | POA: Diagnosis not present

## 2021-11-13 DIAGNOSIS — O1213 Gestational proteinuria, third trimester: Secondary | ICD-10-CM | POA: Diagnosis not present

## 2021-11-13 LAB — POCT URINALYSIS DIPSTICK OB
Blood, UA: NEGATIVE
Glucose, UA: NEGATIVE
Ketones, UA: NEGATIVE
Leukocytes, UA: NEGATIVE
Nitrite, UA: NEGATIVE

## 2021-11-13 NOTE — Progress Notes (Signed)
HIGH-RISK PREGNANCY VISIT Patient name: Sarah Campbell MRN [id number]  Date of birth: [birth year] Chief Complaint:   Routine Prenatal Visit  History of Present Illness:   Sarah Campbell is a 21 y.o. G34P0010 female at [redacted]w[redacted]d with an Estimated Date of Delivery: 12/26/21 being seen today for ongoing management of a high-risk pregnancy complicated by FGR, AC<10%(5.6%).    Today she reports no complaints. Contractions: Irritability. Vag. Bleeding: None.  Movement: Present. denies leaking of fluid.      09/20/2021    9:36 AM 06/18/2021    2:30 PM 05/30/2020   10:43 AM 12/31/2017    1:40 PM 10/15/2017   12:01 PM  Depression screen PHQ 2/9  Decreased Interest 0 0 0 0 0  Down, Depressed, Hopeless 0 0 0 0 0  PHQ - 2 Score 0 0 0 0 0  Altered sleeping 0 2 1    Tired, decreased energy 0 0 1    Change in appetite 0 0 0    Feeling bad or failure about yourself  0 0 0    Trouble concentrating 0 0 0    Moving slowly or fidgety/restless 0 0 0    Suicidal thoughts 0 0 0    PHQ-9 Score 0 2 2          09/20/2021    9:37 AM 06/18/2021    2:30 PM 05/30/2020   10:43 AM  GAD 7 : Generalized Anxiety Score  Nervous, Anxious, on Edge 0 0 0  Control/stop worrying 0 0 0  Worry too much - different things 0 0 0  Trouble relaxing 0 0 1  Restless 0 0 0  Easily annoyed or irritable 0 0 0  Afraid - awful might happen 0 0 0  Total GAD 7 Score 0 0 1     Review of Systems:   Pertinent items are noted in HPI Denies abnormal vaginal discharge w/ itching/odor/irritation, headaches, visual changes, shortness of breath, chest pain, abdominal pain, severe nausea/vomiting, or problems with urination or bowel movements unless otherwise stated above. Pertinent History Reviewed:  Reviewed past medical,surgical, social, obstetrical and family history.  Reviewed problem list, medications and allergies. Physical Assessment:   Vitals:   11/13/21 1503  BP: 132/86  Pulse: 90  Weight: 169 lb (76.7 kg)  Body mass  index is 27.28 kg/m.           Physical Examination:   General appearance: alert, well appearing, and in no distress  Mental status: alert, oriented to person, place, and time  Skin: warm & dry   Extremities: Edema: Trace    Cardiovascular: normal heart rate noted  Respiratory: normal respiratory effort, no distress  Abdomen: gravid, soft, non-tender  Pelvic: Cervical exam deferred         Fetal Status:     Movement: Present    Fetal Surveillance Testing today:  Sarah Campbell is at [redacted]w[redacted]d Estimated Date of Delivery: 12/26/21  NST being performed due to FGR  Today the NST is Reactive  Fetal Monitoring:  Baseline: 140 bpm, Variability: Good {> 6 bpm), Accelerations: Reactive, and Decelerations: Absent   reactive  The accelerations are >15 bpm and more than 2 in 20 minutes  Final diagnosis:  Reactive NST  Lazaro Arms, MD     Chaperone: N/A    Results for orders placed or performed in visit on 11/13/21 (from the past 24 hour(s))  POC Urinalysis Dipstick OB   Collection Time: 11/13/21  3:15  PM  Result Value Ref Range   Color, UA     Clarity, UA     Glucose, UA Negative Negative   Bilirubin, UA     Ketones, UA negative    Spec Grav, UA     Blood, UA negative    pH, UA     POC,PROTEIN,UA Large (3+) Negative, Trace, Small (1+), Moderate (2+), Large (3+), 4+   Urobilinogen, UA     Nitrite, UA negative    Leukocytes, UA Negative Negative   Appearance     Odor      Assessment & Plan:  High-risk pregnancy: G2P0010 at [redacted]w[redacted]d with an Estimated Date of Delivery: 12/26/21      ICD-10-CM   1. Supervision of high risk pregnancy, antepartum  O09.90 POC Urinalysis Dipstick OB    Protein / creatinine ratio, urine    2. Pyelonephritis affecting pregnancy in second trimester  O23.02 POC Urinalysis Dipstick OB    3. Poor fetal growth affecting management of mother in third trimester, single or unspecified fetus  O36.5930    AC 5.6%-->normal Dopplers    4. Proteinuria  affecting pregnancy in third trimester  O12.13 Protein / creatinine ratio, urine        Meds: No orders of the defined types were placed in this encounter.   Orders:  Orders Placed This Encounter  Procedures   Protein / creatinine ratio, urine   POC Urinalysis Dipstick OB     Labs/procedures today: NST  Treatment Plan:  twice weekly surveillance, sonogram alternating with NST  Reviewed: Preterm labor symptoms and general obstetric precautions including but not limited to vaginal bleeding, contractions, leaking of fluid and fetal movement were reviewed in detail with the patient.  All questions were answered. Does have home bp cuff. Office bp cuff given: not applicable. Check bp daily, let us know if consistently >140 and/or >90.  Follow-up: Return for keep scheduled.   Future Appointments  Date Time Provider [place]  11/16/2021 11:30 AM [facility] NURSE [facility]  11/20/2021  7:15 AM [facility] NURSE [facility]  11/20/2021  7:30 AM [facility] US2 [facility]  11/23/2021 11:30 AM [facility] NURSE [facility]  11/27/2021 10:30 AM [facility] NURSE [facility] [place]  11/27/2021 10:45 AM [facility] US4 [facility] [place]  11/30/2021 11:30 AM [facility] NURSE [facility]  12/04/2021  9:45 AM [facility] NURSE [facility]  12/04/2021 10:00 AM [facility] US1 [facility] [place]  12/07/2021 11:50 AM [facility] NURSE [facility]  12/11/2021 11:30 AM [place] - [facility] [place] [facility] None  12/11/2021  1:30 PM Florian Buff, MD [facility]  12/14/2021 11:30 AM [facility] NURSE [facility]  12/18/2021  1:30 PM [place] - [facility] [place] [facility] None  12/18/2021  2:30 PM Roma Schanz, CNM [facility]  12/25/2021  1:30 PM [place] - [facility] [place] [facility] None  12/25/2021  2:30 PM Roma Schanz, CNM [facility]    Orders Placed This Encounter  Procedures   Protein / creatinine ratio, urine   POC Urinalysis Dipstick OB   Florian Buff  Attending Physician for the [facility] 11/13/2021 3:40 PM

## 2021-11-14 ENCOUNTER — Encounter: Payer: Self-pay | Admitting: *Deleted

## 2021-11-14 LAB — PROTEIN / CREATININE RATIO, URINE
Creatinine, Urine: 294.8 mg/dL
Protein, Ur: 151.1 mg/dL
Protein/Creat Ratio: 513 mg/g creat — ABNORMAL HIGH (ref 0–200)

## 2021-11-16 ENCOUNTER — Ambulatory Visit (INDEPENDENT_AMBULATORY_CARE_PROVIDER_SITE_OTHER): Payer: 59 | Admitting: *Deleted

## 2021-11-16 VITALS — BP 134/92 | HR 90

## 2021-11-16 DIAGNOSIS — R002 Palpitations: Secondary | ICD-10-CM

## 2021-11-16 DIAGNOSIS — Z3A34 34 weeks gestation of pregnancy: Secondary | ICD-10-CM | POA: Diagnosis not present

## 2021-11-16 DIAGNOSIS — O0990 Supervision of high risk pregnancy, unspecified, unspecified trimester: Secondary | ICD-10-CM

## 2021-11-16 DIAGNOSIS — O4700 False labor before 37 completed weeks of gestation, unspecified trimester: Secondary | ICD-10-CM

## 2021-11-16 DIAGNOSIS — O365930 Maternal care for other known or suspected poor fetal growth, third trimester, not applicable or unspecified: Secondary | ICD-10-CM | POA: Diagnosis not present

## 2021-11-16 DIAGNOSIS — R030 Elevated blood-pressure reading, without diagnosis of hypertension: Secondary | ICD-10-CM

## 2021-11-16 LAB — POCT URINALYSIS DIPSTICK OB
Blood, UA: NEGATIVE
Glucose, UA: NEGATIVE
Nitrite, UA: NEGATIVE

## 2021-11-16 NOTE — Progress Notes (Signed)
   NURSE VISIT- NST  SUBJECTIVE:  Sarah Campbell is a 21 y.o. G2P0010 female at [redacted]w[redacted]d, here for a NST for pregnancy complicated by FGR.  She reports active fetal movement, contractions: none, vaginal bleeding: none, membranes: intact.   OBJECTIVE:  BP (!) 134/92   Pulse 90   LMP 03/21/2021   Appears well, no apparent distress  Results for orders placed or performed in visit on 11/16/21 (from the past 24 hour(s))  POC Urinalysis Dipstick OB   Collection Time: 11/16/21  1:11 PM  Result Value Ref Range   Color, UA     Clarity, UA     Glucose, UA Negative Negative   Bilirubin, UA     Ketones, UA trace    Spec Grav, UA     Blood, UA neg    pH, UA     POC,PROTEIN,UA 4+ Negative, Trace, Small (1+), Moderate (2+), Large (3+), 4+   Urobilinogen, UA     Nitrite, UA neg    Leukocytes, UA     Appearance     Odor      NST: FHR baseline 140 bpm, Variability: moderate, Accelerations:present, Decelerations:  Absent= Cat 1/reactive Toco: UI   ASSESSMENT: G2P0010 at [redacted]w[redacted]d with FGR NST reactive  PLAN: EFM strip reviewed by Dr. Elonda Husky   Recommendations: keep next appointment as scheduled    Sarah Campbell  11/16/2021 1:12 PM

## 2021-11-19 ENCOUNTER — Inpatient Hospital Stay (HOSPITAL_COMMUNITY)
Admission: AD | Admit: 2021-11-19 | Discharge: 2021-11-19 | Disposition: A | Discharge status: Home / Self Care | Payer: 59 | Attending: Obstetrics & Gynecology | Admitting: Obstetrics & Gynecology

## 2021-11-19 ENCOUNTER — Encounter (HOSPITAL_COMMUNITY): Payer: Self-pay | Admitting: Obstetrics & Gynecology

## 2021-11-19 DIAGNOSIS — O1493 Unspecified pre-eclampsia, third trimester: Secondary | ICD-10-CM

## 2021-11-19 DIAGNOSIS — O26893 Other specified pregnancy related conditions, third trimester: Secondary | ICD-10-CM

## 2021-11-19 DIAGNOSIS — R102 Pelvic and perineal pain: Secondary | ICD-10-CM | POA: Diagnosis not present

## 2021-11-19 DIAGNOSIS — Z3A34 34 weeks gestation of pregnancy: Secondary | ICD-10-CM

## 2021-11-19 DIAGNOSIS — O1403 Mild to moderate pre-eclampsia, third trimester: Secondary | ICD-10-CM | POA: Diagnosis not present

## 2021-11-19 DIAGNOSIS — O1490 Unspecified pre-eclampsia, unspecified trimester: Secondary | ICD-10-CM | POA: Diagnosis present

## 2021-11-19 LAB — COMPREHENSIVE METABOLIC PANEL
ALT: 11 U/L (ref 0–44)
AST: 18 U/L (ref 15–41)
Albumin: 2.4 g/dL — ABNORMAL LOW (ref 3.5–5.0)
Alkaline Phosphatase: 179 U/L — ABNORMAL HIGH (ref 38–126)
Anion gap: 10 (ref 5–15)
BUN: 5 mg/dL — ABNORMAL LOW (ref 6–20)
CO2: 19 mmol/L — ABNORMAL LOW (ref 22–32)
Calcium: 8 mg/dL — ABNORMAL LOW (ref 8.9–10.3)
Chloride: 104 mmol/L (ref 98–111)
Creatinine, Ser: 0.83 mg/dL (ref 0.44–1.00)
GFR, Estimated: >60 mL/min (ref >60)
Glucose, Bld: 84 mg/dL (ref 70–99)
Potassium: 4.3 mmol/L (ref 3.5–5.1)
Sodium: 133 mmol/L — ABNORMAL LOW (ref 135–145)
Total Bilirubin: 0.4 mg/dL (ref 0.3–1.2)
Total Protein: 5.7 g/dL — ABNORMAL LOW (ref 6.5–8.1)

## 2021-11-19 LAB — CBC
HCT: 33.5 % — ABNORMAL LOW (ref 36.0–46.0)
Hemoglobin: 10.7 g/dL — ABNORMAL LOW (ref 12.0–15.0)
MCH: 26.8 pg (ref 26.0–34.0)
MCHC: 31.9 g/dL (ref 30.0–36.0)
MCV: 84 fL (ref 80.0–100.0)
Platelets: 249 10*3/uL (ref 150–400)
RBC: 3.99 MIL/uL (ref 3.87–5.11)
RDW: 15.9 % — ABNORMAL HIGH (ref 11.5–15.5)
WBC: 10 10*3/uL (ref 4.0–10.5)
nRBC: 0 % (ref 0.0–0.2)

## 2021-11-19 LAB — URINALYSIS, ROUTINE W REFLEX MICROSCOPIC
Bilirubin Urine: NEGATIVE
Glucose, UA: NEGATIVE mg/dL
Hgb urine dipstick: NEGATIVE
Ketones, ur: NEGATIVE mg/dL
Leukocytes,Ua: NEGATIVE
Nitrite: NEGATIVE
Protein, ur: >=300 mg/dL — AB
Specific Gravity, Urine: 1.015 (ref 1.005–1.030)
pH: 7 (ref 5.0–8.0)

## 2021-11-19 LAB — PROTEIN / CREATININE RATIO, URINE
Creatinine, Urine: 107 mg/dL
Protein Creatinine Ratio: 3.37 mg/mg{Cre} — ABNORMAL HIGH (ref 0.00–0.15)
Total Protein, Urine: 361 mg/dL

## 2021-11-19 MED ORDER — CYCLOBENZAPRINE HCL 5 MG PO TABS
10.0000 mg | ORAL_TABLET | Freq: Once | ORAL | Status: AC
  Administered 2021-11-19: 10 mg via ORAL
  Filled 2021-11-19: qty 2

## 2021-11-19 NOTE — Discharge Instructions (Signed)

## 2021-11-19 NOTE — MAU Provider Note (Addendum)
History     CSN: [id number]  Arrival date and time: 11/19/21 1549   Event Date/Time   First Provider Initiated Contact with Patient 11/19/21 1628      Chief Complaint  Patient presents with   Abdominal Pain   pelvic pressure   HPI  Sarah Campbell is a 21 y.o. G2P0010 at [redacted]w[redacted]d who presents for evaluation of pelvic pain. Patient reports the pain started yesterday and has continued into today. She reports she slept most of yesterday and today. She has not had any water to drink in 2 days. Patient rates the pain as a 9/10 and has tried tylenol yesterday for the pain with no relief. She denies any vaginal bleeding, discharge, and leaking of fluid. Denies any constipation, diarrhea or any urinary complaints. Reports normal fetal movement since her arrival to MAU, reports it was decreased for the last 4 hours.  Patient has a known diagnosis of preeclampsia without severe features. She denies any HA, visual changes or epigastric pain.   OB History     Gravida  2   Para  0   Term  0   Preterm  0   AB  1   Living  0      SAB  1   IAB  0   Ectopic  0   Multiple  0   Live Births  0           Past Medical History:  Diagnosis Date   Heart murmur    Medical history non-contributory     Past Surgical History:  Procedure Laterality Date   NO PAST SURGERIES      Family History  Problem Relation Age of Onset   Alcoholism Father    Diabetes Maternal Grandfather    Heart attack Maternal Grandfather    Stroke Maternal Grandfather    Heart attack Paternal Grandmother    Heart attack Maternal Aunt    Stroke Maternal Aunt     Social History   Tobacco Use   Smoking status: Former    Types: E-cigarettes   Smokeless tobacco: Never  Building services engineer Use: Every day   Substances: Nicotine  Substance Use Topics   Alcohol use: No   Drug use: No    Allergies: No Known Allergies  No medications prior to admission.    Review of Systems  Constitutional:  Negative.  Negative for fatigue and fever.  HENT: Negative.    Respiratory: Negative.  Negative for shortness of breath.   Cardiovascular: Negative.  Negative for chest pain.  Gastrointestinal:  Positive for abdominal pain. Negative for constipation, diarrhea, nausea and vomiting.  Genitourinary:  Positive for pelvic pain. Negative for dysuria, vaginal bleeding and vaginal discharge.  Neurological: Negative.  Negative for dizziness and headaches.   Physical Exam   Blood pressure 138/84, pulse 60, temperature 98.4 F (36.9 C), temperature source Oral, resp. rate 20, height 5\' 6"  (1.676 m), weight 77 kg, last menstrual period 03/21/2021, SpO2 100 %.  Patient Vitals for the past 24 hrs:  BP Temp Temp src Pulse Resp SpO2 Height Weight  11/19/21 1846 138/84 -- -- 60 -- -- -- --  11/19/21 1830 (!) 131/96 -- -- 65 -- 100 % -- --  11/19/21 1816 130/87 -- -- 70 -- -- -- --  11/19/21 1801 (!) 141/92 -- -- 65 -- 99 % -- --  11/19/21 1746 (!) 145/99 -- -- 66 -- -- -- --  11/19/21 1730 (!) 152/102 -- --  64 -- 100 % -- --  11/19/21 1716 (!) 166/104 -- -- (!) 58 -- -- -- --  11/19/21 1701 (!) 153/98 -- -- 60 -- -- -- --  11/19/21 1700 (!) 153/98 -- -- 60 -- 100 % -- --  11/19/21 1646 (!) 140/94 -- -- 69 -- -- -- --  11/19/21 1630 134/85 -- -- 95 -- 99 % -- --  11/19/21 1623 (!) 126/90 -- -- 80 -- -- -- --  11/19/21 1604 (!) 137/94 98.4 F (36.9 C) Oral 94 20 98 % 5\' 6"  (1.676 m) 77 kg    Physical Exam Vitals and nursing note reviewed.  Constitutional:      General: She is not in acute distress.    Appearance: She is well-developed.  HENT:     Head: Normocephalic.  Eyes:     Pupils: Pupils are equal, round, and reactive to light.  Cardiovascular:     Rate and Rhythm: Normal rate and regular rhythm.     Heart sounds: Normal heart sounds.  Pulmonary:     Effort: Pulmonary effort is normal. No respiratory distress.     Breath sounds: Normal breath sounds.  Abdominal:     General: Bowel  sounds are normal. There is no distension.     Palpations: Abdomen is soft.     Tenderness: There is no abdominal tenderness.  Skin:    General: Skin is warm and dry.  Neurological:     Mental Status: She is alert and oriented to person, place, and time.  Psychiatric:        Mood and Affect: Mood normal.        Behavior: Behavior normal.        Thought Content: Thought content normal.        Judgment: Judgment normal.     Fetal Tracing:  Baseline: 130 Variability: moderate Accels: 15x15 Decels: none  Toco: none  Dilation: Closed Exam by:: Len Blalock, CNM   MAU Course  Procedures  Results for orders placed or performed during the hospital encounter of 11/19/21 (from the past 24 hour(s))  Urinalysis, Routine w reflex microscopic Urine, Clean Catch     Status: Abnormal   Collection Time: 11/19/21  4:52 PM  Result Value Ref Range   Color, Urine YELLOW YELLOW   APPearance HAZY (A) CLEAR   Specific Gravity, Urine 1.015 1.005 - 1.030   pH 7.0 5.0 - 8.0   Glucose, UA NEGATIVE NEGATIVE mg/dL   Hgb urine dipstick NEGATIVE NEGATIVE   Bilirubin Urine NEGATIVE NEGATIVE   Ketones, ur NEGATIVE NEGATIVE mg/dL   Protein, ur >=300 (A) NEGATIVE mg/dL   Nitrite NEGATIVE NEGATIVE   Leukocytes,Ua NEGATIVE NEGATIVE   RBC / HPF 0-5 0 - 5 RBC/hpf   WBC, UA 0-5 0 - 5 WBC/hpf   Bacteria, UA RARE (A) NONE SEEN   Squamous Epithelial / LPF 0-5 0 - 5   Mucus PRESENT   Protein / creatinine ratio, urine     Status: Abnormal   Collection Time: 11/19/21  4:52 PM  Result Value Ref Range   Creatinine, Urine 107 mg/dL   Total Protein, Urine 361 mg/dL   Protein Creatinine Ratio 3.37 (H) 0.00 - 0.15 mg/mg[Cre]  CBC     Status: Abnormal   Collection Time: 11/19/21  5:41 PM  Result Value Ref Range   WBC 10.0 4.0 - 10.5 K/uL   RBC 3.99 3.87 - 5.11 MIL/uL   Hemoglobin 10.7 (L) 12.0 - 15.0 g/dL   HCT  33.5 (L) 36.0 - 46.0 %   MCV 84.0 80.0 - 100.0 fL   MCH 26.8 26.0 - 34.0 pg   MCHC 31.9 30.0  - 36.0 g/dL   RDW 92.4 (H) 26.8 - 34.1 %   Platelets 249 150 - 400 K/uL   nRBC 0.0 0.0 - 0.2 %  Comprehensive metabolic panel     Status: Abnormal   Collection Time: 11/19/21  5:41 PM  Result Value Ref Range   Sodium 133 (L) 135 - 145 mmol/L   Potassium 4.3 3.5 - 5.1 mmol/L   Chloride 104 98 - 111 mmol/L   CO2 19 (L) 22 - 32 mmol/L   Glucose, Bld 84 70 - 99 mg/dL   BUN 5 (L) 6 - 20 mg/dL   Creatinine, Ser 9.62 0.44 - 1.00 mg/dL   Calcium 8.0 (L) 8.9 - 10.3 mg/dL   Total Protein 5.7 (L) 6.5 - 8.1 g/dL   Albumin 2.4 (L) 3.5 - 5.0 g/dL   AST 18 15 - 41 U/L   ALT 11 0 - 44 U/L   Alkaline Phosphatase 179 (H) 38 - 126 U/L   Total Bilirubin 0.4 0.3 - 1.2 mg/dL   GFR, Estimated >22 >97 mL/min   Anion gap 10 5 - 15     MDM Labs ordered and reviewed.   UA, UC CBC, CMP, Protein/creat ratio PO hydration Flexeril PO  CNM consulted with Dr. Adrian Blackwater regarding presentation and results with one severe BP- MD recommends discharge home with follow up with MD tomorrow as scheduled  Assessment and Plan   1. Pelvic pain affecting pregnancy in third trimester, antepartum   2. [redacted] weeks gestation of pregnancy   3. Pre-eclampsia in third trimester     -Discharge home in stable condition -Encouraged patient to take previously prescribed flexeril. Discussed importance of PO hydration in pregnancy -Third trimester precautions discussed -Patient advised to follow-up with OB as scheduled for prenatal care tomorrow -Patient may return to MAU as needed or if her condition were to change or worsen  Rolm Bookbinder, CNM 11/19/2021, 4:28 PM

## 2021-11-19 NOTE — MAU Note (Signed)
Sarah Campbell is a 21 y.o. at [redacted]w[redacted]d here in MAU reporting: having a lot of pelvic pain and period like cramps.  Pains are sporadic. Started yesterday.  Was dx with pre-E last Tue.  Was taken out of work, is on bedrest.  Hasn't felt the baby move in 4 hrs. No bleeding, ? Dribbling or leaking, first noted this morning.  Onset of complaint: consistent since yesterday Pain score: 9 Vitals:   11/19/21 1604  BP: (!) 137/94  Pulse: 94  Resp: 20  Temp: 98.4 F (36.9 C)  SpO2: 98%     FHT:140 Lab orders placed from triage:  urine obtained

## 2021-11-20 ENCOUNTER — Ambulatory Visit: Payer: 59 | Attending: Obstetrics & Gynecology

## 2021-11-20 ENCOUNTER — Ambulatory Visit: Payer: 59 | Admitting: *Deleted

## 2021-11-20 ENCOUNTER — Other Ambulatory Visit: Payer: Self-pay | Admitting: Obstetrics & Gynecology

## 2021-11-20 ENCOUNTER — Ambulatory Visit (INDEPENDENT_AMBULATORY_CARE_PROVIDER_SITE_OTHER): Payer: 59 | Admitting: Obstetrics & Gynecology

## 2021-11-20 VITALS — BP 152/107 | HR 88

## 2021-11-20 VITALS — BP 133/96 | HR 77 | Wt 171.0 lb

## 2021-11-20 DIAGNOSIS — O365930 Maternal care for other known or suspected poor fetal growth, third trimester, not applicable or unspecified: Secondary | ICD-10-CM

## 2021-11-20 DIAGNOSIS — Z3A34 34 weeks gestation of pregnancy: Secondary | ICD-10-CM

## 2021-11-20 DIAGNOSIS — O0990 Supervision of high risk pregnancy, unspecified, unspecified trimester: Secondary | ICD-10-CM

## 2021-11-20 DIAGNOSIS — O1413 Severe pre-eclampsia, third trimester: Secondary | ICD-10-CM | POA: Diagnosis not present

## 2021-11-20 DIAGNOSIS — O365990 Maternal care for other known or suspected poor fetal growth, unspecified trimester, not applicable or unspecified: Secondary | ICD-10-CM

## 2021-11-20 DIAGNOSIS — O1493 Unspecified pre-eclampsia, third trimester: Secondary | ICD-10-CM

## 2021-11-20 DIAGNOSIS — R002 Palpitations: Secondary | ICD-10-CM | POA: Diagnosis present

## 2021-11-20 DIAGNOSIS — O4700 False labor before 37 completed weeks of gestation, unspecified trimester: Secondary | ICD-10-CM | POA: Insufficient documentation

## 2021-11-20 LAB — POCT URINALYSIS DIPSTICK OB
Glucose, UA: NEGATIVE
Ketones, UA: NEGATIVE
Leukocytes, UA: NEGATIVE
Nitrite, UA: NEGATIVE

## 2021-11-20 LAB — CULTURE, OB URINE: Culture: NO GROWTH

## 2021-11-20 MED ORDER — LABETALOL HCL 200 MG PO TABS: 200.0000 mg | ORAL_TABLET | Freq: Two times a day (BID) | ORAL | 3 refills | Status: DC

## 2021-11-20 NOTE — Progress Notes (Signed)
HIGH-RISK PREGNANCY VISIT Patient name: Sarah Campbell MRN [id number]  Date of birth: 01-05-2001 Chief Complaint:   Routine Prenatal Visit (Complains of blurred vision)  History of Present Illness:   ASHMITA KIMBROUGH is a 21 y.o. G33P0010 female at [redacted]w[redacted]d with an Estimated Date of Delivery: 12/26/21 being seen today for ongoing management of a high-risk pregnancy complicated by pre eclampsia, mild and FGR AC4% EFW 12% normal UAD.    Today she reports no complaints. Contractions: Irritability. Vag. Bleeding: None.  Movement: Present. denies leaking of fluid.      09/20/2021    9:36 AM 06/18/2021    2:30 PM 05/30/2020   10:43 AM 12/31/2017    1:40 PM 10/15/2017   12:01 PM  Depression screen PHQ 2/9  Decreased Interest 0 0 0 0 0  Down, Depressed, Hopeless 0 0 0 0 0  PHQ - 2 Score 0 0 0 0 0  Altered sleeping 0 2 1    Tired, decreased energy 0 0 1    Change in appetite 0 0 0    Feeling bad or failure about yourself  0 0 0    Trouble concentrating 0 0 0    Moving slowly or fidgety/restless 0 0 0    Suicidal thoughts 0 0 0    PHQ-9 Score 0 2 2          09/20/2021    9:37 AM 06/18/2021    2:30 PM 05/30/2020   10:43 AM  GAD 7 : Generalized Anxiety Score  Nervous, Anxious, on Edge 0 0 0  Control/stop worrying 0 0 0  Worry too much - different things 0 0 0  Trouble relaxing 0 0 1  Restless 0 0 0  Easily annoyed or irritable 0 0 0  Afraid - awful might happen 0 0 0  Total GAD 7 Score 0 0 1     Review of Systems:   Pertinent items are noted in HPI Denies abnormal vaginal discharge w/ itching/odor/irritation, headaches, visual changes, shortness of breath, chest pain, abdominal pain, severe nausea/vomiting, or problems with urination or bowel movements unless otherwise stated above. Pertinent History Reviewed:  Reviewed past medical,surgical, social, obstetrical and family history.  Reviewed problem list, medications and allergies. Physical Assessment:   Vitals:   11/20/21 1459   BP: (!) 133/96  Pulse: 77  Weight: 171 lb (77.6 kg)  Body mass index is 27.6 kg/m.           Physical Examination:   General appearance: alert, well appearing, and in no distress  Mental status: alert, oriented to person, place, and time  Skin: warm & dry   Extremities: Edema: Trace    Cardiovascular: normal heart rate noted  Respiratory: normal respiratory effort, no distress  Abdomen: gravid, soft, non-tender  Pelvic: Cervical exam deferred         Fetal Status:     Movement: Present    Fetal Surveillance Testing today: BPP 8/8 with normal UAD ratios   Chaperone: N/A    Results for orders placed or performed in visit on 11/20/21 (from the past 24 hour(s))  POC Urinalysis Dipstick OB   Collection Time: 11/20/21  3:11 PM  Result Value Ref Range   Color, UA     Clarity, UA     Glucose, UA Negative Negative   Bilirubin, UA     Ketones, UA negative    Spec Grav, UA     Blood, UA trace    pH,  UA     POC,PROTEIN,UA Large (3+) Negative, Trace, Small (1+), Moderate (2+), Large (3+), 4+   Urobilinogen, UA     Nitrite, UA negative    Leukocytes, UA Negative Negative   Appearance     Odor    Results for orders placed or performed during the hospital encounter of 11/19/21 (from the past 24 hour(s))  Urinalysis, Routine w reflex microscopic Urine, Clean Catch   Collection Time: 11/19/21  4:52 PM  Result Value Ref Range   Color, Urine YELLOW YELLOW   APPearance HAZY (A) CLEAR   Specific Gravity, Urine 1.015 1.005 - 1.030   pH 7.0 5.0 - 8.0   Glucose, UA NEGATIVE NEGATIVE mg/dL   Hgb urine dipstick NEGATIVE NEGATIVE   Bilirubin Urine NEGATIVE NEGATIVE   Ketones, ur NEGATIVE NEGATIVE mg/dL   Protein, ur >=300 (A) NEGATIVE mg/dL   Nitrite NEGATIVE NEGATIVE   Leukocytes,Ua NEGATIVE NEGATIVE   RBC / HPF 0-5 0 - 5 RBC/hpf   WBC, UA 0-5 0 - 5 WBC/hpf   Bacteria, UA RARE (A) NONE SEEN   Squamous Epithelial / LPF 0-5 0 - 5   Mucus PRESENT   Protein / creatinine ratio, urine    Collection Time: 11/19/21  4:52 PM  Result Value Ref Range   Creatinine, Urine 107 mg/dL   Total Protein, Urine 361 mg/dL   Protein Creatinine Ratio 3.37 (H) 0.00 - 0.15 mg/mg[Cre]  CBC   Collection Time: 11/19/21  5:41 PM  Result Value Ref Range   WBC 10.0 4.0 - 10.5 K/uL   RBC 3.99 3.87 - 5.11 MIL/uL   Hemoglobin 10.7 (L) 12.0 - 15.0 g/dL   HCT 33.5 (L) 36.0 - 46.0 %   MCV 84.0 80.0 - 100.0 fL   MCH 26.8 26.0 - 34.0 pg   MCHC 31.9 30.0 - 36.0 g/dL   RDW 15.9 (H) 11.5 - 15.5 %   Platelets 249 150 - 400 K/uL   nRBC 0.0 0.0 - 0.2 %  Comprehensive metabolic panel   Collection Time: 11/19/21  5:41 PM  Result Value Ref Range   Sodium 133 (L) 135 - 145 mmol/L   Potassium 4.3 3.5 - 5.1 mmol/L   Chloride 104 98 - 111 mmol/L   CO2 19 (L) 22 - 32 mmol/L   Glucose, Bld 84 70 - 99 mg/dL   BUN 5 (L) 6 - 20 mg/dL   Creatinine, Ser 0.83 0.44 - 1.00 mg/dL   Calcium 8.0 (L) 8.9 - 10.3 mg/dL   Total Protein 5.7 (L) 6.5 - 8.1 g/dL   Albumin 2.4 (L) 3.5 - 5.0 g/dL   AST 18 15 - 41 U/L   ALT 11 0 - 44 U/L   Alkaline Phosphatase 179 (H) 38 - 126 U/L   Total Bilirubin 0.4 0.3 - 1.2 mg/dL   GFR, Estimated >60 >60 mL/min   Anion gap 10 5 - 15    Assessment & Plan:  High-risk pregnancy: G2P0010 at [redacted]w[redacted]d with an Estimated Date of Delivery: 12/26/21      ICD-10-CM   1. Supervision of high risk pregnancy, antepartum  O09.90 POC Urinalysis Dipstick OB    2. Pre-eclampsia in third trimester  O14.93 POC Urinalysis Dipstick OB   CMP/Platelets normal   Pr/Cr 3.34  FGR AC<4%   Normal UAD  I am starting labetalol 200 BID today     3. Fetal growth restriction antepartum  O36.5990    EFW 12%/AC 4% normal UAD    4. [redacted] weeks  gestation of pregnancy  Z3A.34 POC Urinalysis Dipstick OB      Meds:  Meds ordered this encounter  Medications   labetalol (NORMODYNE) 200 MG tablet    Sig: Take 1 tablet (200 mg total) by mouth 2 (two) times daily.    Dispense:  60 tablet    Refill:  3    Orders:   Orders Placed This Encounter  Procedures   POC Urinalysis Dipstick OB     Labs/procedures today: U/S  Treatment Plan:  twice weekly surveillance , weekly labs, IOL 37 weeks or sooner if indicated  Reviewed: Preterm labor symptoms and general obstetric precautions including but not limited to vaginal bleeding, contractions, leaking of fluid and fetal movement were reviewed in detail with the patient.  All questions were answered. Does have home bp cuff. Office bp cuff given: not applicable. Check bp twice daily, let us know if consistently >150 and/or >95.  Follow-up: No follow-ups on file.   Future Appointments  Date Time Provider [place]  11/23/2021 11:30 AM [facility] NURSE [facility]  11/27/2021 10:30 AM [facility] NURSE [facility]  11/27/2021 10:45 AM [facility] US4 [facility] [place]  11/27/2021  2:10 PM Florian Buff, MD [facility]  11/30/2021 11:30 AM [facility] NURSE [facility]  12/04/2021  9:45 AM [facility] NURSE [facility] [place]  12/04/2021 10:00 AM [facility] US1 [facility] [place]  12/04/2021  1:50 PM Florian Buff, MD [facility]  12/07/2021 11:50 AM [facility] NURSE [facility]  12/11/2021 11:30 AM [place] - [facility] [place] [facility] None  12/11/2021  1:30 PM Florian Buff, MD [facility]  12/14/2021 11:30 AM [facility] NURSE [facility]  12/18/2021  1:30 PM [place] - [facility] [place] [facility] None  12/18/2021  2:30 PM Roma Schanz, CNM [facility]  12/25/2021  1:30 PM [place] - [facility] [place] [facility] None  12/25/2021  2:30 PM Roma Schanz, CNM [facility]    Orders Placed This Encounter  Procedures   POC Urinalysis Dipstick OB   Florian Buff  Attending Physician for the [facility] 11/20/2021 3:41 PM

## 2021-11-23 ENCOUNTER — Ambulatory Visit (INDEPENDENT_AMBULATORY_CARE_PROVIDER_SITE_OTHER): Payer: 59 | Admitting: *Deleted

## 2021-11-23 VITALS — BP 136/91 | HR 88

## 2021-11-23 DIAGNOSIS — R002 Palpitations: Secondary | ICD-10-CM

## 2021-11-23 DIAGNOSIS — O0990 Supervision of high risk pregnancy, unspecified, unspecified trimester: Secondary | ICD-10-CM | POA: Diagnosis not present

## 2021-11-23 DIAGNOSIS — O1493 Unspecified pre-eclampsia, third trimester: Secondary | ICD-10-CM

## 2021-11-23 DIAGNOSIS — O4700 False labor before 37 completed weeks of gestation, unspecified trimester: Secondary | ICD-10-CM

## 2021-11-23 DIAGNOSIS — Z3A35 35 weeks gestation of pregnancy: Secondary | ICD-10-CM

## 2021-11-23 DIAGNOSIS — O365990 Maternal care for other known or suspected poor fetal growth, unspecified trimester, not applicable or unspecified: Secondary | ICD-10-CM | POA: Diagnosis not present

## 2021-11-23 NOTE — Progress Notes (Signed)
   NURSE VISIT- NST  SUBJECTIVE:  Sarah Campbell is a 21 y.o. G2P0010 female at 108w2d, here for a NST for pregnancy complicated by FGR and Pre-eclampsia.  She reports active fetal movement, contractions: none, vaginal bleeding: none, membranes: intact.   OBJECTIVE:  BP (!) 136/91   Pulse 88   LMP 03/21/2021   Appears well, no apparent distress  No results found for this or any previous visit (from the past 24 hour(s)).  NST: FHR baseline 145 bpm, Variability: moderate, Accelerations:present, Decelerations:  Absent= Cat 1/reactive Toco: UI    ASSESSMENT: G2P0010 at [redacted]w[redacted]d with FGR and Pre-eclampsia NST reactive  PLAN: EFM strip reviewed by Dr. Nelda Marseille   Recommendations: keep next appointment as scheduled    Alice Rieger  11/23/2021 12:43 PM

## 2021-11-27 ENCOUNTER — Ambulatory Visit (INDEPENDENT_AMBULATORY_CARE_PROVIDER_SITE_OTHER): Payer: Medicaid Other | Admitting: Obstetrics & Gynecology

## 2021-11-27 ENCOUNTER — Encounter: Payer: Self-pay | Admitting: Obstetrics & Gynecology

## 2021-11-27 ENCOUNTER — Other Ambulatory Visit: Payer: Self-pay | Admitting: Women's Health

## 2021-11-27 ENCOUNTER — Other Ambulatory Visit (HOSPITAL_COMMUNITY)
Admission: RE | Admit: 2021-11-27 | Discharge: 2021-11-27 | Disposition: A | Discharge status: Home / Self Care | Payer: 59 | Source: Ambulatory Visit | Attending: Obstetrics & Gynecology | Admitting: Obstetrics & Gynecology

## 2021-11-27 ENCOUNTER — Ambulatory Visit: Payer: 59 | Admitting: *Deleted

## 2021-11-27 ENCOUNTER — Ambulatory Visit (HOSPITAL_BASED_OUTPATIENT_CLINIC_OR_DEPARTMENT_OTHER): Payer: 59

## 2021-11-27 VITALS — BP 138/98 | HR 83

## 2021-11-27 VITALS — BP 135/91 | HR 80 | Wt 172.0 lb

## 2021-11-27 DIAGNOSIS — O1403 Mild to moderate pre-eclampsia, third trimester: Secondary | ICD-10-CM | POA: Insufficient documentation

## 2021-11-27 DIAGNOSIS — Z363 Encounter for antenatal screening for malformations: Secondary | ICD-10-CM | POA: Insufficient documentation

## 2021-11-27 DIAGNOSIS — R002 Palpitations: Secondary | ICD-10-CM

## 2021-11-27 DIAGNOSIS — O0990 Supervision of high risk pregnancy, unspecified, unspecified trimester: Secondary | ICD-10-CM | POA: Insufficient documentation

## 2021-11-27 DIAGNOSIS — O26893 Other specified pregnancy related conditions, third trimester: Secondary | ICD-10-CM | POA: Insufficient documentation

## 2021-11-27 DIAGNOSIS — O365990 Maternal care for other known or suspected poor fetal growth, unspecified trimester, not applicable or unspecified: Secondary | ICD-10-CM

## 2021-11-27 DIAGNOSIS — R519 Headache, unspecified: Secondary | ICD-10-CM | POA: Diagnosis not present

## 2021-11-27 DIAGNOSIS — O4700 False labor before 37 completed weeks of gestation, unspecified trimester: Secondary | ICD-10-CM | POA: Insufficient documentation

## 2021-11-27 DIAGNOSIS — Z3A35 35 weeks gestation of pregnancy: Secondary | ICD-10-CM

## 2021-11-27 DIAGNOSIS — O1493 Unspecified pre-eclampsia, third trimester: Secondary | ICD-10-CM

## 2021-11-27 DIAGNOSIS — O0993 Supervision of high risk pregnancy, unspecified, third trimester: Secondary | ICD-10-CM

## 2021-11-27 LAB — POCT URINALYSIS DIPSTICK OB
Blood, UA: NEGATIVE
Glucose, UA: NEGATIVE
Ketones, UA: NEGATIVE
Leukocytes, UA: NEGATIVE
Nitrite, UA: NEGATIVE

## 2021-11-27 NOTE — Treatment Plan (Signed)
   Induction Assessment Scheduling Form: Fax to [facility]:  [phone]  Sarah Campbell                                                                                   DOB:  March 03, 2000                                                            MRN:  [id number]                                                                     Phone #:   [phone]                         Provider:  [facility]  GP:  [id number]                                                            Estimated Date of Delivery: 12/26/21  Dating Criteria: early sonogram    Medical Indications for induction:  Pre eclampsia, without severe features, FGR(AC 4%, normal UAD) Admission Date/Time:  12/04/21@2345  Gestational age on admission:  [redacted]w[redacted]d   Filed Weights   11/27/21 1408  Weight: 172 lb (78 kg)   HIV:  Non Reactive (08/24 0841) [id number]    LTC   Method of induction(proposed):  cytotec is ok, normal UAD   Scheduling Provider Signature:  Florian Buff, MD                                            Today's Date:  11/27/2021

## 2021-11-27 NOTE — Progress Notes (Signed)
[place] PREGNANCY VISIT Patient name: Sarah Campbell MRN [id number]  Date of birth: 06-Mar-2000 Chief Complaint:   Routine Prenatal Visit  History of Present Illness:   Sarah Campbell is a 21 y.o. G57P0010 female at 47w6dwith an Estimated Date of Delivery: 12/26/21 being seen today for ongoing management of a high-risk pregnancy complicated by mild pre eclampsia and FGR, AC 4%, scheduled for IOL 12/04/21_0 .    Today she reports no complaints. Contractions: Not present. Vag. Bleeding: None.  Movement: Present. denies leaking of fluid.      09/20/2021    9:36 AM 06/18/2021    2:30 PM 05/30/2020   10:43 AM 12/31/2017    1:40 PM 10/15/2017   12:01 PM  Depression screen PHQ 2/9  Decreased Interest 0 0 0 0 0  Down, Depressed, Hopeless 0 0 0 0 0  PHQ - 2 Score 0 0 0 0 0  Altered sleeping 0 2 1    Tired, decreased energy 0 0 1    Change in appetite 0 0 0    Feeling bad or failure about yourself  0 0 0    Trouble concentrating 0 0 0    Moving slowly or fidgety/restless 0 0 0    Suicidal thoughts 0 0 0    PHQ-9 Score 0 2 2          09/20/2021    9:37 AM 06/18/2021    2:30 PM 05/30/2020   10:43 AM  GAD 7 : Generalized Anxiety Score  Nervous, Anxious, on Edge 0 0 0  Control/stop worrying 0 0 0  Worry too much - different things 0 0 0  Trouble relaxing 0 0 1  Restless 0 0 0  Easily annoyed or irritable 0 0 0  Afraid - awful might happen 0 0 0  Total GAD 7 Score 0 0 1     Review of Systems:   Pertinent items are noted in HPI Denies abnormal vaginal discharge w/ itching/odor/irritation, headaches, visual changes, shortness of breath, chest pain, abdominal pain, severe nausea/vomiting, or problems with urination or bowel movements unless otherwise stated above. Pertinent History Reviewed:  Reviewed past medical,surgical, social, obstetrical and family history.  Reviewed problem list, medications and allergies. Physical Assessment:   Vitals:   11/27/21 1408  BP: (!) 135/91   Pulse: 80  Weight: 172 lb (78 kg)  Body mass index is 27.76 kg/m.           Physical Examination:   General appearance: alert, well appearing, and in no distress  Mental status: alert, oriented to person, place, and time  Skin: warm & dry   Extremities: Edema: Trace    Cardiovascular: normal heart rate noted  Respiratory: normal respiratory effort, no distress  Abdomen: gravid, soft, non-tender  Pelvic: Cervical exam performed  Dilation: Closed Effacement (%): Thick Station: -2  Fetal Status: Fetal Heart Rate (bpm): 144 Fundal Height: 36 cm Movement: Present Presentation: Vertex  Fetal Surveillance Testing today: BPP 8/8 with normal UAD   Chaperone: ACelene Squibb   Results for orders placed or performed in visit on 11/27/21 (from the past 24 hour(s))  POC Urinalysis Dipstick OB   Collection Time: 11/27/21  2:14 PM  Result Value Ref Range   Color, UA     Clarity, UA     Glucose, UA Negative Negative   Bilirubin, UA     Ketones, UA neg    Spec Grav, UA     Blood, UA neg  pH, UA     POC,PROTEIN,UA Large (3+) Negative, Trace, Small (1+), Moderate (2+), Large (3+), 4+   Urobilinogen, UA     Nitrite, UA neg    Leukocytes, UA Negative Negative   Appearance     Odor      Assessment & Plan:  High-risk pregnancy: G2P0010 at 31w6dwith an Estimated Date of Delivery: 12/26/21      ICD-10-CM   1. Supervision of high risk pregnancy in third trimester  O09.93 Culture, beta strep (group b only)    Cervicovaginal ancillary only( [place])    POC Urinalysis Dipstick OB    2. Fetal growth restriction antepartum  O36.5990    AC 4% normal UAD, overall EFW 12%    3. Pre-eclampsia in third trimester, without severe features  O14.93 Comp Met (CMET)    CBC   BP stable on labetalol 200 mg BID, no change in her headache symptoms, episodic for the last month, check labs today    4. [redacted] weeks gestation of pregnancy  Z3A.35 Culture, beta strep (group b only)    Cervicovaginal  ancillary only( [place])    POC Urinalysis Dipstick OB        Meds: No orders of the defined types were placed in this encounter.   Orders:  Orders Placed This Encounter  Procedures   Culture, beta strep (group b only)   Comp Met (CMET)   CBC   POC Urinalysis Dipstick OB     Labs/procedures today: U/S  Treatment Plan:  IOL 37 weeks, scheduled  Reviewed: Preterm labor symptoms and general obstetric precautions including but not limited to vaginal bleeding, contractions, leaking of fluid and fetal movement were reviewed in detail with the patient.  All questions were answered. Does have home bp cuff. Office bp cuff given: not applicable. Check bp twice daily, let uKore[place]now if consistently >150 and/or >95.  Follow-up: Return for keep scheduled.   Future Appointments  Date Time Provider [place] 11/30/2021 11:30 AM [facility] NURSE [facility]  12/04/2021  1:50 PM EFlorian Buff MD [facility]  12/05/2021 12:00 AM [facility]-LD SCHED ROOM [facility]-INDC None  12/07/2021 11:50 AM [facility] NURSE [facility]  01/08/2022 11:35 AM [facility] ECHO 5 [facility]    Orders Placed This Encounter  Procedures   Culture, beta strep (group b only)   Comp Met (CMET)   CBC   POC Urinalysis Dipstick OB   LFlorian Buff Attending Physician [facility] 11/27/2021 2:31 PM

## 2021-11-28 ENCOUNTER — Telehealth (HOSPITAL_COMMUNITY): Payer: Self-pay | Admitting: *Deleted

## 2021-11-28 ENCOUNTER — Encounter (HOSPITAL_COMMUNITY): Payer: Self-pay | Admitting: *Deleted

## 2021-11-28 ENCOUNTER — Encounter (HOSPITAL_COMMUNITY): Payer: Self-pay | Admitting: Obstetrics and Gynecology

## 2021-11-28 ENCOUNTER — Inpatient Hospital Stay (HOSPITAL_COMMUNITY)
Admission: AD | Admit: 2021-11-28 | Discharge: 2021-12-02 | DRG: 806 | Disposition: A | Discharge status: Home / Self Care | Payer: 59 | Attending: Obstetrics and Gynecology | Admitting: Obstetrics and Gynecology

## 2021-11-28 ENCOUNTER — Other Ambulatory Visit: Payer: Self-pay

## 2021-11-28 DIAGNOSIS — F17290 Nicotine dependence, other tobacco product, uncomplicated: Secondary | ICD-10-CM | POA: Diagnosis present

## 2021-11-28 DIAGNOSIS — I3139 Other pericardial effusion (noninflammatory): Secondary | ICD-10-CM | POA: Diagnosis present

## 2021-11-28 DIAGNOSIS — O1410 Severe pre-eclampsia, unspecified trimester: Secondary | ICD-10-CM | POA: Diagnosis present

## 2021-11-28 DIAGNOSIS — O1414 Severe pre-eclampsia complicating childbirth: Secondary | ICD-10-CM | POA: Diagnosis present

## 2021-11-28 DIAGNOSIS — O365931 Maternal care for other known or suspected poor fetal growth, third trimester, fetus 1: Secondary | ICD-10-CM | POA: Diagnosis not present

## 2021-11-28 DIAGNOSIS — O99892 Other specified diseases and conditions complicating childbirth: Secondary | ICD-10-CM | POA: Diagnosis present

## 2021-11-28 DIAGNOSIS — O1413 Severe pre-eclampsia, third trimester: Secondary | ICD-10-CM | POA: Diagnosis not present

## 2021-11-28 DIAGNOSIS — O99334 Smoking (tobacco) complicating childbirth: Secondary | ICD-10-CM | POA: Diagnosis present

## 2021-11-28 DIAGNOSIS — Z3A36 36 weeks gestation of pregnancy: Secondary | ICD-10-CM | POA: Diagnosis not present

## 2021-11-28 DIAGNOSIS — O4202 Full-term premature rupture of membranes, onset of labor within 24 hours of rupture: Secondary | ICD-10-CM | POA: Diagnosis not present

## 2021-11-28 DIAGNOSIS — O365930 Maternal care for other known or suspected poor fetal growth, third trimester, not applicable or unspecified: Secondary | ICD-10-CM | POA: Diagnosis present

## 2021-11-28 DIAGNOSIS — Z8759 Personal history of other complications of pregnancy, childbirth and the puerperium: Secondary | ICD-10-CM | POA: Diagnosis present

## 2021-11-28 HISTORY — DX: Unspecified pre-eclampsia, unspecified trimester: O14.90

## 2021-11-28 LAB — COMPREHENSIVE METABOLIC PANEL
ALT: 8 IU/L (ref 0–32)
ALT: 10 U/L (ref 0–44)
AST: 18 IU/L (ref 0–40)
AST: 20 U/L (ref 15–41)
Albumin/Globulin Ratio: 1.3 (ref 1.2–2.2)
Albumin: 3.2 g/dL — ABNORMAL LOW (ref 4.0–5.0)
Albumin: 2.3 g/dL — ABNORMAL LOW (ref 3.5–5.0)
Alkaline Phosphatase: 220 IU/L — ABNORMAL HIGH (ref 44–121)
Alkaline Phosphatase: 183 U/L — ABNORMAL HIGH (ref 38–126)
Anion gap: 12 (ref 5–15)
BUN/Creatinine Ratio: 12 (ref 9–23)
BUN: 12 mg/dL (ref 6–20)
BUN: 13 mg/dL (ref 6–20)
Bilirubin Total: <0.2 mg/dL (ref 0.0–1.2)
CO2: 17 mmol/L — ABNORMAL LOW (ref 20–29)
CO2: 20 mmol/L — ABNORMAL LOW (ref 22–32)
Calcium: 8.6 mg/dL — ABNORMAL LOW (ref 8.7–10.2)
Calcium: 8.3 mg/dL — ABNORMAL LOW (ref 8.9–10.3)
Chloride: 103 mmol/L (ref 96–106)
Chloride: 107 mmol/L (ref 98–111)
Creatinine, Ser: 0.99 mg/dL (ref 0.57–1.00)
Creatinine, Ser: 1.01 mg/dL — ABNORMAL HIGH (ref 0.44–1.00)
GFR, Estimated: >60 mL/min (ref >60)
Globulin, Total: 2.4 g/dL (ref 1.5–4.5)
Glucose, Bld: 81 mg/dL (ref 70–99)
Glucose: 71 mg/dL (ref 70–99)
Potassium: 4.5 mmol/L (ref 3.5–5.2)
Potassium: 3.9 mmol/L (ref 3.5–5.1)
Sodium: 136 mmol/L (ref 134–144)
Sodium: 139 mmol/L (ref 135–145)
Total Bilirubin: <0.1 mg/dL — ABNORMAL LOW (ref 0.3–1.2)
Total Protein: 5.6 g/dL — ABNORMAL LOW (ref 6.0–8.5)
Total Protein: 5.7 g/dL — ABNORMAL LOW (ref 6.5–8.1)
eGFR: 83 mL/min/{1.73_m2} (ref >59)

## 2021-11-28 LAB — CBC
HCT: 36.2 % (ref 36.0–46.0)
Hematocrit: 35.2 % (ref 34.0–46.6)
Hemoglobin: 11.6 g/dL (ref 11.1–15.9)
Hemoglobin: 12 g/dL (ref 12.0–15.0)
MCH: 26.6 pg (ref 26.6–33.0)
MCH: 27.4 pg (ref 26.0–34.0)
MCHC: 33 g/dL (ref 31.5–35.7)
MCHC: 33.1 g/dL (ref 30.0–36.0)
MCV: 81 fL (ref 79–97)
MCV: 82.6 fL (ref 80.0–100.0)
Platelets: 287 10*3/uL (ref 150–450)
Platelets: 306 10*3/uL (ref 150–400)
RBC: 4.36 x10E6/uL (ref 3.77–5.28)
RBC: 4.38 MIL/uL (ref 3.87–5.11)
RDW: 15.9 % — ABNORMAL HIGH (ref 11.7–15.4)
RDW: 16.2 % — ABNORMAL HIGH (ref 11.5–15.5)
WBC: 9 10*3/uL (ref 3.4–10.8)
WBC: 10.4 10*3/uL (ref 4.0–10.5)
nRBC: 0.2 % (ref 0.0–0.2)

## 2021-11-28 LAB — TYPE AND SCREEN
ABO/RH(D): O POS
Antibody Screen: NEGATIVE

## 2021-11-28 LAB — URINALYSIS, ROUTINE W REFLEX MICROSCOPIC
Bilirubin Urine: NEGATIVE
Glucose, UA: NEGATIVE mg/dL
Hgb urine dipstick: NEGATIVE
Ketones, ur: NEGATIVE mg/dL
Leukocytes,Ua: NEGATIVE
Nitrite: NEGATIVE
Protein, ur: >=300 mg/dL — AB
Specific Gravity, Urine: 1.028 (ref 1.005–1.030)
pH: 5 (ref 5.0–8.0)

## 2021-11-28 LAB — GROUP B STREP BY PCR: Group B strep by PCR: NEGATIVE

## 2021-11-28 MED ORDER — MAGNESIUM SULFATE 40 GM/1000ML IV SOLN
1.0000 g/h | INTRAVENOUS | Status: AC
  Administered 2021-11-28 – 2021-11-30 (×3): 2 g/h via INTRAVENOUS
  Filled 2021-11-28 (×3): qty 1000

## 2021-11-28 MED ORDER — LABETALOL HCL 5 MG/ML IV SOLN
20.0000 mg | INTRAVENOUS | Status: DC | PRN
  Administered 2021-11-28 – 2021-12-01 (×2): 20 mg via INTRAVENOUS
  Filled 2021-11-28 (×2): qty 4

## 2021-11-28 MED ORDER — MISOPROSTOL 25 MCG QUARTER TABLET
25.0000 ug | ORAL_TABLET | Freq: Once | ORAL | Status: AC
  Administered 2021-11-28: 25 ug via VAGINAL
  Filled 2021-11-28: qty 1

## 2021-11-28 MED ORDER — HYDRALAZINE HCL 20 MG/ML IJ SOLN: 10.0000 mg | INTRAMUSCULAR | Status: DC | PRN

## 2021-11-28 MED ORDER — LIDOCAINE HCL (PF) 1 % IJ SOLN: 30.0000 mL | INTRAMUSCULAR | Status: DC | PRN

## 2021-11-28 MED ORDER — OXYTOCIN BOLUS FROM INFUSION
333.0000 mL | Freq: Once | INTRAVENOUS | Status: AC
  Administered 2021-11-30: 333 mL via INTRAVENOUS

## 2021-11-28 MED ORDER — CYCLOBENZAPRINE HCL 10 MG PO TABS
5.0000 mg | ORAL_TABLET | Freq: Four times a day (QID) | ORAL | Status: DC | PRN
  Administered 2021-11-29: 5 mg via ORAL
  Filled 2021-11-28: qty 1

## 2021-11-28 MED ORDER — ONDANSETRON HCL 4 MG/2ML IJ SOLN
4.0000 mg | Freq: Four times a day (QID) | INTRAMUSCULAR | Status: DC | PRN
  Administered 2021-11-29 (×2): 4 mg via INTRAVENOUS
  Filled 2021-11-28 (×2): qty 2

## 2021-11-28 MED ORDER — OXYTOCIN-SODIUM CHLORIDE 30-0.9 UT/500ML-% IV SOLN
2.5000 [IU]/h | INTRAVENOUS | Status: DC
  Administered 2021-11-30: 2.5 [IU]/h via INTRAVENOUS

## 2021-11-28 MED ORDER — SOD CITRATE-CITRIC ACID 500-334 MG/5ML PO SOLN
30.0000 mL | ORAL | Status: DC | PRN
  Administered 2021-11-29: 30 mL via ORAL
  Filled 2021-11-28: qty 30

## 2021-11-28 MED ORDER — ACETAMINOPHEN 325 MG PO TABS
650.0000 mg | ORAL_TABLET | ORAL | Status: DC | PRN
  Administered 2021-11-28 – 2021-11-30 (×2): 650 mg via ORAL
  Filled 2021-11-28 (×2): qty 2

## 2021-11-28 MED ORDER — OXYCODONE-ACETAMINOPHEN 5-325 MG PO TABS: 1.0000 | ORAL_TABLET | ORAL | Status: DC | PRN

## 2021-11-28 MED ORDER — LABETALOL HCL 5 MG/ML IV SOLN: 80.0000 mg | INTRAVENOUS | Status: DC | PRN

## 2021-11-28 MED ORDER — TERBUTALINE SULFATE 1 MG/ML IJ SOLN: 0.2500 mg | Freq: Once | INTRAMUSCULAR | Status: DC | PRN

## 2021-11-28 MED ORDER — LACTATED RINGERS IV SOLN: INTRAVENOUS | Status: DC

## 2021-11-28 MED ORDER — MISOPROSTOL 25 MCG QUARTER TABLET
25.0000 ug | ORAL_TABLET | ORAL | Status: DC
  Administered 2021-11-29 (×2): 25 ug via ORAL
  Filled 2021-11-28 (×2): qty 1

## 2021-11-28 MED ORDER — LABETALOL HCL 5 MG/ML IV SOLN
40.0000 mg | INTRAVENOUS | Status: DC | PRN
  Administered 2021-12-01: 40 mg via INTRAVENOUS
  Filled 2021-11-28: qty 8

## 2021-11-28 MED ORDER — MAGNESIUM SULFATE BOLUS VIA INFUSION
4.0000 g | Freq: Once | INTRAVENOUS | Status: AC
  Administered 2021-11-28: 4 g via INTRAVENOUS
  Filled 2021-11-28: qty 1000

## 2021-11-28 MED ORDER — LACTATED RINGERS IV SOLN: 500.0000 mL | INTRAVENOUS | Status: DC | PRN

## 2021-11-28 MED ORDER — FLEET ENEMA 7-19 GM/118ML RE ENEM: 1.0000 | ENEMA | RECTAL | Status: DC | PRN

## 2021-11-28 MED ORDER — OXYCODONE-ACETAMINOPHEN 5-325 MG PO TABS: 2.0000 | ORAL_TABLET | ORAL | Status: DC | PRN

## 2021-11-28 MED ORDER — BETAMETHASONE SOD PHOS & ACET 6 (3-3) MG/ML IJ SUSP
12.0000 mg | INTRAMUSCULAR | Status: AC
  Administered 2021-11-28 – 2021-11-29 (×2): 12 mg via INTRAMUSCULAR
  Filled 2021-11-28: qty 5

## 2021-11-28 MED ORDER — MISOPROSTOL 25 MCG QUARTER TABLET
25.0000 ug | ORAL_TABLET | Freq: Once | ORAL | Status: AC
  Administered 2021-11-28: 25 ug via ORAL
  Filled 2021-11-28: qty 1

## 2021-11-28 NOTE — Telephone Encounter (Signed)
Preadmission screen  

## 2021-11-28 NOTE — H&P (Addendum)
OBSTETRIC ADMISSION HISTORY AND PHYSICAL  Sarah Campbell is a 21 y.o. female G2P0010 with IUP at [redacted]w[redacted]d by LMP presenting for IOL 2/2 preE w/ SF. She reports +FMs, No LOF, no VB. Not feeling any contractions. She endorses blurry vision, headaches- unchanged from earlier today. No significant peripheral edema or RUQ pain.  She plans on breast feeding. She request paraguard IUD for birth control. She received her prenatal care at  [facility]    Dating: By LMP --->  Estimated Date of Delivery: 12/26/21  Sono:    @[redacted]w[redacted]d , CWD, normal anatomy, cephalic presentation, 7829F, 12% EFW   Prenatal History/Complications:  -Hx of pyelo in pregnancy -FGR -Palpitations, small pericardial effusion noted on TTE 10/3 (no other abnormalities) -Vaping during pregnancy    Past Medical History: Past Medical History:  Diagnosis Date   Heart murmur    Medical history non-contributory    Pregnancy induced hypertension    Past Surgical History: Past Surgical History:  Procedure Laterality Date   NO PAST SURGERIES     Obstetrical History: OB History     Gravida  2   Para  0   Term  0   Preterm  0   AB  1   Living  0      SAB  1   IAB  0   Ectopic  0   Multiple  0   Live Births  0           Social History Social History   Socioeconomic History   Marital status: Single    Spouse name: Not on file   Number of children: Not on file   Years of education: Not on file   Highest education level: Not on file  Occupational History   Not on file  Tobacco Use   Smoking status: Every Day    Types: E-cigarettes   Smokeless tobacco: Never  Vaping Use   Vaping Use: Every day   Substances: Nicotine, Flavoring  Substance and Sexual Activity   Alcohol use: No   Drug use: No   Sexual activity: Not Currently    Birth control/protection: None  Other Topics Concern   Not on file  Social History Narrative   ** Merged History Encounter **       Social Determinants of Health    Financial Resource Strain: Low Risk  (09/20/2021)   Overall Financial Resource Strain (CARDIA)    Difficulty of Paying Living Expenses: Not hard at all  Food Insecurity: No Food Insecurity (09/20/2021)   Hunger Vital Sign    Worried About Running Out of Food in the Last Year: Never true    Ran Out of Food in the Last Year: Never true  Transportation Needs: No Transportation Needs (09/20/2021)   PRAPARE - Hydrologist (Medical): No    Lack of Transportation (Non-Medical): No  Physical Activity: Sufficiently Active (09/20/2021)   Exercise Vital Sign    Days of Exercise per Week: 7 days    Minutes of Exercise per Session: 30 min  Stress: No Stress Concern Present (09/20/2021)   [place]    Feeling of Stress : Not at all  Social Connections: Moderately Isolated (09/20/2021)   Social Connection and Isolation Panel [NHANES]    Frequency of Communication with Friends and Family: More than three times a week    Frequency of Social Gatherings with Friends and Family: Twice a week    Attends  Religious Services: 1 to 4 times per year    Active Member of Clubs or Organizations: No    Attends Banker Meetings: Never    Marital Status: Never married    Family History: Family History  Problem Relation Age of Onset   Alcoholism Father    Diabetes Maternal Grandfather    Heart attack Maternal Grandfather    Stroke Maternal Grandfather    Heart attack Paternal Grandmother    Heart attack Maternal Aunt    Stroke Maternal Aunt     Allergies: No Known Allergies  Medications Prior to Admission  Medication Sig Dispense Refill Last Dose   acetaminophen (TYLENOL) 500 MG tablet Take 2 tablets (1,000 mg total) by mouth every 12 (twelve) hours as needed for fever or mild pain (for pain scale < 4  OR  temperature  >/=  100.5 F). 30 tablet 0 11/28/2021 at 1500   cyclobenzaprine (FLEXERIL) 5 MG  tablet Take 1 tablet (5 mg total) by mouth 3 (three) times daily as needed for muscle spasms. 30 tablet 0 11/27/2021   docusate sodium (COLACE) 100 MG capsule Take 1 capsule (100 mg total) by mouth 2 (two) times daily as needed for mild constipation. 30 capsule 1 11/27/2021   ferrous sulfate 325 (65 FE) MG tablet Take 1 tablet (325 mg total) by mouth every other day. 45 tablet 2 Past Month   labetalol (NORMODYNE) 200 MG tablet Take 1 tablet (200 mg total) by mouth 2 (two) times daily. 60 tablet 3 11/28/2021 at 1030   omeprazole (PRILOSEC OTC) 20 MG tablet Take 1 tablet (20 mg total) by mouth daily. 90 tablet 3 11/28/2021   ondansetron (ZOFRAN-ODT) 4 MG disintegrating tablet Take 1 tablet (4 mg total) by mouth every 8 (eight) hours as needed for nausea or vomiting. 30 tablet 0 11/28/2021   prenatal vitamin w/FE, FA (PRENATAL 1 + 1) 27-1 MG TABS tablet Take 1 tablet by mouth daily at 12 noon. 30 tablet 12 11/28/2021   NIFEdipine (PROCARDIA) 10 MG capsule Take 1 capsule (10 mg total) by mouth every 6 (six) hours as needed (contractions). 30 capsule 1 More than a month     Review of Systems   All systems reviewed and negative except as stated in HPI  Blood pressure (!) 163/112, pulse 87, temperature 98 F (36.7 C), temperature source Oral, resp. rate 18, height 5\' 6"  (1.676 m), weight 77.9 kg, last menstrual period 03/21/2021, SpO2 96 %. General appearance: alert and no distress Lungs: clear to auscultation bilaterally Heart: regular rate and rhythm, no murmurs/rubs/gallops, normal S1/S2 Abdomen: soft, non-tender; bowel sounds normal Pelvic: normal external genitalia. Cervix: closed/thick/-3.  Extremities: Homans sign is negative, no sign of DVT Presentation: cephalic- by bedside 03/23/2021, see MAU documentation.  Fetal monitoringBaseline: 125 bpm, Variability: Good {> 6 bpm), Accelerations: Reactive, and Decelerations: Absent Uterine activity: intermittent, irregular, non painful     Prenatal  labs: ABO, Rh: --/--/O POS (07/20 1704) Antibody: Negative (08/24 0841) Rubella: 1.46 (05/22 1458) RPR: Non Reactive (08/24 0841)  HBsAg: Negative (05/22 1458)  HIV: Non Reactive (08/24 0841)  GBS:    1 hr Glucola 151 Genetic screening  neg, LR female Anatomy 01-12-1990 wnl  Prenatal Transfer Tool  Maternal Diabetes: No Genetic Screening: Normal Maternal Ultrasounds/Referrals: IUGR Fetal Ultrasounds or other Referrals:  None Maternal Substance Abuse:  Yes:  Type: Smoker, Other: Vaping Significant Maternal Medications:  None Significant Maternal Lab Results:  Other: GBS unknown Number of Prenatal Visits:greater than 3 verified  prenatal visits Other Comments:  None  No results found for this or any previous visit (from the past 24 hour(s)).  Patient Active Problem List   Diagnosis Date Noted   Preeclampsia 11/19/2021   Fetal growth restriction antepartum 11/06/2021   Preterm uterine contractions 10/25/2021   Palpitations 10/18/2021   Pyelonephritis affecting pregnancy 08/16/2021   Vapes nicotine containing substance 06/18/2021   Supervision of high risk pregnancy in third trimester 06/15/2021    Assessment/Plan:  Sarah Campbell is a 21 y.o. G2P0010 at [redacted]w[redacted]d here for IOL 2/2 preE w/ SF, FGR (normal UAD and BPP). BP improved after one IV labetalol dose.   #Labor: Not yet in labor. Will start ripening with 25/25 cytotec. Consider FB if feasible at next check, 25 oral cyto q4hr if indicated.  #Pain: Desires epidural. Prn tylenol and flexeril for headache.  #FWB: Category 1 #ID:  GBS negative on PCR this admission #MOF: breast #MOC:copper IUD at pp f/u #Circ:  N/a  #Pre E w/ severe BP: continuous mag sulfate 2 g/hr. Continue home labetolol with prn IV for severe range BP.  - repeat CMP/CBC/mag at midnight. Creatinine increased slightly to 1.01. Close monitoring of urine output.   Margie Billet, MD PGY-2 11/28/2021  Attestation:  I confirm that I have verified the  information documented in the resident's note and that I have also personally reperformed the physical exam and all medical decision making activities.   The patient was seen and examined by me also Agree with note NST reactive and reassuring UCs as listed Cervical exams as listed in note  Aviva Signs, CNM

## 2021-11-28 NOTE — MAU Note (Signed)
[name] is a 21 y.o. at [redacted]w[redacted]d here in MAU reporting: started seeing spots and starts about an hour ago.  Checked her BP was155/104, repeat 5 min later was 160/104, called dr and was told to come in.  Has a HA, took some Tylenol earlier, did not budge.  Denies epigastric pain or increase in swelling. No bleeding or leaking, reports +FM  Onset of complaint: today Pain score: 6 Vitals:   11/28/21 1802  BP: (!) 144/96  Pulse: 73  Resp: 18  Temp: 98 F (36.7 C)  SpO2: 100%     FHT:152 Lab orders placed from triage:

## 2021-11-28 NOTE — MAU Provider Note (Signed)
History     [id number]  Arrival date and time: 11/28/21 1739    Chief Complaint  Patient presents with   Headache   Hypertension   visual changes     HPI Sarah Campbell is a 21 y.o. at [redacted]w[redacted]d who presents for headache. Recently diagnosed with preeclampsia & is scheduled for induction at 37 weeks. Denies history of hypertension.  Reports headache that started earlier today. Took 650 mg of tylenol without relief. Also reports decrease in peripheral visual acuity & photopsia. Denies epigastric pain. Reports good fetal movement.    OB History     Gravida  2   Para  0   Term  0   Preterm  0   AB  1   Living  0      SAB  1   IAB  0   Ectopic  0   Multiple  0   Live Births  0           Past Medical History:  Diagnosis Date   Heart murmur    Preeclampsia     Past Surgical History:  Procedure Laterality Date   NO PAST SURGERIES      Family History  Problem Relation Age of Onset   Alcoholism Father    Diabetes Maternal Grandfather    Heart attack Maternal Grandfather    Stroke Maternal Grandfather    Heart attack Paternal Grandmother    Heart attack Maternal Aunt    Stroke Maternal Aunt     No Known Allergies  No current facility-administered medications on file prior to encounter.   Current Outpatient Medications on File Prior to Encounter  Medication Sig Dispense Refill   acetaminophen (TYLENOL) 500 MG tablet Take 2 tablets (1,000 mg total) by mouth every 12 (twelve) hours as needed for fever or mild pain (for pain scale < 4  OR  temperature  >/=  100.5 F). 30 tablet 0   cyclobenzaprine (FLEXERIL) 5 MG tablet Take 1 tablet (5 mg total) by mouth 3 (three) times daily as needed for muscle spasms. 30 tablet 0   docusate sodium (COLACE) 100 MG capsule Take 1 capsule (100 mg total) by mouth 2 (two) times daily as needed for mild constipation. 30 capsule 1   ferrous sulfate 325 (65 FE) MG tablet Take 1 tablet (325 mg total) by mouth every other day.  45 tablet 2   labetalol (NORMODYNE) 200 MG tablet Take 1 tablet (200 mg total) by mouth 2 (two) times daily. 60 tablet 3   omeprazole (PRILOSEC OTC) 20 MG tablet Take 1 tablet (20 mg total) by mouth daily. 90 tablet 3   ondansetron (ZOFRAN-ODT) 4 MG disintegrating tablet Take 1 tablet (4 mg total) by mouth every 8 (eight) hours as needed for nausea or vomiting. 30 tablet 0   prenatal vitamin w/FE, FA (PRENATAL 1 + 1) 27-1 MG TABS tablet Take 1 tablet by mouth daily at 12 noon. 30 tablet 12   NIFEdipine (PROCARDIA) 10 MG capsule Take 1 capsule (10 mg total) by mouth every 6 (six) hours as needed (contractions). 30 capsule 1   [DISCONTINUED] omeprazole (PRILOSEC) 20 MG capsule Take 1 capsule (20 mg total) by mouth 2 (two) times daily. 10 capsule 0     ROS Pertinent positives and negative per HPI, all others reviewed and negative  Physical Exam   BP (!) 162/103   Pulse 75   Temp 98 F (36.7 C) (Oral)   Resp 18   Ht  5\' 6"  (1.676 m)   Wt 77.9 kg   LMP 03/21/2021   SpO2 96%   BMI 27.73 kg/m   Patient Vitals for the past 24 hrs:  BP Temp Temp src Pulse Resp SpO2 Height Weight  11/28/21 1901 (!) 162/103 -- -- 75 -- -- -- --  11/28/21 1845 (!) 163/112 -- -- 87 18 96 % -- --  11/28/21 1841 (!) 151/103 -- -- 71 18 -- -- --  11/28/21 1802 (!) 144/96 98 F (36.7 C) Oral 73 18 100 % 5\' 6"  (1.676 m) 77.9 kg    Physical Exam Vitals and nursing note reviewed.  Constitutional:      General: She is not in acute distress.    Appearance: She is well-developed.  HENT:     Head: Normocephalic and atraumatic.  Eyes:     Extraocular Movements: Extraocular movements intact.     Right eye: No nystagmus.     Pupils: Pupils are equal, round, and reactive to light.  Cardiovascular:     Rate and Rhythm: Normal rate and regular rhythm.     Heart sounds: Normal heart sounds.  Pulmonary:     Effort: Pulmonary effort is normal. No respiratory distress.     Breath sounds: Normal breath sounds.   Musculoskeletal:     Cervical back: Normal range of motion and neck supple.     Right lower leg: No edema.     Left lower leg: No edema.  Skin:    General: Skin is warm and dry.  Neurological:     Mental Status: She is alert.     Deep Tendon Reflexes:     Reflex Scores:      Patellar reflexes are 2+ on the right side and 2+ on the left side.    Comments: No clonus      Bedside Ultrasound Pt informed that the ultrasound is considered a limited OB ultrasound and is not intended to be a complete ultrasound exam.  Patient also informed that the ultrasound is not being completed with the intent of assessing for fetal or placental anomalies or any pelvic abnormalities.  Explained that the purpose of today's ultrasound is to assess for  presentation.  Patient acknowledges the purpose of the exam and the limitations of the study.     My interpretation: cephalic  FHT Baseline 150, moderate variability, 15x15 accels, no decels Toco: none Cat: 1  Labs Results for orders placed or performed during the hospital encounter of 11/28/21 (from the past 24 hour(s))  CBC     Status: Abnormal   Collection Time: 11/28/21  6:22 PM  Result Value Ref Range   WBC 10.4 4.0 - 10.5 K/uL   RBC 4.38 3.87 - 5.11 MIL/uL   Hemoglobin 12.0 12.0 - 15.0 g/dL   HCT 13/01/23 13/01/23 - 09.4 %   MCV 82.6 80.0 - 100.0 fL   MCH 27.4 26.0 - 34.0 pg   MCHC 33.1 30.0 - 36.0 g/dL   RDW 70.9 (H) 62.8 - 36.6 %   Platelets 306 150 - 400 K/uL   nRBC 0.2 0.0 - 0.2 %    Imaging No results found.  MAU Course  Procedures Lab Orders         Group B strep by PCR         CBC         Comprehensive metabolic panel         Protein / creatinine ratio, urine  Urinalysis, Routine w reflex microscopic Urine, Clean Catch         RPR    Meds ordered this encounter  Medications   lactated ringers infusion   magnesium bolus via infusion 4 g   magnesium sulfate 40 grams in SWI 1000 mL OB infusion   Imaging Orders  No  imaging studies ordered today    MDM Patient with known preeclampsia who presents with headache resistant to medication & visual disturbance. Will admit to birthing suites per consult with Dr. Vergie Living. Will start mag & give betamethasone. NICU notified.  Assessment and Plan   1. Severe pre-eclampsia in third trimester   2. [redacted] weeks gestation of pregnancy    -Admit to birthing suites -GBS pcr collected  Judeth Horn, NP 11/28/21 7:08 PM

## 2021-11-29 LAB — CBC WITH DIFFERENTIAL/PLATELET
Abs Immature Granulocytes: 0.26 10*3/uL — ABNORMAL HIGH (ref 0.00–0.07)
Abs Immature Granulocytes: 0.22 10*3/uL — ABNORMAL HIGH (ref 0.00–0.07)
Abs Immature Granulocytes: 0.27 10*3/uL — ABNORMAL HIGH (ref 0.00–0.07)
Basophils Absolute: 0 10*3/uL (ref 0.0–0.1)
Basophils Absolute: 0 10*3/uL (ref 0.0–0.1)
Basophils Absolute: 0 10*3/uL (ref 0.0–0.1)
Basophils Relative: 0 %
Basophils Relative: 0 %
Basophils Relative: 0 %
Eosinophils Absolute: 0 10*3/uL (ref 0.0–0.5)
Eosinophils Absolute: 0 10*3/uL (ref 0.0–0.5)
Eosinophils Absolute: 0 10*3/uL (ref 0.0–0.5)
Eosinophils Relative: 0 %
Eosinophils Relative: 0 %
Eosinophils Relative: 0 %
HCT: 40 % (ref 36.0–46.0)
HCT: 36.7 % (ref 36.0–46.0)
HCT: 35.9 % — ABNORMAL LOW (ref 36.0–46.0)
Hemoglobin: 12.9 g/dL (ref 12.0–15.0)
Hemoglobin: 11.7 g/dL — ABNORMAL LOW (ref 12.0–15.0)
Hemoglobin: 11.6 g/dL — ABNORMAL LOW (ref 12.0–15.0)
Immature Granulocytes: 2 %
Immature Granulocytes: 2 %
Immature Granulocytes: 2 %
Lymphocytes Relative: 11 %
Lymphocytes Relative: 12 %
Lymphocytes Relative: 10 %
Lymphs Abs: 1.3 10*3/uL (ref 0.7–4.0)
Lymphs Abs: 1.5 10*3/uL (ref 0.7–4.0)
Lymphs Abs: 1.5 10*3/uL (ref 0.7–4.0)
MCH: 26.5 pg (ref 26.0–34.0)
MCH: 26.5 pg (ref 26.0–34.0)
MCH: 26.7 pg (ref 26.0–34.0)
MCHC: 32.3 g/dL (ref 30.0–36.0)
MCHC: 31.9 g/dL (ref 30.0–36.0)
MCHC: 32.3 g/dL (ref 30.0–36.0)
MCV: 82.1 fL (ref 80.0–100.0)
MCV: 83 fL (ref 80.0–100.0)
MCV: 82.7 fL (ref 80.0–100.0)
Monocytes Absolute: 0.1 10*3/uL (ref 0.1–1.0)
Monocytes Absolute: 0.4 10*3/uL (ref 0.1–1.0)
Monocytes Absolute: 0.6 10*3/uL (ref 0.1–1.0)
Monocytes Relative: 0 %
Monocytes Relative: 3 %
Monocytes Relative: 4 %
Neutro Abs: 10.5 10*3/uL — ABNORMAL HIGH (ref 1.7–7.7)
Neutro Abs: 10.2 10*3/uL — ABNORMAL HIGH (ref 1.7–7.7)
Neutro Abs: 12.9 10*3/uL — ABNORMAL HIGH (ref 1.7–7.7)
Neutrophils Relative %: 87 %
Neutrophils Relative %: 83 %
Neutrophils Relative %: 84 %
Platelets: 305 10*3/uL (ref 150–400)
Platelets: 306 10*3/uL (ref 150–400)
Platelets: 284 10*3/uL (ref 150–400)
RBC: 4.87 MIL/uL (ref 3.87–5.11)
RBC: 4.42 MIL/uL (ref 3.87–5.11)
RBC: 4.34 MIL/uL (ref 3.87–5.11)
RDW: 16.5 % — ABNORMAL HIGH (ref 11.5–15.5)
RDW: 16.5 % — ABNORMAL HIGH (ref 11.5–15.5)
RDW: 16.6 % — ABNORMAL HIGH (ref 11.5–15.5)
WBC: 12.1 10*3/uL — ABNORMAL HIGH (ref 4.0–10.5)
WBC: 12.2 10*3/uL — ABNORMAL HIGH (ref 4.0–10.5)
WBC: 15.3 10*3/uL — ABNORMAL HIGH (ref 4.0–10.5)
nRBC: 0 % (ref 0.0–0.2)
nRBC: 0 % (ref 0.0–0.2)
nRBC: 0 % (ref 0.0–0.2)

## 2021-11-29 LAB — COMPREHENSIVE METABOLIC PANEL
ALT: 8 U/L (ref 0–44)
ALT: 11 U/L (ref 0–44)
ALT: 10 U/L (ref 0–44)
ALT: 10 U/L (ref 0–44)
AST: 20 U/L (ref 15–41)
AST: 26 U/L (ref 15–41)
AST: 23 U/L (ref 15–41)
AST: 21 U/L (ref 15–41)
Albumin: 2.1 g/dL — ABNORMAL LOW (ref 3.5–5.0)
Albumin: 2.4 g/dL — ABNORMAL LOW (ref 3.5–5.0)
Albumin: 2.2 g/dL — ABNORMAL LOW (ref 3.5–5.0)
Albumin: 2.3 g/dL — ABNORMAL LOW (ref 3.5–5.0)
Alkaline Phosphatase: 183 U/L — ABNORMAL HIGH (ref 38–126)
Alkaline Phosphatase: 192 U/L — ABNORMAL HIGH (ref 38–126)
Alkaline Phosphatase: 172 U/L — ABNORMAL HIGH (ref 38–126)
Alkaline Phosphatase: 172 U/L — ABNORMAL HIGH (ref 38–126)
Anion gap: 12 (ref 5–15)
Anion gap: 10 (ref 5–15)
Anion gap: 12 (ref 5–15)
Anion gap: 13 (ref 5–15)
BUN: 12 mg/dL (ref 6–20)
BUN: 12 mg/dL (ref 6–20)
BUN: 13 mg/dL (ref 6–20)
BUN: 13 mg/dL (ref 6–20)
CO2: 18 mmol/L — ABNORMAL LOW (ref 22–32)
CO2: 17 mmol/L — ABNORMAL LOW (ref 22–32)
CO2: 16 mmol/L — ABNORMAL LOW (ref 22–32)
CO2: 17 mmol/L — ABNORMAL LOW (ref 22–32)
Calcium: 8.1 mg/dL — ABNORMAL LOW (ref 8.9–10.3)
Calcium: 8.2 mg/dL — ABNORMAL LOW (ref 8.9–10.3)
Calcium: 7.6 mg/dL — ABNORMAL LOW (ref 8.9–10.3)
Calcium: 7.3 mg/dL — ABNORMAL LOW (ref 8.9–10.3)
Chloride: 104 mmol/L (ref 98–111)
Chloride: 104 mmol/L (ref 98–111)
Chloride: 104 mmol/L (ref 98–111)
Chloride: 104 mmol/L (ref 98–111)
Creatinine, Ser: 1.07 mg/dL — ABNORMAL HIGH (ref 0.44–1.00)
Creatinine, Ser: 1.09 mg/dL — ABNORMAL HIGH (ref 0.44–1.00)
Creatinine, Ser: 1.14 mg/dL — ABNORMAL HIGH (ref 0.44–1.00)
Creatinine, Ser: 1.12 mg/dL — ABNORMAL HIGH (ref 0.44–1.00)
GFR, Estimated: >60 mL/min (ref >60)
GFR, Estimated: >60 mL/min (ref >60)
GFR, Estimated: >60 mL/min (ref >60)
GFR, Estimated: >60 mL/min (ref >60)
Glucose, Bld: 126 mg/dL — ABNORMAL HIGH (ref 70–99)
Glucose, Bld: 123 mg/dL — ABNORMAL HIGH (ref 70–99)
Glucose, Bld: 134 mg/dL — ABNORMAL HIGH (ref 70–99)
Glucose, Bld: 101 mg/dL — ABNORMAL HIGH (ref 70–99)
Potassium: 3.9 mmol/L (ref 3.5–5.1)
Potassium: 4.6 mmol/L (ref 3.5–5.1)
Potassium: 3.8 mmol/L (ref 3.5–5.1)
Potassium: 4.9 mmol/L (ref 3.5–5.1)
Sodium: 134 mmol/L — ABNORMAL LOW (ref 135–145)
Sodium: 131 mmol/L — ABNORMAL LOW (ref 135–145)
Sodium: 132 mmol/L — ABNORMAL LOW (ref 135–145)
Sodium: 134 mmol/L — ABNORMAL LOW (ref 135–145)
Total Bilirubin: 0.5 mg/dL (ref 0.3–1.2)
Total Bilirubin: 0.4 mg/dL (ref 0.3–1.2)
Total Bilirubin: 0.2 mg/dL — ABNORMAL LOW (ref 0.3–1.2)
Total Bilirubin: 0.5 mg/dL (ref 0.3–1.2)
Total Protein: 5.6 g/dL — ABNORMAL LOW (ref 6.5–8.1)
Total Protein: 6.5 g/dL (ref 6.5–8.1)
Total Protein: 5.7 g/dL — ABNORMAL LOW (ref 6.5–8.1)
Total Protein: 5.8 g/dL — ABNORMAL LOW (ref 6.5–8.1)

## 2021-11-29 LAB — CBC
HCT: 33.2 % — ABNORMAL LOW (ref 36.0–46.0)
Hemoglobin: 10.8 g/dL — ABNORMAL LOW (ref 12.0–15.0)
MCH: 26.9 pg (ref 26.0–34.0)
MCHC: 32.5 g/dL (ref 30.0–36.0)
MCV: 82.6 fL (ref 80.0–100.0)
Platelets: 267 10*3/uL (ref 150–400)
RBC: 4.02 MIL/uL (ref 3.87–5.11)
RDW: 16.3 % — ABNORMAL HIGH (ref 11.5–15.5)
WBC: 11.1 10*3/uL — ABNORMAL HIGH (ref 4.0–10.5)
nRBC: 0 % (ref 0.0–0.2)

## 2021-11-29 LAB — PROTEIN / CREATININE RATIO, URINE
Creatinine, Urine: 204 mg/dL
Protein Creatinine Ratio: 7.01 mg/mg{Cre} — ABNORMAL HIGH (ref 0.00–0.15)
Total Protein, Urine: 1431 mg/dL

## 2021-11-29 LAB — CERVICOVAGINAL ANCILLARY ONLY
Chlamydia: NEGATIVE
Comment: NEGATIVE
Comment: NORMAL
Neisseria Gonorrhea: NEGATIVE

## 2021-11-29 LAB — MAGNESIUM
Magnesium: 4.9 mg/dL — ABNORMAL HIGH (ref 1.7–2.4)
Magnesium: 6.1 mg/dL (ref 1.7–2.4)

## 2021-11-29 LAB — RPR: RPR Ser Ql: NONREACTIVE

## 2021-11-29 MED ORDER — OXYTOCIN-SODIUM CHLORIDE 30-0.9 UT/500ML-% IV SOLN
1.0000 m[IU]/min | INTRAVENOUS | Status: DC
  Administered 2021-11-29: 2 m[IU]/min via INTRAVENOUS
  Filled 2021-11-29 (×2): qty 500

## 2021-11-29 MED ORDER — FENTANYL CITRATE (PF) 100 MCG/2ML IJ SOLN
INTRAMUSCULAR | Status: AC
  Administered 2021-11-29: 100 ug via INTRAVENOUS
  Filled 2021-11-29: qty 2

## 2021-11-29 MED ORDER — FAMOTIDINE IN NACL 20-0.9 MG/50ML-% IV SOLN
20.0000 mg | Freq: Once | INTRAVENOUS | Status: AC
  Administered 2021-11-29: 20 mg via INTRAVENOUS
  Filled 2021-11-29: qty 50

## 2021-11-29 MED ORDER — FENTANYL CITRATE (PF) 100 MCG/2ML IJ SOLN
100.0000 ug | INTRAMUSCULAR | Status: DC | PRN
  Administered 2021-11-29 (×2): 100 ug via INTRAVENOUS
  Filled 2021-11-29 (×2): qty 2

## 2021-11-29 MED ORDER — LABETALOL HCL 200 MG PO TABS
200.0000 mg | ORAL_TABLET | Freq: Two times a day (BID) | ORAL | Status: DC
  Administered 2021-11-29 (×2): 200 mg via ORAL
  Filled 2021-11-29 (×2): qty 1

## 2021-11-29 MED ORDER — TERBUTALINE SULFATE 1 MG/ML IJ SOLN: 0.2500 mg | Freq: Once | INTRAMUSCULAR | Status: DC | PRN

## 2021-11-29 NOTE — Progress Notes (Signed)
Labor Progress Note Sarah Campbell is a 21 y.o. G2P0010 at [redacted]w[redacted]d presented for IOL for pre E with severe range BP, now improved w/ labetalol.   S: No acute concerns.   O:  BP 130/70   Pulse 91   Temp 98.9 F (37.2 C) (Oral)   Resp 20   Ht 5\' 6"  (1.676 m)   Wt 77.9 kg   LMP 03/21/2021   SpO2 95%   BMI 27.73 kg/m  EFM: 120 bpm/moderate variability/accel present, no decel  CVE: Dilation: Fingertip Effacement (%): Thick Cervical Position: Posterior Station: -2 Presentation: Vertex Exam by:: Dr. Elouise Munroe   A&P: 21 y.o. G2P0010 [redacted]w[redacted]d here for IOL for preE with severe features.  #Labor:FB attempted x2 (regular and cook), unsuccessful. Start pit now and attempt FB at next exam. Intermittent variable with cytotec prior.  #Pain: planning epidural #FWB: category 1 #GBS negative  #Pre E w/ severe BP: continuous mag sulfate 2 g/hr. Continue home labetolol with prn IV for severe range BP.  - am labs (CMP/CBC/mag) Cr increasing 1.14 Continue trending q6hr.  -strict I/Os  Gerlene Fee, DO [facility] 1:21 PM

## 2021-11-29 NOTE — Progress Notes (Addendum)
Labor Progress Note Sarah Campbell is a 21 y.o. G2P0010 at 103w1d presented for IOL for pre E with severe range BP, on labetalol.   S: She is doing well. Able to sleep a bit and has been dozing off. Denies CP, HA, tingling, SOB.  O:  BP 134/89 Comment: Simultaneous filing. User may not have seen previous data.  Pulse 71 Comment: Simultaneous filing. User may not have seen previous data.  Temp 98 F (36.7 C) (Oral)   Resp 18   Ht 5\' 6"  (1.676 m)   Wt 77.9 kg   LMP 03/21/2021   SpO2 99%   BMI 27.73 kg/m  EFM: 115 bpm/moderate variability/accels present, no decel  CVE: Dilation: 4.5 Effacement (%): 60, 70 Cervical Position: Posterior Station: -3 Presentation: Vertex Exam by:: Agricultural engineer every 1.5-6 min. 2+DTRs.  A&P: 21 y.o. G2P0010 [redacted]w[redacted]d here for IOL for preE with severe features.  #Labor: On Pit 16. Continue 2x2. Consider AROM when appropriate, holding for now. #Pain: Planning epidural #FWB: category 1 #GBS negative  #Pre E w/ severe BP: continuous mag sulfate 2 g/hr. Continue home labetolol with prn IV for severe range BP.  - AM labs (CMP/CBC/mag) Cr 1.14 > 1.12  - Continue trending q6hr.  - Strict I/Os  Wilhemina Cash, MD [facility] 11:52 PM

## 2021-11-29 NOTE — Progress Notes (Signed)
Labor Progress Note Sarah Campbell is a 21 y.o. G2P0010 at [redacted]w[redacted]d presented for IOL for pre E with severe range BP, now improved w/ labetalol.  S: Patient is resting comfortably. Starting to feel contractions intermittently. Having some low back pain improved with flexeril. Headache has resolved after tylenol.   O:  BP (!) 158/103   Pulse 70   Temp 97.8 F (36.6 C) (Oral)   Resp 16   Ht 5\' 6"  (1.676 m)   Wt 77.9 kg   LMP 03/21/2021   SpO2 99%   BMI 27.73 kg/m  EFM: 125 bpm/fair variability/accel present, no decel  CVE: Dilation: Fingertip Effacement (%): Thick Cervical Position: Posterior Station: -2 Presentation: Vertex Exam by:: Dr. Elouise Munroe   A&P: 21 y.o. G2P0010 [redacted]w[redacted]d here for IOL for preE with severe features.  #Labor: Progressing well. Cervix not quite favorable for foley bulb, will give third dose of cytotec 25 mg po. Consider FB at next check.  #Pain: planning epidural #FWB: category 1 #GBS negative  #Pre E w/ severe BP: continuous mag sulfate 2 g/hr. Continue home labetolol with prn IV for severe range BP.  - am labs (CMP/CBC/mag) pending. Creatinine has been slowly increasing, 1.07 at 0000. Continue trending q6hr.   Cecilio Asper, MD [facility] 7:03 AM

## 2021-11-29 NOTE — Progress Notes (Signed)
Labor Progress Note Sarah Campbell is a 21 y.o. G2P0010 at [redacted]w[redacted]d presented for IOL for pre E with severe range BP, now improved w/ labetalol.   S: No acute concerns.   O:  BP 121/77   Pulse 98   Temp 98.9 F (37.2 C) (Oral)   Resp 16   Ht 5\' 6"  (1.676 m)   Wt 77.9 kg   LMP 03/21/2021   SpO2 100%   BMI 27.73 kg/m  EFM: 120 bpm/moderate variability/accel present, no decel  CVE: Dilation: 1 Effacement (%): 60, 70 Cervical Position: Posterior Station: -2 Presentation: Vertex Exam by:: Dr. Janus Molder   A&P: 21 y.o. G2P0010 [redacted]w[redacted]d here for IOL for preE with severe features.  #Labor: FB placed. Leave pit on 8 until FB out. Increase pitocin from there 2/2. Consider AROM when appropriate. #Pain: planning epidural #FWB: category 1 #GBS negative  #Pre E w/ severe BP: continuous mag sulfate 2 g/hr. Continue home labetolol with prn IV for severe range BP.  - am labs (CMP/CBC/mag) Cr increasing 1.14 Continue trending q6hr.  -strict I/Os  Gerlene Fee, DO [facility] 3:16 PM

## 2021-11-29 NOTE — Progress Notes (Signed)
Labor Progress Note Sarah Campbell is a 21 y.o. G2P0010 at [redacted]w[redacted]d presented for IOL for pre E with severe range BP, on labetalol.   S: Currently she is very tired. Has not slept much. Having a lot of heartburn and nausea. Foley bulb came out, and she reports it had made her contract more frequently.   O:  BP 123/82   Pulse 80   Temp 97.8 F (36.6 C) (Oral)   Resp 18   Ht 5\' 6"  (1.676 m)   Wt 77.9 kg   LMP 03/21/2021   SpO2 99%   BMI 27.73 kg/m  EFM: 115 bpm/moderate variability/accels present, no decel  CVE: Dilation: 4 Effacement (%): 60 Cervical Position: Posterior Station: -3 Presentation: Vertex Exam by:: Data processing manager RN   A&P: 21 y.o. G2P0010 [redacted]w[redacted]d here for IOL for preE with severe features.  #Labor: FB out. Continue pit at 8. Consider AROM when appropriate, holding for now. #Pain: Planning epidural #FWB: category 1 #GBS negative  #Pre E w/ severe BP: continuous mag sulfate 2 g/hr. Continue home labetolol with prn IV for severe range BP.  - AM labs (CMP/CBC/mag) Cr 1.14 > 1.12  - Continue trending q6hr.  - Strict I/Os  Wilhemina Cash, MD [facility] [place] 8:53 PM

## 2021-11-30 ENCOUNTER — Inpatient Hospital Stay (HOSPITAL_COMMUNITY): Payer: 59 | Admitting: Anesthesiology

## 2021-11-30 ENCOUNTER — Other Ambulatory Visit: Payer: Medicaid Other

## 2021-11-30 LAB — COMPREHENSIVE METABOLIC PANEL
ALT: 12 U/L (ref 0–44)
AST: 30 U/L (ref 15–41)
Albumin: 2.3 g/dL — ABNORMAL LOW (ref 3.5–5.0)
Alkaline Phosphatase: 177 U/L — ABNORMAL HIGH (ref 38–126)
Anion gap: 15 (ref 5–15)
BUN: 13 mg/dL (ref 6–20)
CO2: 16 mmol/L — ABNORMAL LOW (ref 22–32)
Calcium: 6.5 mg/dL — ABNORMAL LOW (ref 8.9–10.3)
Chloride: 99 mmol/L (ref 98–111)
Creatinine, Ser: 1.18 mg/dL — ABNORMAL HIGH (ref 0.44–1.00)
GFR, Estimated: >60 mL/min (ref >60)
Glucose, Bld: 126 mg/dL — ABNORMAL HIGH (ref 70–99)
Potassium: 4.5 mmol/L (ref 3.5–5.1)
Sodium: 130 mmol/L — ABNORMAL LOW (ref 135–145)
Total Bilirubin: 0.2 mg/dL — ABNORMAL LOW (ref 0.3–1.2)
Total Protein: 5.8 g/dL — ABNORMAL LOW (ref 6.5–8.1)

## 2021-11-30 LAB — CBC WITH DIFFERENTIAL/PLATELET
Abs Immature Granulocytes: 0.32 10*3/uL — ABNORMAL HIGH (ref 0.00–0.07)
Abs Immature Granulocytes: 0.4 10*3/uL — ABNORMAL HIGH (ref 0.00–0.07)
Basophils Absolute: 0 10*3/uL (ref 0.0–0.1)
Basophils Absolute: 0 10*3/uL (ref 0.0–0.1)
Basophils Relative: 0 %
Basophils Relative: 0 %
Eosinophils Absolute: 0 10*3/uL (ref 0.0–0.5)
Eosinophils Absolute: 0 10*3/uL (ref 0.0–0.5)
Eosinophils Relative: 0 %
Eosinophils Relative: 0 %
HCT: 35.2 % — ABNORMAL LOW (ref 36.0–46.0)
HCT: 35.8 % — ABNORMAL LOW (ref 36.0–46.0)
Hemoglobin: 11.3 g/dL — ABNORMAL LOW (ref 12.0–15.0)
Hemoglobin: 11.5 g/dL — ABNORMAL LOW (ref 12.0–15.0)
Immature Granulocytes: 2 %
Immature Granulocytes: 2 %
Lymphocytes Relative: 6 %
Lymphocytes Relative: 7 %
Lymphs Abs: 1.2 10*3/uL (ref 0.7–4.0)
Lymphs Abs: 1.3 10*3/uL (ref 0.7–4.0)
MCH: 26.9 pg (ref 26.0–34.0)
MCH: 27.1 pg (ref 26.0–34.0)
MCHC: 32.1 g/dL (ref 30.0–36.0)
MCHC: 32.1 g/dL (ref 30.0–36.0)
MCV: 83.8 fL (ref 80.0–100.0)
MCV: 84.4 fL (ref 80.0–100.0)
Monocytes Absolute: 0.6 10*3/uL (ref 0.1–1.0)
Monocytes Absolute: 0.6 10*3/uL (ref 0.1–1.0)
Monocytes Relative: 3 %
Monocytes Relative: 3 %
Neutro Abs: 17.5 10*3/uL — ABNORMAL HIGH (ref 1.7–7.7)
Neutro Abs: 18 10*3/uL — ABNORMAL HIGH (ref 1.7–7.7)
Neutrophils Relative %: 88 %
Neutrophils Relative %: 89 %
Platelets: 285 10*3/uL (ref 150–400)
Platelets: 287 10*3/uL (ref 150–400)
RBC: 4.17 MIL/uL (ref 3.87–5.11)
RBC: 4.27 MIL/uL (ref 3.87–5.11)
RDW: 16.7 % — ABNORMAL HIGH (ref 11.5–15.5)
RDW: 16.8 % — ABNORMAL HIGH (ref 11.5–15.5)
WBC: 19.8 10*3/uL — ABNORMAL HIGH (ref 4.0–10.5)
WBC: 20.3 10*3/uL — ABNORMAL HIGH (ref 4.0–10.5)
nRBC: 0 % (ref 0.0–0.2)
nRBC: 0 % (ref 0.0–0.2)

## 2021-11-30 LAB — MAGNESIUM
Magnesium: 7 mg/dL (ref 1.7–2.4)
Magnesium: 7 mg/dL (ref 1.7–2.4)

## 2021-11-30 MED ORDER — WITCH HAZEL-GLYCERIN EX PADS: 1.0000 | MEDICATED_PAD | CUTANEOUS | Status: DC | PRN

## 2021-11-30 MED ORDER — ZOLPIDEM TARTRATE 5 MG PO TABS: 5.0000 mg | ORAL_TABLET | Freq: Every evening | ORAL | Status: DC | PRN

## 2021-11-30 MED ORDER — FUROSEMIDE 20 MG PO TABS
20.0000 mg | ORAL_TABLET | Freq: Two times a day (BID) | ORAL | Status: DC
  Administered 2021-11-30 – 2021-12-02 (×5): 20 mg via ORAL
  Filled 2021-11-30 (×5): qty 1

## 2021-11-30 MED ORDER — SENNOSIDES-DOCUSATE SODIUM 8.6-50 MG PO TABS
2.0000 | ORAL_TABLET | Freq: Every day | ORAL | Status: DC
  Administered 2021-12-01 – 2021-12-02 (×2): 2 via ORAL
  Filled 2021-11-30 (×2): qty 2

## 2021-11-30 MED ORDER — ONDANSETRON HCL 4 MG/2ML IJ SOLN: 4.0000 mg | INTRAMUSCULAR | Status: DC | PRN

## 2021-11-30 MED ORDER — PHENYLEPHRINE 80 MCG/ML (10ML) SYRINGE FOR IV PUSH (FOR BLOOD PRESSURE SUPPORT): 80.0000 ug | PREFILLED_SYRINGE | INTRAVENOUS | Status: DC | PRN

## 2021-11-30 MED ORDER — SIMETHICONE 80 MG PO CHEW: 80.0000 mg | CHEWABLE_TABLET | ORAL | Status: DC | PRN

## 2021-11-30 MED ORDER — BENZOCAINE-MENTHOL 20-0.5 % EX AERO: 1.0000 | INHALATION_SPRAY | CUTANEOUS | Status: DC | PRN

## 2021-11-30 MED ORDER — COCONUT OIL OIL: 1.0000 | TOPICAL_OIL | Status: DC | PRN

## 2021-11-30 MED ORDER — LIDOCAINE-EPINEPHRINE (PF) 2 %-1:200000 IJ SOLN
INTRAMUSCULAR | Status: DC | PRN
  Administered 2021-11-30: 5 mL via EPIDURAL

## 2021-11-30 MED ORDER — DIBUCAINE (PERIANAL) 1 % EX OINT: 1.0000 | TOPICAL_OINTMENT | CUTANEOUS | Status: DC | PRN

## 2021-11-30 MED ORDER — FENTANYL-BUPIVACAINE-NACL 0.5-0.125-0.9 MG/250ML-% EP SOLN
12.0000 mL/h | EPIDURAL | Status: DC | PRN
  Administered 2021-11-30: 12 mL/h via EPIDURAL
  Filled 2021-11-30: qty 250

## 2021-11-30 MED ORDER — EPHEDRINE 5 MG/ML INJ: 10.0000 mg | INTRAVENOUS | Status: DC | PRN

## 2021-11-30 MED ORDER — NIFEDIPINE ER OSMOTIC RELEASE 30 MG PO TB24
30.0000 mg | ORAL_TABLET | Freq: Every day | ORAL | Status: DC
  Administered 2021-11-30: 30 mg via ORAL
  Filled 2021-11-30: qty 1

## 2021-11-30 MED ORDER — DIPHENHYDRAMINE HCL 50 MG/ML IJ SOLN: 12.5000 mg | INTRAMUSCULAR | Status: DC | PRN

## 2021-11-30 MED ORDER — PRENATAL MULTIVITAMIN CH
1.0000 | ORAL_TABLET | Freq: Every day | ORAL | Status: DC
  Administered 2021-11-30 – 2021-12-01 (×2): 1 via ORAL
  Filled 2021-11-30 (×2): qty 1

## 2021-11-30 MED ORDER — LACTATED RINGERS IV SOLN: 500.0000 mL | Freq: Once | INTRAVENOUS | Status: AC

## 2021-11-30 MED ORDER — IBUPROFEN 600 MG PO TABS
600.0000 mg | ORAL_TABLET | Freq: Four times a day (QID) | ORAL | Status: DC
  Administered 2021-11-30 – 2021-12-02 (×8): 600 mg via ORAL
  Filled 2021-11-30 (×8): qty 1

## 2021-11-30 MED ORDER — ACETAMINOPHEN 325 MG PO TABS: 650.0000 mg | ORAL_TABLET | ORAL | Status: DC | PRN

## 2021-11-30 MED ORDER — ONDANSETRON HCL 4 MG PO TABS
4.0000 mg | ORAL_TABLET | ORAL | Status: DC | PRN
  Administered 2021-11-30: 4 mg via ORAL
  Filled 2021-11-30: qty 1

## 2021-11-30 MED ORDER — DIPHENHYDRAMINE HCL 25 MG PO CAPS: 25.0000 mg | ORAL_CAPSULE | Freq: Four times a day (QID) | ORAL | Status: DC | PRN

## 2021-11-30 NOTE — Discharge Summary (Signed)
Postpartum Discharge Summary  Date of Service updated-11/5     Patient Name: Sarah Campbell DOB: 2000/11/17 MRN: [id number]  Date of admission: 11/28/2021 Delivery date:11/30/2021  Delivering provider: Wilhemina Cash  Date of discharge: 12/02/2021  Admitting diagnosis: Severe preeclampsia [O14.10] Intrauterine pregnancy: [redacted]w[redacted]d    Secondary diagnosis:  Principal Problem:   Severe preeclampsia Active Problems:   Vaginal delivery  Additional problems: Fetal growth restriction    Discharge diagnosis: Preterm Pregnancy Delivered and Preeclampsia (severe)                                              Post partum procedures: none Augmentation: Pitocin, Cytotec, and IP Foley Complications: None  Hospital course: Induction of Labor With Vaginal Delivery   21y.o. yo G2P0010 at 363w2das admitted to the hospital 11/28/2021 for induction of labor.  Indication for induction: Preeclampsia.  Patient was induced with cytotec, Foley, Pitocin. Membrane Rupture Time/Date: 4:33 AM ,11/30/2021   Delivery Method:Vaginal, Spontaneous  Episiotomy: None  Lacerations:  1st degree  Details of delivery can be found in separate delivery note.  Due to preeclampsia, she was treated with IV Magensium x 24hr.  BP was treated with oral procardia and Lasix.  Her postpartum course was otherwise uncomplicated. Patient is discharged home 12/02/21.  Newborn Data: Birth date:11/30/2021  Birth time:6:03 AM  Gender:Female  Living status:Living  Apgars:6 ,8  Weight:2098 g   Magnesium Sulfate received: Yes: Seizure prophylaxis BMZ received: No Rhophylac:N/A MMR:N/A T-DaP:Given prenatally Flu: Yes Transfusion:No  Physical exam  Vitals:   12/01/21 1618 12/01/21 2006 12/02/21 0013 12/02/21 0347  BP: 123/82 (!) 142/90 (!) 141/102 (!) 137/94  Pulse: 96 80 85 75  Resp: _0 Temp: 98.2 F (36.8 C) 98.2 F (36.8 C) 98.4 F (36.9 C) 98 F (36.7 C)  TempSrc: Oral Oral Oral Oral  SpO2: 99% 99%  99% 99%  Weight:      Height:       General: alert, cooperative, and no distress Lochia: appropriate Uterine Fundus: firm Incision: N/A DVT Evaluation: No evidence of DVT seen on physical exam. Labs: Lab Results  Component Value Date   WBC 15.7 (H) 12/01/2021   HGB 10.6 (L) 12/01/2021   HCT 34.1 (L) 12/01/2021   MCV 85.5 12/01/2021   PLT 307 12/01/2021      Latest Ref Rng & Units 12/01/2021    4:15 AM  CMP  Glucose 70 - 99 mg/dL 95   BUN 6 - 20 mg/dL 14   Creatinine 0.44 - 1.00 mg/dL 0.96   Sodium 135 - 145 mmol/L 140   Potassium 3.5 - 5.1 mmol/L 4.3   Chloride 98 - 111 mmol/L 108   CO2 22 - 32 mmol/L 21   Calcium 8.9 - 10.3 mg/dL 6.9   Total Protein 6.5 - 8.1 g/dL 5.4   Total Bilirubin 0.3 - 1.2 mg/dL 0.3   Alkaline Phos 38 - 126 U/L 155   AST 15 - 41 U/L 29   ALT 0 - 44 U/L 14    Edinburgh Score:     No data to display           After visit meds:  Allergies as of 12/02/2021   No Known Allergies      Medication List     STOP taking these medications  cyclobenzaprine 5 MG tablet Commonly known as: FLEXERIL   labetalol 200 MG tablet Commonly known as: NORMODYNE   NIFEdipine 10 MG capsule Commonly known as: Procardia Replaced by: NIFEdipine 90 MG 24 hr tablet       TAKE these medications    acetaminophen 325 MG tablet Commonly known as: Tylenol Take 2 tablets (650 mg total) by mouth every 6 (six) hours as needed (for pain scale < 4). What changed:  medication strength how much to take when to take this reasons to take this   docusate sodium 100 MG capsule Commonly known as: COLACE Take 1 capsule (100 mg total) by mouth 2 (two) times daily as needed for mild constipation.   ferrous sulfate 325 (65 FE) MG tablet Take 1 tablet (325 mg total) by mouth every other day.   furosemide 20 MG tablet Commonly known as: LASIX Take 1 tablet (20 mg total) by mouth 2 (two) times daily for 3 days.   ibuprofen 600 MG tablet Commonly known as:  ADVIL Take 1 tablet (600 mg total) by mouth every 6 (six) hours.   NIFEdipine 90 MG 24 hr tablet Commonly known as: PROCARDIA XL/NIFEDICAL-XL Take 1 tablet (90 mg total) by mouth daily. Replaces: NIFEdipine 10 MG capsule   omeprazole 20 MG tablet Commonly known as: PriLOSEC OTC Take 1 tablet (20 mg total) by mouth daily.   ondansetron 4 MG disintegrating tablet Commonly known as: ZOFRAN-ODT Take 1 tablet (4 mg total) by mouth every 8 (eight) hours as needed for nausea or vomiting.   prenatal vitamin w/FE, FA 27-1 MG Tabs tablet Take 1 tablet by mouth daily at 12 noon.         Discharge home in stable condition Infant Feeding: Bottle and Breast Infant Disposition:home with mother Discharge instruction: per After Visit Summary and Postpartum booklet. Activity: Advance as tolerated. Pelvic rest for 6 weeks.  Diet: routine diet Future Appointments: Future Appointments  Date Time Provider [place]  12/07/2021 11:30 AM [facility] NURSE [facility]  01/02/2022  9:30 AM Myrtis Ser, CNM [facility]  01/08/2022 11:35 AM [id number]   Follow up Visit:  Follow-up Information     [facility] Follow up.   Specialty: Obstetrics and Gynecology Why: Follow up in one week for a BP check Contact information: [place] [phone]                 Please schedule this patient for a In person postpartum visit in 4 weeks with the following provider: Any provider. Additional Postpartum F/U:BP check 1 week  Low risk pregnancy complicated by: HTN Delivery mode:  Vaginal, Spontaneous  Anticipated Birth Control:  IUD   12/02/2021 Annalee Genta, DO

## 2021-11-30 NOTE — Lactation Note (Signed)
This note was copied from a baby's chart. Lactation Consultation Note  Patient Name: [name] QIWLN'L Date: 11/30/2021 Reason for consult: Initial assessment;1st time breastfeeding;Late-preterm 34-36.6wks;Infant < 6lbs Age:22 hours  P1, Baby [redacted]w[redacted]d PMA.  < 5 lbs. Set up DEBP and mother pumped 5 ml with 21 flanges. Baby began to wake.  Demonstrated how to pace feed with extra slow flow nipple.  Baby took 5 ml of breastmilk and mother will supplement additional 24 cal formula. Reviewed crib card with volume guidelines of 10-11 ml. Reminded mother to pump q 3hours and once termperature is stable she can allow baby to nuzzle at breast. All feedings should be complete within 30 min.   Maternal Data Has patient been taught Hand Expression?: Yes Does the patient have breastfeeding experience prior to this delivery?: No  Feeding Mother's Current Feeding Choice: Breast Milk and Formula Nipple Type: Extra Slow Flow   Lactation Tools Discussed/Used Tools: Pump;Flanges Flange Size: 21 Breast pump type: Double-Electric Breast Pump Pump Education: Setup, frequency, and cleaning;Milk Storage Reason for Pumping: stimulaton and supplementation Pumping frequency: q 3hours Pumped volume: 5 mL  Interventions Interventions: DEBP;Education;LC Services brochure  Discharge Pump: Personal;Hands Free  Consult Status Consult Status: Follow-up Date: 12/01/21 Follow-up type: In-patient    [name] [facility] 11/30/2021, 1:56 PM

## 2021-11-30 NOTE — Progress Notes (Signed)
Patient Vitals for the past 4 hrs:  BP Temp Temp src Pulse Resp SpO2  11/30/21 0501 129/79 -- -- 74 -- --  11/30/21 0431 112/69 -- -- 65 -- --  11/30/21 0400 107/68 -- -- 66 16 --  11/30/21 0331 117/67 -- -- 67 18 --  11/30/21 0326 -- 97.8 F (36.6 C) Oral -- -- --  11/30/21 0301 115/72 -- -- 67 18 --  11/30/21 0241 130/82 -- -- 71 -- --  11/30/21 0240 -- -- -- -- -- 100 %  11/30/21 0236 132/88 -- -- 74 -- --  11/30/21 0235 -- -- -- -- -- 100 %  11/30/21 0231 133/82 -- -- 72 -- --  11/30/21 0230 -- -- -- -- -- 99 %  11/30/21 0226 138/86 -- -- 75 18 --  11/30/21 0225 -- -- -- -- -- 100 %  11/30/21 0221 139/84 -- -- 78 -- --  11/30/21 0220 -- -- -- -- -- 100 %  11/30/21 0216 139/84 -- -- 79 -- --  11/30/21 0215 -- -- -- -- -- 100 %  11/30/21 0213 (!) 143/94 -- -- 77 -- --  11/30/21 0211 (!) 147/97 -- -- 74 -- --  11/30/21 0210 -- -- -- -- -- 100 %  11/30/21 0208 (!) 138/103 -- -- 80 -- --  11/30/21 0205 -- -- -- -- -- 99 %  11/30/21 0201 (!) 116/98 -- -- 81 -- --  11/30/21 0131 (!) 142/98 -- -- 84 18 --   Had SROM, clear fluid. Cx was 4-5/60,70/-2 a few hours ago.  Comfortable w/epidural.  Ctx spaced out after epidural, pitocin @ 28 mu/min.  FHR baseling 110, variability minimal at times, generally moderate.  Occ accel, occ mild variable.  Mg level 7.0.  Consider pitocin break if ctx don't increase now that SROM occurred.

## 2021-11-30 NOTE — Progress Notes (Signed)
Labor Progress Note Sarah Campbell is a 21 y.o. G2P0010 at [redacted]w[redacted]d presented for IOL for pre E with severe range BP, on labetalol.   S: Sleeping.  O:  BP 112/69   Pulse 65   Temp 97.8 F (36.6 C) (Oral)   Resp 16   Ht 5\' 6"  (1.676 m)   Wt 77.9 kg   LMP 03/21/2021   SpO2 100%   BMI 27.73 kg/m  EFM: 110 bpm/moderate variability/no accels, no decel  CVE: Dilation: 4.5 Effacement (%): 70, 60 Cervical Position: Posterior Station: -2 Presentation: Vertex Exam by:: Ronalee Belts RN and Agricultural engineer every 4-6.5 min.   A&P: 21 y.o. G2P0010 [redacted]w[redacted]d here for IOL for preE with severe features.  #Labor: On Pit 30. SROM at 0433 w/small amount of fluid #Pain: Has epidural #FWB: category 2, continue to monitor #GBS negative  #Pre E w/ severe BP: continuous mag sulfate 2 g/hr. Continue home labetolol with prn IV for severe range BP.  - AM labs (CMP/CBC/mag) Cr 1.14 > 1.12  - Continue trending q6hr.  - Strict I/Os  Wilhemina Cash, MD [facility] [place] AM

## 2021-11-30 NOTE — Progress Notes (Signed)
Pt complains of double vision and nausea - will notify MD

## 2021-11-30 NOTE — Anesthesia Preprocedure Evaluation (Signed)
Anesthesia Evaluation  Patient identified by MRN, date of birth, ID band Patient awake    Reviewed: Allergy & Precautions, NPO status , Patient's Chart, lab work & pertinent test results  Airway Mallampati: II  TM Distance: >3 FB Neck ROM: Full    Dental no notable dental hx.    Pulmonary neg pulmonary ROS, Current Smoker   Pulmonary exam normal breath sounds clear to auscultation       Cardiovascular hypertension (preE with severe features), Pt. on medications and Pt. on home beta blockers Normal cardiovascular exam Rhythm:Regular Rate:Normal     Neuro/Psych negative neurological ROS  negative psych ROS   GI/Hepatic negative GI ROS, Neg liver ROS,,,  Endo/Other  negative endocrine ROS    Renal/GU negative Renal ROS  negative genitourinary   Musculoskeletal negative musculoskeletal ROS (+)    Abdominal   Peds  Hematology negative hematology ROS (+)   Anesthesia Other Findings IOL for preE with severe features on Mag  Reproductive/Obstetrics (+) Pregnancy                             Anesthesia Physical Anesthesia Plan  ASA: 3  Anesthesia Plan: Epidural   Post-op Pain Management:    Induction:   PONV Risk Score and Plan: Treatment may vary due to age or medical condition  Airway Management Planned: Natural Airway  Additional Equipment:   Intra-op Plan:   Post-operative Plan:   Informed Consent: I have reviewed the patients History and Physical, chart, labs and discussed the procedure including the risks, benefits and alternatives for the proposed anesthesia with the patient or authorized representative who has indicated his/her understanding and acceptance.       Plan Discussed with: Anesthesiologist  Anesthesia Plan Comments: (Patient identified. Risks, benefits, options discussed with patient including but not limited to bleeding, infection, nerve damage, paralysis,  failed block, incomplete pain control, headache, blood pressure changes, nausea, vomiting, reactions to medication, itching, and post partum back pain. Confirmed with bedside nurse the patient's most recent platelet count. Confirmed with the patient that they are not taking any anticoagulation, have any bleeding history or any family history of bleeding disorders. Patient expressed understanding and wishes to proceed. All questions were answered. )       Anesthesia Quick Evaluation

## 2021-11-30 NOTE — Lactation Note (Signed)
This note was copied from a baby's chart. Lactation Consultation Note  Patient Name: Girl Porschea Borys OTLXB'W Date: 11/30/2021   Age:22 hours RN stated that baby weight was 4.10 lbs and has requested 24 cal formula. RN recommended not to BF at this time but to get some calories in baby first. Mom holding baby STS.  Maternal Data    Feeding    LATCH Score                    Lactation Tools Discussed/Used    Interventions    Discharge    Consult Status      Theodoro Kalata 11/30/2021, 6:51 AM

## 2021-11-30 NOTE — Anesthesia Postprocedure Evaluation (Signed)
Anesthesia Post Note  Patient: Sarah Campbell  Procedure(s) Performed: AN AD HOC LABOR EPIDURAL     Patient location during evaluation: Mother Baby Anesthesia Type: Epidural Level of consciousness: awake and alert Pain management: pain level controlled Vital Signs Assessment: post-procedure vital signs reviewed and stable Respiratory status: spontaneous breathing, nonlabored ventilation and respiratory function stable Cardiovascular status: stable Postop Assessment: no headache, no backache and epidural receding Anesthetic complications: no   No notable events documented.  Last Vitals:  Vitals:   11/30/21 1225 11/30/21 1320  BP:    Pulse:    Resp: 17 17  Temp:    SpO2:      Last Pain:  Vitals:   11/30/21 0819  TempSrc: Oral  PainSc: 0-No pain   Pain Goal: Patients Stated Pain Goal: 3 (11/30/21 0818)                 Barkley Boards

## 2021-11-30 NOTE — Anesthesia Procedure Notes (Signed)
Epidural Patient location during procedure: OB Start time: 11/30/2021 2:00 AM End time: 11/30/2021 2:10 AM  Staffing Anesthesiologist: Freddrick March, MD Performed: anesthesiologist   Preanesthetic Checklist Completed: patient identified, IV checked, risks and benefits discussed, monitors and equipment checked, pre-op evaluation and timeout performed  Epidural Patient position: sitting Prep: DuraPrep and site prepped and draped Patient monitoring: continuous pulse ox, blood pressure, heart rate and cardiac monitor Approach: midline Location: L3-L4 Injection technique: LOR air  Needle:  Needle type: Tuohy  Needle gauge: 17 G Needle length: 9 cm Needle insertion depth: 5 cm Catheter type: closed end flexible Catheter size: 19 Gauge Catheter at skin depth: 10 cm Test dose: negative  Assessment Sensory level: T8 Events: blood not aspirated, injection not painful, no injection resistance, no paresthesia and negative IV test  Additional Notes Patient identified. Risks/Benefits/Options discussed with patient including but not limited to bleeding, infection, nerve damage, paralysis, failed block, incomplete pain control, headache, blood pressure changes, nausea, vomiting, reactions to medication both or allergic, itching and postpartum back pain. Confirmed with bedside nurse the patient's most recent platelet count. Confirmed with patient that they are not currently taking any anticoagulation, have any bleeding history or any family history of bleeding disorders. Patient expressed understanding and wished to proceed. All questions were answered. Sterile technique was used throughout the entire procedure. Please see nursing notes for vital signs. Test dose was given through epidural catheter and negative prior to continuing to dose epidural or start infusion. Warning signs of high block given to the patient including shortness of breath, tingling/numbness in hands, complete motor block,  or any concerning symptoms with instructions to call for help. Patient was given instructions on fall risk and not to get out of bed. All questions and concerns addressed with instructions to call with any issues or inadequate analgesia.  Reason for block:procedure for pain

## 2021-12-01 LAB — COMPREHENSIVE METABOLIC PANEL
ALT: 14 U/L (ref 0–44)
AST: 29 U/L (ref 15–41)
Albumin: 2.2 g/dL — ABNORMAL LOW (ref 3.5–5.0)
Alkaline Phosphatase: 155 U/L — ABNORMAL HIGH (ref 38–126)
Anion gap: 11 (ref 5–15)
BUN: 14 mg/dL (ref 6–20)
CO2: 21 mmol/L — ABNORMAL LOW (ref 22–32)
Calcium: 6.9 mg/dL — ABNORMAL LOW (ref 8.9–10.3)
Chloride: 108 mmol/L (ref 98–111)
Creatinine, Ser: 0.96 mg/dL (ref 0.44–1.00)
GFR, Estimated: >60 mL/min (ref >60)
Glucose, Bld: 95 mg/dL (ref 70–99)
Potassium: 4.3 mmol/L (ref 3.5–5.1)
Sodium: 140 mmol/L (ref 135–145)
Total Bilirubin: 0.3 mg/dL (ref 0.3–1.2)
Total Protein: 5.4 g/dL — ABNORMAL LOW (ref 6.5–8.1)

## 2021-12-01 LAB — CBC
HCT: 34.1 % — ABNORMAL LOW (ref 36.0–46.0)
Hemoglobin: 10.6 g/dL — ABNORMAL LOW (ref 12.0–15.0)
MCH: 26.6 pg (ref 26.0–34.0)
MCHC: 31.1 g/dL (ref 30.0–36.0)
MCV: 85.5 fL (ref 80.0–100.0)
Platelets: 307 10*3/uL (ref 150–400)
RBC: 3.99 MIL/uL (ref 3.87–5.11)
RDW: 17.1 % — ABNORMAL HIGH (ref 11.5–15.5)
WBC: 15.7 10*3/uL — ABNORMAL HIGH (ref 4.0–10.5)
nRBC: 0.1 % (ref 0.0–0.2)

## 2021-12-01 LAB — MAGNESIUM: Magnesium: 5.4 mg/dL — ABNORMAL HIGH (ref 1.7–2.4)

## 2021-12-01 LAB — CULTURE, BETA STREP (GROUP B ONLY): Strep Gp B Culture: NEGATIVE

## 2021-12-01 MED ORDER — HYDROCORTISONE 1 % EX CREA
TOPICAL_CREAM | Freq: Three times a day (TID) | CUTANEOUS | Status: DC
  Administered 2021-12-01: 1 via TOPICAL
  Filled 2021-12-01: qty 28

## 2021-12-01 MED ORDER — NIFEDIPINE ER OSMOTIC RELEASE 60 MG PO TB24
60.0000 mg | ORAL_TABLET | Freq: Every day | ORAL | Status: DC
  Administered 2021-12-01: 60 mg via ORAL
  Filled 2021-12-01: qty 1

## 2021-12-01 NOTE — Progress Notes (Signed)
Post Partum Day 1 Subjective: Sleeping deeply. I spoke with her mom - she had not said anything to her mom about chest pressure except when she walks but mom reports patient does have the pericardial effusion. She noted to mom her heaviness in legs when she ambulates. Otherwise patient has been not sleeping much over worry for the baby (in room, doing well).   Objective: Blood pressure (!) 143/107, pulse 91, temperature 98 F (36.7 C), temperature source Oral, resp. rate 18, height 5\' 6"  (1.676 m), weight 77.9 kg, last menstrual period 03/21/2021, SpO2 99 %.  Physical Exam:  General:  Sleeping deeply Pt not examined to allow for more sleep  Recent Labs    11/30/21 0806 12/01/21 0415  HGB 11.3* 10.6*  HCT 35.2* 34.1*    Assessment/Plan: Postpartum - Contraception: outpt Paragard - MOF: Breast - Rh status: Positive - Rubella status: Immune - Dispo: Likely tomorrow on PPD2 - Consults: Lactation  Preeclampsia with SF - Will increase procardia to 60 daily - Continue lasix which may also help the pericardia effusion.  - Will see if chest pressure and leg heaviness improves off the Magnesium once patient is awake. She is s/p Mag at 0600.  - Creatinine improved to 0.96  Pericardial effusion - Will need follow up after delivery with OB Cards.   Neonatal - Doing well    LOS: 3 days   Radene Gunning 12/01/2021, 7:46 AM

## 2021-12-01 NOTE — Lactation Note (Signed)
This note was copied from a baby's chart. Lactation Consultation Note  Patient Name: Girl Corrinna Karapetyan [id number] Date: 12/01/2021 Reason for consult: Follow-up assessment;Primapara;1st time breastfeeding;Infant < 6lbs;Infant weight loss;Breastfeeding assistance (6.34% WL) Age:22 hours  P1 Birth parent; Infant at 6.34% WL; Infant <6lbs  LC entered the room and the infant was STS with the birth parent.  Per the birth parent, she was making more when she started pumping initially, but she only made 36mL when she pumped last night.  LC encouraged the birth parent and praised her for her efforts.  LC spoke with the birth parent about milk product in the early days of life.  The birth parent stated that she has been pumping every 3 hours, but skipped some pumping sessions last night.  She said that she was tired and discouraged.  LC spoke with the birth parent about the importance of sleeping and stimulating the breasts.  The birth parent stated that she will continue to pump.  All questions were answered.  The birth parent was encouraged to call the [place] for assistance with breastfeeding.   Maternal Data    Feeding Mother's Current Feeding Choice: Breast Milk and Formula Nipple Type: Extra Slow Flow  LATCH Score                    Lactation Tools Discussed/Used    Interventions Interventions: Breast feeding basics reviewed;Education  Discharge    Consult Status Consult Status: Follow-up Date: 12/02/21 Follow-up type: In-patient    Lysbeth Penner 12/01/2021, 8:44 AM

## 2021-12-02 ENCOUNTER — Ambulatory Visit: Payer: Self-pay

## 2021-12-02 MED ORDER — ACETAMINOPHEN 325 MG PO TABS: 650.0000 mg | ORAL_TABLET | Freq: Four times a day (QID) | ORAL | Status: DC | PRN

## 2021-12-02 MED ORDER — NIFEDIPINE ER OSMOTIC RELEASE 60 MG PO TB24
90.0000 mg | ORAL_TABLET | Freq: Every day | ORAL | Status: DC
  Administered 2021-12-02: 90 mg via ORAL
  Filled 2021-12-02: qty 1

## 2021-12-02 MED ORDER — IBUPROFEN 600 MG PO TABS: 600.0000 mg | ORAL_TABLET | Freq: Four times a day (QID) | ORAL | 0 refills | Status: DC

## 2021-12-02 MED ORDER — NIFEDIPINE ER OSMOTIC RELEASE 90 MG PO TB24: 90.0000 mg | ORAL_TABLET | Freq: Every day | ORAL | 0 refills | Status: DC

## 2021-12-02 MED ORDER — FUROSEMIDE 20 MG PO TABS: 20.0000 mg | ORAL_TABLET | Freq: Two times a day (BID) | ORAL | 0 refills | Status: DC

## 2021-12-02 NOTE — Lactation Note (Signed)
This note was copied from a baby's chart. Lactation Consultation Note  Patient Name: Sarah Campbell CWCBJ'S Date: 12/02/2021   Age:22 hours   LC Note:   Attempted to visit with mother, however, she is asleep at this time.  Asked visitor to call when she awakens and is ready for a lactation return visit.   Maternal Data    Feeding Nipple Type: Extra Slow Flow  LATCH Score                    Lactation Tools Discussed/Used    Interventions    Discharge    Consult Status      Darrien Laakso R Dave Mergen 12/02/2021, 9:04 AM

## 2021-12-02 NOTE — Lactation Note (Signed)
This note was copied from a baby's chart. Lactation Consultation Note  Patient Name: Sarah Campbell BWGYK'[id number] Date: 12/02/2021 Reason for consult: Follow-up assessment;Late-preterm 34-36.6wks;Primapara;1st time breastfeeding;Infant < 6lbs Age:22 hours   P1: Late preterm infant at 36+2 weeks with a CGA of 36+4 weeks weighing < 5 lbs Feeding preference: Breast milk/formula Weight loss: 6%  Mother is not latching to the breast at this time.    Grandmother was formula feeding "[name]" when I arrived; tolerating feeding well.  Encouraged family to feed a minimum of 20 mls every three hours and more as tolerated, preferably a goal of 30 mls every three hours.  Mother reported that she is now starting to take more volume.  Mother has not been consistent with her pumping and I encouraged to get back to pumping every three hours when "[name]" feeds.  She has been obtaining volumes of 3-10 mls/pumping session.  Encouraged to continue breast massage and hand expression to help ensure a good milk supply.  Baby has had multiple voids/stools  Observed mother pumping using the #24 flanges which are appropriate at this time.  Asked RN to call the pediatrician to obtain coconut oil since mother has been discharged.  Discussed using coconut oil for nipple care and flange lubrication.  Allowed time for questions.  Mother and grandmother with no further concerns.  Encouraged to call as needed.   Maternal Data Has patient been taught Hand Expression?: Yes Does the patient have breastfeeding experience prior to this delivery?: No  Feeding Mother's Current Feeding Choice: Breast Milk and Formula Nipple Type: Extra Slow Flow  LATCH Score                    Lactation Tools Discussed/Used Tools: Pump;Flanges;Coconut oil (Asked RN to call pediatrician for a coconut oil order (mother has been discharged and this is a baby patient)) Flange Size: 24 Breast pump type: Double-Electric Breast  Pump;Manual Pump Education: Setup, frequency, and cleaning;Milk Storage (Reviewed) Reason for Pumping: Breast stimulation for supplementation Pumping frequency: Every three hours Pumped volume:  (3-10 mls/session)  Interventions Interventions: Education;DEBP;Coconut oil  Discharge Pump: Hands Free (Unsure of brand but it is a wireless pump)  Consult Status Consult Status: Follow-up Date: 12/03/21 Follow-up type: In-patient    Little Ishikawa 12/02/2021, 3:33 PM

## 2021-12-03 ENCOUNTER — Ambulatory Visit: Payer: Self-pay

## 2021-12-03 NOTE — Lactation Note (Signed)
This note was copied from a baby's chart. Lactation Consultation Note  Patient Name: Sarah Campbell GEXBM'[id number] Date: 12/03/2021 Reason for consult: Follow-up assessment;Primapara;1st time breastfeeding;Late-preterm 34-36.6wks;Infant < 6lbs;Hyperbilirubinemia Age:22 hours  Baby appear jaundiced, TcB this am 12.4 at 70 hrs which is below light level.  LC in to visit with P1 Mom of LPTI on day of possible discharge.  Baby is at a 5% weight loss and consuming EBM followed by 22 calorie formula by paced bottle.  Mom was given powdered Neosure,  Mom is expressing every 3 hrs using the Medela Symphony DEBP.  Mom has [facility] in [place].  [facility] faxed [place] referral.  Mom has only a hand's free Elvie pump.  Talked about the benefit of a hospital grade pump with regards to building a milk supply.  Mom is interested in OP lactation F/U.  Mom desires to exclusively breastfeed her baby.  Encouraged STS with baby and double pumping every 2-3 hrs during the day and 3-4 hrs at night.  Engorgement prevention and treatment reviewed.   Lactation Tools Discussed/Used Tools: Pump;Flanges Flange Size: 24 Pump Education: Setup, frequency, and cleaning;Milk Storage Pumping frequency: Q 3hrs Pumped volume: 10 mL  Interventions Interventions: Breast feeding basics reviewed;Skin to skin;Breast massage;Hand express;Hand pump;DEBP;Education;Pace feeding  Discharge Discharge Education: Engorgement and breast care;Warning signs for feeding baby;Outpatient recommendation;Outpatient Epic message sent Pump: Hands Free;Advised to call insurance company;Refer for rental [facility]: Yes  Consult Status Consult Status: Complete Date: 12/03/21 Follow-up type: [place] 12/03/2021, 1:08 PM

## 2021-12-04 ENCOUNTER — Other Ambulatory Visit: Payer: Medicaid Other

## 2021-12-04 ENCOUNTER — Encounter: Payer: Medicaid Other | Admitting: Obstetrics & Gynecology

## 2021-12-04 ENCOUNTER — Ambulatory Visit: Payer: Medicaid Other

## 2021-12-04 LAB — SURGICAL PATHOLOGY

## 2021-12-05 ENCOUNTER — Inpatient Hospital Stay (HOSPITAL_COMMUNITY): Payer: 59

## 2021-12-05 ENCOUNTER — Inpatient Hospital Stay (HOSPITAL_COMMUNITY): Admission: AD | Admit: 2021-12-05 | Payer: 59 | Source: Home / Self Care | Admitting: Obstetrics & Gynecology

## 2021-12-06 ENCOUNTER — Encounter: Payer: Self-pay | Admitting: *Deleted

## 2021-12-07 ENCOUNTER — Other Ambulatory Visit: Payer: Medicaid Other

## 2021-12-07 ENCOUNTER — Ambulatory Visit: Payer: 59

## 2021-12-07 ENCOUNTER — Telehealth (INDEPENDENT_AMBULATORY_CARE_PROVIDER_SITE_OTHER): Payer: 59 | Admitting: *Deleted

## 2021-12-07 VITALS — BP 155/107 | HR 112

## 2021-12-07 DIAGNOSIS — O165 Unspecified maternal hypertension, complicating the puerperium: Secondary | ICD-10-CM

## 2021-12-07 NOTE — Progress Notes (Signed)
   NURSE VISIT- BLOOD PRESSURE CHECK  I connected withNAME@ on 12/07/2021 by MyChart video and verified that I am speaking with the correct person using two identifiers.   I discussed the limitations of evaluation and management by telemedicine. The patient expressed understanding and agreed to proceed.  Nurse is at the office, and patient is at home.  SUBJECTIVE:  Sarah Campbell is a 21 y.o. G81P0010 female here for BP check. She is postpartum, delivery date 11/30/21     HYPERTENSION ROS:  Postpartum:  Severe headaches that don't go away with tylenol/other medicines: No  Visual changes (seeing spots/double/blurred vision) No  Severe pain under right breast breast or in center of upper chest  having some chest pressure but is not worse that when she was in the hospital Severe nausea/vomiting Yes  Taking medicines as instructed yes -OBJECTIVE:  BP (!) 155/107 (BP Location: Right Arm, Patient Position: Sitting, Cuff Size: Normal)   Pulse (!) 112   Breastfeeding Yes   Appearance alert, well appearing, and in no distress.  ASSESSMENT: Postpartum  blood pressure check  PLAN: Discussed with Dr. Despina Hidden   Recommendations:  start taking Labetalol 200mg  TID in addition to Procardia 90mg     Follow-up:  Monday for BP check with Nurse     12/07/2021 12:03 PM

## 2021-12-10 ENCOUNTER — Encounter: Payer: Self-pay | Admitting: Advanced Practice Midwife

## 2021-12-10 ENCOUNTER — Telehealth (INDEPENDENT_AMBULATORY_CARE_PROVIDER_SITE_OTHER): Payer: 59 | Admitting: *Deleted

## 2021-12-10 VITALS — BP 146/96 | HR 97 | Ht 66.0 in

## 2021-12-10 DIAGNOSIS — Z0130 Encounter for examination of blood pressure without abnormal findings: Secondary | ICD-10-CM

## 2021-12-10 DIAGNOSIS — O165 Unspecified maternal hypertension, complicating the puerperium: Secondary | ICD-10-CM

## 2021-12-10 NOTE — Progress Notes (Addendum)
   NURSE VISIT- BLOOD PRESSURE CHECK  I connected with Mieka Dosh @ on 12/10/2021 by telephone  and verified that I am speaking with the correct person using two identifiers.   I discussed the limitations of evaluation and management by telemedicine. The patient expressed understanding and agreed to proceed.  Nurse is at the office, and patient is at home.  SUBJECTIVE:  Sarah Campbell is a 21 y.o. [phone] female here for BP check. She is postpartum, delivery date 11/30/21.     HYPERTENSION ROS:  Pregnant/postpartum:  Severe headaches that don't go away with tylenol/other medicines: No  Visual changes (seeing spots/double/blurred vision) No  Severe pain under right breast breast or in center of upper chest No  Severe nausea/vomiting No  Taking medicines as instructed yes    OBJECTIVE:  BP (!) 146/96 (BP Location: Right Arm)   Pulse 97   Ht 5\' 6"  (1.676 m)   Breastfeeding Yes   BMI 27.73 kg/m  BP after resting was 146/96.  Appearance alert, well appearing, and in no distress.  ASSESSMENT: Postpartum  blood pressure check  PLAN: Discussed with , CNM, [facility]   Recommendations:  [facility] will review chart and change med.     Follow-up:  pp visit in 4-6 weeks.    Selena Batten  12/10/2021 3:23 PM  12/12/2021 out of office.. Increase labetalol to 300mg  TID.  Recheck Thursday (message sent to [facility])

## 2021-12-11 ENCOUNTER — Other Ambulatory Visit: Payer: Medicaid Other

## 2021-12-11 ENCOUNTER — Ambulatory Visit: Payer: 59

## 2021-12-11 ENCOUNTER — Encounter: Payer: Medicaid Other | Admitting: Obstetrics & Gynecology

## 2021-12-12 ENCOUNTER — Telehealth (HOSPITAL_COMMUNITY): Payer: Self-pay | Admitting: *Deleted

## 2021-12-12 NOTE — Telephone Encounter (Signed)
Mom reports feeling good. No concerns about herself at this time. EPDS=3 [facility] score=0) Mom reports baby is doing well. Feeding, peeing, and pooping without difficulty. Safe sleep reviewed. Mom reports no concerns about baby at present.  Duffy Rhody, RN 12-12-2021 at 3:00pm

## 2021-12-13 ENCOUNTER — Telehealth (INDEPENDENT_AMBULATORY_CARE_PROVIDER_SITE_OTHER): Payer: 59 | Admitting: *Deleted

## 2021-12-13 VITALS — BP 128/83 | HR 112

## 2021-12-13 DIAGNOSIS — O165 Unspecified maternal hypertension, complicating the puerperium: Secondary | ICD-10-CM

## 2021-12-13 DIAGNOSIS — Z0130 Encounter for examination of blood pressure without abnormal findings: Secondary | ICD-10-CM

## 2021-12-13 NOTE — Progress Notes (Addendum)
   NURSE VISIT- BLOOD PRESSURE CHECK  I connected withNAME@ on 12/13/2021 by telephone  and verified that I am speaking with the correct person using two identifiers.   I discussed the limitations of evaluation and management by telemedicine. The patient expressed understanding and agreed to proceed.  Nurse is at the office, and patient is at home.  SUBJECTIVE:  Sarah Campbell is a 21 y.o. [phone] female here for BP check. She is postpartum, delivery date 11/30/21     HYPERTENSION ROS:  Postpartum:  Severe headaches that don't go away with tylenol/other medicines: No  Visual changes (seeing spots/double/blurred vision) No  Severe pain under right breast breast or in center of upper chest No  Severe nausea/vomiting No  Taking medicines as instructed yes  OBJECTIVE:  BP 128/83 (BP Location: Right Arm, Patient Position: Sitting, Cuff Size: Normal)   Pulse (!) 112   Breastfeeding Yes   Appearance oriented to person, place, and time.  ASSESSMENT: Postpartum  blood pressure check  PLAN: Discussed with Cathie Beams, CNM   Recommendations: stop medication 2 days prior to postpartum appt Follow-up: as scheduled   Jobe Marker  12/13/2021 1:50 PM Chart reviewed for nurse visit. Agree with plan of care.  Jacklyn Shell, [place] 12/13/2021 3:55 PM

## 2021-12-14 ENCOUNTER — Other Ambulatory Visit: Payer: Medicaid Other

## 2021-12-18 ENCOUNTER — Encounter (HOSPITAL_COMMUNITY): Payer: Self-pay | Admitting: Obstetrics and Gynecology

## 2021-12-18 ENCOUNTER — Encounter: Payer: Medicaid Other | Admitting: Women's Health

## 2021-12-18 ENCOUNTER — Other Ambulatory Visit: Payer: Medicaid Other

## 2021-12-25 ENCOUNTER — Other Ambulatory Visit: Payer: Medicaid Other

## 2021-12-25 ENCOUNTER — Encounter: Payer: Medicaid Other | Admitting: Women's Health

## 2022-01-02 ENCOUNTER — Encounter: Payer: Self-pay | Admitting: Advanced Practice Midwife

## 2022-01-02 ENCOUNTER — Ambulatory Visit (INDEPENDENT_AMBULATORY_CARE_PROVIDER_SITE_OTHER): Payer: 59 | Admitting: Advanced Practice Midwife

## 2022-01-02 NOTE — Progress Notes (Signed)
POSTPARTUM VISIT Patient name: Sarah Campbell MRN [id number]  Date of birth: 02-08-2000 Chief Complaint:   Postpartum Care  History of Present Illness:   Sarah Campbell is a 21 y.o. G54P0111 Caucasian female being seen today for a postpartum visit. She is 5 weeks postpartum following a spontaneous vaginal delivery at 36.2 gestational weeks. IOL: yes, for severe pre-e (BPs); also with FGR. Anesthesia: epidural.  Laceration: 1st degree.  Complications: required mag sulfate during labor and 24h PP. Inpatient contraception: no.   Pregnancy complicated by severe pre-e . Tobacco use: yes- ecigarettes. Substance use disorder: no. Last pap smear: May 2022 and results were NILM w/ HRHPV not done. Next pap smear due: May 2025 No LMP recorded.  Postpartum course has been complicated by taking Procardia/Lasix PP but otherwise doing well . Bleeding spotting. Bowel function is  normal w Colace . Bladder function is normal. Urinary incontinence? no, fecal incontinence? no Patient is not sexually active. Last sexual activity: prior to birth of baby. Desired contraception: IUD- Paragard. Patient does not know about a pregnancy in the future.  Desired family size is unsure number of children.   Upstream - 01/02/22 9407       Pregnancy Intention Screening   Does the patient want to become pregnant in the next year? No    Does the patient's partner want to become pregnant in the next year? No    Would the patient like to discuss contraceptive options today? Yes      Contraception Wrap Up   Current Method Abstinence            The pregnancy intention screening data noted above was reviewed. Potential methods of contraception were discussed. The patient elected to proceed with No data recorded.  Edinburgh Postpartum Depression Screening: negative  Edinburgh Postnatal Depression Scale - 01/02/22 0944       Edinburgh Postnatal Depression Scale:  In the Past 7 Days   I have been able to laugh and see  the funny side of things. 0    I have looked forward with enjoyment to things. 0    I have blamed myself unnecessarily when things went wrong. 1    I have been anxious or worried for no good reason. 2    I have felt scared or panicky for no good reason. 0    Things have been getting on top of me. 1    I have been so unhappy that I have had difficulty sleeping. 0    I have felt sad or miserable. 0    I have been so unhappy that I have been crying. 0    The thought of harming myself has occurred to me. 0    Edinburgh Postnatal Depression Scale Total 4                09/20/2021    9:37 AM 06/18/2021    2:30 PM 05/30/2020   10:43 AM  GAD 7 : Generalized Anxiety Score  Nervous, Anxious, on Edge 0 0 0  Control/stop worrying 0 0 0  Worry too much - different things 0 0 0  Trouble relaxing 0 0 1  Restless 0 0 0  Easily annoyed or irritable 0 0 0  Afraid - awful might happen 0 0 0  Total GAD 7 Score 0 0 1     Baby's course has been uncomplicated. Baby is feeding by breast: milk supply adequate. Infant has a pediatrician/family doctor? Yes.  Childcare strategy  if returning to work/school: family.  Pt has material needs met for her and baby: Yes.   Review of Systems:   Pertinent items are noted in HPI Denies Abnormal vaginal discharge w/ itching/odor/irritation, headaches, visual changes, shortness of breath, chest pain, abdominal pain, severe nausea/vomiting, or problems with urination or bowel movements. Pertinent History Reviewed:  Reviewed past medical,surgical, obstetrical and family history.  Reviewed problem list, medications and allergies. OB History  Gravida Para Term Preterm AB Living  2 1 0 _0 SAB IAB Ectopic Multiple Live Births  1 0 0 0 1    # Outcome Date GA Lbr Len/2nd Weight Sex Delivery Anes PTL Lv  2 Preterm 11/30/21 47w2d01:08 / 00:22 4 lb 10 oz (2.098 kg) F Vag-Spont EPI  LIV  1 SAB            Physical Assessment:   Vitals:   01/02/22 0939 01/02/22 0942   BP: (!) 125/91 (!) 125/94  Pulse: 85 92  Weight: 143 lb (64.9 kg)   Height: _1  (1.676 m)   Body mass index is 23.08 kg/m.       Physical Examination:   General appearance: alert, well appearing, and in no distress  Mental status: alert, oriented to person, place, and time  Skin: warm & dry   Cardiovascular: normal heart rate noted   Respiratory: normal respiratory effort, no distress   Breasts: deferred, no complaints   Abdomen: soft, non-tender   Pelvic: normal external genitalia, vulva, vagina, cervix, uterus and adnexa. Thin prep pap obtained: No  Rectal: not examined  Extremities: Edema: none         No results found for this or any previous visit (from the past 24 hour(s)).  Assessment & Plan:  1) Postpartum exam 2) Five wks s/p spontaneous vaginal delivery (preterm- 36.2wks) 3) breast feeding 4) Depression screening: mild, EPDS 4 5) Contraception counseling: desires Paragard; info regarding crampier/heavier cycles was reviewed; no sex until after insertion 6) s/p severe pre-e: BP still slightly elevated but is only 5wks PP; rec see PCP Dr ZWende Neighborssoon in order to be followed for this  Essential components of care per ACOG recommendations:  1.  Mood and well being:  If positive depression screen, discussed and plan developed.  If using tobacco we discussed reduction/cessation and risk of relapse If current substance abuse, we discussed and referral to local resources was offered.   2. Infant care and feeding:  If breastfeeding, discussed returning to work, pumping, breastfeeding-associated pain, guidance regarding return to fertility while lactating if not using another method. If needed, patient was provided with a letter to be allowed to pump q 2-3hrs to support lactation in a private location with access to a refrigerator to store breastmilk.   Recommended that all caregivers be immunized for flu, pertussis and other preventable communicable diseases If pt does  not have material needs met for her/baby, referred to local resources for help obtaining these.  3. Sexuality, contraception and birth spacing Provided guidance regarding sexuality, management of dyspareunia, and resumption of intercourse Discussed avoiding interpregnancy interval <691ms and recommended birth spacing of 18 months  4. Sleep and fatigue Discussed coping options for fatigue and sleep disruption Encouraged family/partner/community support of 4 hrs of uninterrupted sleep to help with mood and fatigue  5. Physical recovery  If pt had a C/S, assessed incisional pain and providing guidance on normal vs prolonged recovery If pt had a laceration, perineal healing and pain reviewed.  If urinary or fecal incontinence, discussed management and referred to PT or uro/gyn if indicated  Patient is safe to resume physical activity. Discussed attainment of healthy weight.  6.  Chronic disease management Discussed pregnancy complications if any, and their implications for future childbearing and long-term maternal health. Review recommendations for prevention of recurrent pregnancy complications, such as 17 hydroxyprogesterone caproate to reduce risk for recurrent PTB not applicable, or aspirin to reduce risk of preeclampsia yes. Pt had GDM: no. If yes, 2hr GTT scheduled: not applicable. Reviewed medications and non-pregnant dosing including consideration of whether pt is breastfeeding using a reliable resource such as LactMed: yes Referred for f/u w/ PCP or subspecialist providers as indicated: strongly encouraged to make appt with new PCP she has been assigned to- Dr Wende Neighbors  7. Health maintenance Mammogram at 36yo or earlier if indicated Pap smears as indicated  Meds: No orders of the defined types were placed in this encounter.   Follow-up: Return for Paragard insertion- first avail.   No orders of the defined types were placed in this encounter.   Myrtis Ser  CNM 01/02/2022 10:22 AM

## 2022-01-02 NOTE — Patient Instructions (Signed)
Resources for postpartum depression and medication safety with breastfeeding:  [email or web address]  [email or web address].net

## 2022-01-07 ENCOUNTER — Ambulatory Visit: Payer: 59 | Admitting: Advanced Practice Midwife

## 2022-01-08 ENCOUNTER — Other Ambulatory Visit (HOSPITAL_COMMUNITY): Payer: 59

## 2022-02-04 ENCOUNTER — Encounter (HOSPITAL_COMMUNITY): Payer: Self-pay | Admitting: Cardiology

## 2022-02-04 ENCOUNTER — Other Ambulatory Visit (HOSPITAL_COMMUNITY): Payer: 59

## 2022-03-20 ENCOUNTER — Telehealth: Payer: Self-pay | Admitting: Adult Health

## 2022-03-20 NOTE — Telephone Encounter (Signed)
Patient called wanting to see if a prescription could be sent in for a uti. Please advise.

## 2022-03-22 ENCOUNTER — Other Ambulatory Visit: Payer: 59 | Admitting: *Deleted

## 2022-03-22 ENCOUNTER — Other Ambulatory Visit (HOSPITAL_COMMUNITY)
Admission: RE | Admit: 2022-03-22 | Discharge: 2022-03-22 | Disposition: A | Discharge status: Home / Self Care | Payer: 59 | Source: Ambulatory Visit | Attending: Obstetrics & Gynecology | Admitting: Obstetrics & Gynecology

## 2022-03-22 DIAGNOSIS — R300 Dysuria: Secondary | ICD-10-CM

## 2022-03-22 DIAGNOSIS — N898 Other specified noninflammatory disorders of vagina: Secondary | ICD-10-CM | POA: Insufficient documentation

## 2022-03-22 NOTE — Progress Notes (Signed)
   NURSE VISIT- UTI SYMPTOMS   SUBJECTIVE:  Sarah Campbell is a 22 y.o. [phone] female here for UTI symptoms. She is a GYN patient. She reports dysuria, urinary frequency, urinary hesitancy, and urinary urgency.  OBJECTIVE:  There were no vitals taken for this visit.  Appears well, in no apparent distress  No results found for this or any previous visit (from the past 24 hour(s)).  ASSESSMENT: GYN patient with UTI symptoms and negative nitrites  PLAN: Note routed to Derrek Monaco, AGNP   Rx sent by provider today: No Urine culture sent Call or return to clinic prn if these symptoms worsen or fail to improve as anticipated. Follow-up: as needed   Also sent swab for bv yeast gonorrhea chlamydia and trich  Pt currently breastfeeding.   [name]  03/22/2022 12:10 PM

## 2022-03-23 LAB — URINALYSIS
Bilirubin, UA: NEGATIVE
Glucose, UA: NEGATIVE
Ketones, UA: NEGATIVE
Leukocytes,UA: NEGATIVE
Nitrite, UA: NEGATIVE
RBC, UA: NEGATIVE
Specific Gravity, UA: 1.016 (ref 1.005–1.030)
Urobilinogen, Ur: 1 mg/dL (ref 0.2–1.0)
pH, UA: 7.5 (ref 5.0–7.5)

## 2022-03-25 LAB — CERVICOVAGINAL ANCILLARY ONLY
Bacterial Vaginitis (gardnerella): NEGATIVE
Candida Glabrata: NEGATIVE
Candida Vaginitis: POSITIVE — AB
Chlamydia: NEGATIVE
Comment: NEGATIVE
Comment: NEGATIVE
Comment: NEGATIVE
Comment: NEGATIVE
Comment: NEGATIVE
Comment: NORMAL
Neisseria Gonorrhea: NEGATIVE
Trichomonas: NEGATIVE

## 2022-03-25 LAB — URINE CULTURE

## 2022-03-28 ENCOUNTER — Encounter: Payer: Self-pay | Admitting: *Deleted

## 2022-09-08 ENCOUNTER — Ambulatory Visit
Admission: EM | Admit: 2022-09-08 | Discharge: 2022-09-08 | Disposition: A | Discharge status: Home / Self Care | Payer: Medicaid Other | Attending: Family Medicine | Admitting: Family Medicine

## 2022-09-08 DIAGNOSIS — J0100 Acute maxillary sinusitis, unspecified: Secondary | ICD-10-CM

## 2022-09-08 LAB — POCT RAPID STREP A (OFFICE): Rapid Strep A Screen: NEGATIVE

## 2022-09-08 MED ORDER — PREDNISONE 20 MG PO TABS: 40.0000 mg | ORAL_TABLET | Freq: Every day | ORAL | 0 refills | Status: DC

## 2022-09-08 MED ORDER — FLUTICASONE PROPIONATE 50 MCG/ACT NA SUSP: 1.0000 | Freq: Two times a day (BID) | NASAL | 2 refills | Status: DC

## 2022-09-08 MED ORDER — AMOXICILLIN-POT CLAVULANATE 875-125 MG PO TABS: 1.0000 | ORAL_TABLET | Freq: Two times a day (BID) | ORAL | 0 refills | Status: DC

## 2022-09-08 NOTE — ED Provider Notes (Signed)
[facility]    CSN: [id number] Arrival date & time: 09/08/22  8657      History   Chief Complaint No chief complaint on file.   HPI Sarah Campbell is a 22 y.o. female.   Patient presenting today with about a week of progressively worsening nasal congestion, facial pain and pressure, body aches, cough, bilateral ear pain, nosebleeds.  Denies fever, chest pain, shortness of breath, abdominal pain, nausea vomiting or diarrhea.  Trying DayQuil, NyQuil, Mucinex, nasal saline with no relief.  No known pertinent chronic medical problems per patient.    Past Medical History:  Diagnosis Date   Heart murmur    Preeclampsia    Pregnancy induced hypertension     Patient Active Problem List   Diagnosis Date Noted   History of severe pre-eclampsia 11/28/2021   History of poor fetal growth 11/06/2021   Vapes nicotine containing substance 06/18/2021    Past Surgical History:  Procedure Laterality Date   NO PAST SURGERIES      OB History     Gravida  2   Para  1   Term  0   Preterm  1   AB  1   Living  1      SAB  1   IAB  0   Ectopic  0   Multiple  0   Live Births  1            Home Medications    Prior to Admission medications   Medication Sig Start Date End Date Taking? Authorizing Provider  amoxicillin-clavulanate (AUGMENTIN) 875-125 MG tablet Take 1 tablet by mouth every 12 (twelve) hours. 09/08/22  Yes Particia Nearing, PA-C  fluticasone [facility]) 50 MCG/ACT nasal spray Place 1 spray into both nostrils 2 (two) times daily. 09/08/22  Yes Particia Nearing, PA-C  predniSONE (DELTASONE) 20 MG tablet Take 2 tablets (40 mg total) by mouth daily with breakfast. 09/08/22  Yes Particia Nearing, PA-C  acetaminophen (TYLENOL) 325 MG tablet Take 2 tablets (650 mg total) by mouth every 6 (six) hours as needed (for pain scale < 4). 12/02/21   Myna Hidalgo, DO  docusate sodium (COLACE) 100 MG capsule Take 1 capsule (100 mg total) by  mouth 2 (two) times daily as needed for mild constipation. 08/20/21   Timberon Bing, MD  ondansetron (ZOFRAN-ODT) 4 MG disintegrating tablet Take 1 tablet (4 mg total) by mouth every 8 (eight) hours as needed for nausea or vomiting. 08/20/21   Benson Bing, MD  prenatal vitamin w/FE, FA (PRENATAL 1 + 1) 27-1 MG TABS tablet Take 1 tablet by mouth daily at 12 noon. 04/30/21   [name], NP  omeprazole (PRILOSEC) 20 MG capsule Take 1 capsule (20 mg total) by mouth 2 (two) times daily. 06/24/18 08/10/18  Rennis Harding, PA-C    Family History Family History  Problem Relation Age of Onset   Heart attack Paternal Grandmother    Diabetes Maternal Grandfather    Heart attack Maternal Grandfather    Stroke Maternal Grandfather    Alcoholism Father    Heart attack Maternal Aunt    Stroke Maternal Aunt     Social History Social History   Tobacco Use   Smoking status: Every Day    Types: E-cigarettes   Smokeless tobacco: Never  Vaping Use   Vaping status: Some Days   Substances: Nicotine, Flavoring  Substance Use Topics   Alcohol use: No   Drug use: No  Allergies   Patient has no known allergies.   Review of Systems Review of Systems Per HPI  Physical Exam Triage Vital Signs ED Triage Vitals [09/08/22 0825]  Encounter Vitals Group     BP 128/88     Systolic BP Percentile      Diastolic BP Percentile      Pulse Rate (!) 113     Resp 20     Temp 98.5 F (36.9 C)     Temp Source Oral     SpO2 98 %     Weight      Height      Head Circumference      Peak Flow      Pain Score 5     Pain Loc      Pain Education      Exclude from Growth Chart    No data found.  Updated Vital Signs BP 128/88 (BP Location: Right Arm)   Pulse (!) 113   Temp 98.5 F (36.9 C) (Oral)   Resp 20   LMP 08/18/2022 Comment: start  SpO2 98%   Breastfeeding No   Visual Acuity Right Eye Distance:   Left Eye Distance:   Bilateral Distance:    Right Eye Near:   Left Eye  Near:    Bilateral Near:     Physical Exam Vitals and nursing note reviewed.  Constitutional:      Appearance: Normal appearance.  HENT:     Head: Atraumatic.     Right Ear: Tympanic membrane and external ear normal.     Left Ear: Tympanic membrane and external ear normal.     Nose: Congestion present.     Mouth/Throat:     Mouth: Mucous membranes are moist.     Pharynx: Posterior oropharyngeal erythema present.  Eyes:     Extraocular Movements: Extraocular movements intact.     Conjunctiva/sclera: Conjunctivae normal.  Cardiovascular:     Rate and Rhythm: Normal rate and regular rhythm.     Heart sounds: Normal heart sounds.  Pulmonary:     Effort: Pulmonary effort is normal.     Breath sounds: Normal breath sounds. No wheezing or rales.  Musculoskeletal:        General: Normal range of motion.     Cervical back: Normal range of motion and neck supple.  Skin:    General: Skin is warm and dry.  Neurological:     Mental Status: She is alert and oriented to person, place, and time.  Psychiatric:        Mood and Affect: Mood normal.        Thought Content: Thought content normal.      UC Treatments / Results  Labs (all labs ordered are listed, but only abnormal results are displayed) Labs Reviewed  POCT RAPID STREP A (OFFICE)    EKG   Radiology No results found.  Procedures Procedures (including critical care time)  Medications Ordered in UC Medications - No data to display  Initial Impression / Assessment and Plan / UC Course  I have reviewed the triage vital signs and the nursing notes.  Pertinent labs & imaging results that were available during my care of the patient were reviewed by me and considered in my medical decision making (see chart for details).     Given duration and worsening course, treat with Augmentin, prednisone, Flonase, supportive over-the-counter medications and home care.  Return for worsening symptoms.  Work note given.  Final  Clinical Impressions(s) /  UC Diagnoses   Final diagnoses:  Acute non-recurrent maxillary sinusitis   Discharge Instructions   None    ED Prescriptions     Medication Sig Dispense Auth. Provider   predniSONE (DELTASONE) 20 MG tablet Take 2 tablets (40 mg total) by mouth daily with breakfast. 10 tablet Particia Nearing, PA-C   fluticasone [facility]) 50 MCG/ACT nasal spray Place 1 spray into both nostrils 2 (two) times daily. 16 g Particia Nearing, PA-C   amoxicillin-clavulanate (AUGMENTIN) 875-125 MG tablet Take 1 tablet by mouth every 12 (twelve) hours. 20 tablet Particia Nearing, [place]      PDMP not reviewed this encounter.   Roosvelt Maser [place], [place] 09/08/22 [phone]

## 2022-09-08 NOTE — ED Triage Notes (Signed)
Pt reports she has bilateral ear pain that is making her jaw hurt, facial pressure, sinus drainage, body aches, and cough x 1 week.

## 2022-10-10 ENCOUNTER — Emergency Department (HOSPITAL_COMMUNITY): Payer: Medicaid Other

## 2022-10-10 ENCOUNTER — Other Ambulatory Visit: Payer: Self-pay

## 2022-10-10 ENCOUNTER — Encounter (HOSPITAL_COMMUNITY): Payer: Self-pay

## 2022-10-10 ENCOUNTER — Emergency Department (HOSPITAL_COMMUNITY)
Admission: EM | Admit: 2022-10-10 | Discharge: 2022-10-10 | Disposition: A | Discharge status: Home / Self Care | Payer: Medicaid Other | Attending: Emergency Medicine | Admitting: Emergency Medicine

## 2022-10-10 DIAGNOSIS — E871 Hypo-osmolality and hyponatremia: Secondary | ICD-10-CM | POA: Diagnosis not present

## 2022-10-10 DIAGNOSIS — R0781 Pleurodynia: Secondary | ICD-10-CM | POA: Diagnosis not present

## 2022-10-10 DIAGNOSIS — Z1152 Encounter for screening for COVID-19: Secondary | ICD-10-CM | POA: Insufficient documentation

## 2022-10-10 DIAGNOSIS — R0602 Shortness of breath: Secondary | ICD-10-CM | POA: Insufficient documentation

## 2022-10-10 DIAGNOSIS — R079 Chest pain, unspecified: Secondary | ICD-10-CM | POA: Diagnosis present

## 2022-10-10 DIAGNOSIS — F17210 Nicotine dependence, cigarettes, uncomplicated: Secondary | ICD-10-CM | POA: Diagnosis not present

## 2022-10-10 DIAGNOSIS — E876 Hypokalemia: Secondary | ICD-10-CM | POA: Diagnosis not present

## 2022-10-10 DIAGNOSIS — R42 Dizziness and giddiness: Secondary | ICD-10-CM | POA: Diagnosis not present

## 2022-10-10 LAB — CBC
HCT: 37.2 % (ref 36.0–46.0)
Hemoglobin: 12 g/dL (ref 12.0–15.0)
MCH: 28 pg (ref 26.0–34.0)
MCHC: 32.3 g/dL (ref 30.0–36.0)
MCV: 86.7 fL (ref 80.0–100.0)
Platelets: 245 10*3/uL (ref 150–400)
RBC: 4.29 MIL/uL (ref 3.87–5.11)
RDW: 14.8 % (ref 11.5–15.5)
WBC: 6.5 10*3/uL (ref 4.0–10.5)
nRBC: 0 % (ref 0.0–0.2)

## 2022-10-10 LAB — BASIC METABOLIC PANEL
Anion gap: 7 (ref 5–15)
BUN: 13 mg/dL (ref 6–20)
CO2: 24 mmol/L (ref 22–32)
Calcium: 8.3 mg/dL — ABNORMAL LOW (ref 8.9–10.3)
Chloride: 101 mmol/L (ref 98–111)
Creatinine, Ser: 0.71 mg/dL (ref 0.44–1.00)
GFR, Estimated: >60 mL/min (ref >60)
Glucose, Bld: 84 mg/dL (ref 70–99)
Potassium: 3.3 mmol/L — ABNORMAL LOW (ref 3.5–5.1)
Sodium: 132 mmol/L — ABNORMAL LOW (ref 135–145)

## 2022-10-10 LAB — D-DIMER, QUANTITATIVE: D-Dimer, Quant: <0.27 ug{FEU}/mL (ref 0.00–0.50)

## 2022-10-10 LAB — SARS CORONAVIRUS 2 BY RT PCR: SARS Coronavirus 2 by RT PCR: NEGATIVE

## 2022-10-10 LAB — TROPONIN I (HIGH SENSITIVITY)
Troponin I (High Sensitivity): <2 ng/L (ref <18)
Troponin I (High Sensitivity): <2 ng/L (ref <18)

## 2022-10-10 LAB — PREGNANCY, URINE: Preg Test, Ur: NEGATIVE

## 2022-10-10 MED ORDER — IBUPROFEN 600 MG PO TABS: 600.0000 mg | ORAL_TABLET | Freq: Four times a day (QID) | ORAL | 0 refills | Status: DC | PRN

## 2022-10-10 MED ORDER — MECLIZINE HCL 25 MG PO TABS: 25.0000 mg | ORAL_TABLET | Freq: Three times a day (TID) | ORAL | 0 refills | Status: DC | PRN

## 2022-10-10 MED ORDER — KETOROLAC TROMETHAMINE 15 MG/ML IJ SOLN
15.0000 mg | Freq: Once | INTRAMUSCULAR | Status: AC
  Administered 2022-10-10: 15 mg via INTRAVENOUS
  Filled 2022-10-10: qty 1

## 2022-10-10 NOTE — Discharge Instructions (Addendum)
As discussed, workup today overall reassuring.  Your D-dimer was negative so low suspicion for blood clot in the lung.  Your heart enzymes were normal and EKG appeared normal.  Low suspicion for direct cardiac involvement.  Suspect your left-sided chest pain is likely secondary to pleurisy.  See information attached.  Will treat this with NSAIDs in the outpatient setting.  Regarding dizziness, I do think this is likely related to peripheral cause of vertigo given your history of similar symptoms in the past.  Will send a medication called meclizine to use as needed for feelings of dizziness.  Recommend follow-up with primary care in the outpatient setting for reevaluation.  Please do not hesitate to return to the emergency department for worrisome signs and symptoms we discussed become apparent.

## 2022-10-10 NOTE — ED Provider Notes (Signed)
[place] EMERGENCY DEPARTMENT AT [facility] Provider Note   CSN: [id number] Arrival date & time: 10/10/22  1115     History  Chief Complaint  Patient presents with   Shortness of Breath    Sarah Campbell is a 22 y.o. female.   Shortness of Breath   22 year old female presents emergency department with complaints of left-sided chest pain as well as shortness of breath as well as a few episodes of dizziness yesterday.  Regarding dizziness, described as room spinning sensation and worsened with positional changes of the head.  Patient reports history of similar symptoms in the past that have resolved spontaneously and states this feels the same.  Has not seen provider for the symptoms.  States that while at work when she was abruptly rotate her head will get up from a seated position, would begin to feel as if the room were spinning.  Symptoms seem to improve when she were sitting/laying still.  States she had a few episodes yesterday and none today.  States that while at work today, began to have sharp left-sided chest pain.  Chest pain worsened with taking deep breath as well as with coughing.  Denies history of similar symptoms in the past.  Denies any fever, sore throat, nasal congestion.  Denies any history of DVT/PE, recent surgery/immobilization, known malignancy, known coagulopathy, hormonal therapy.  States the pain has been persistent since symptom onset today.  Denies any blurred vision, double vision, difficulty speaking/swallowing, weakness/sensory deficits in upper extremities, slurred speech, facial droop, gait abnormality.  Past medical history significant for heart murmur  Home Medications Prior to Admission medications   Medication Sig Start Date End Date Taking? Authorizing Provider  ibuprofen (ADVIL) 600 MG tablet Take 1 tablet (600 mg total) by mouth every 6 (six) hours as needed. 10/10/22  Yes Sherian Maroon A, PA  meclizine (ANTIVERT) 25 MG tablet Take 1  tablet (25 mg total) by mouth 3 (three) times daily as needed for dizziness. 10/10/22  Yes Sherian Maroon A, PA  acetaminophen (TYLENOL) 325 MG tablet Take 2 tablets (650 mg total) by mouth every 6 (six) hours as needed (for pain scale < 4). 12/02/21   Myna Hidalgo, DO  amoxicillin-clavulanate (AUGMENTIN) 875-125 MG tablet Take 1 tablet by mouth every 12 (twelve) hours. 09/08/22   Particia Nearing, PA-C  docusate sodium (COLACE) 100 MG capsule Take 1 capsule (100 mg total) by mouth 2 (two) times daily as needed for mild constipation. 08/20/21   [place] Bing, MD  fluticasone (FLONASE) 50 MCG/ACT nasal spray Place 1 spray into both nostrils 2 (two) times daily. 09/08/22   Particia Nearing, PA-C  ondansetron (ZOFRAN-ODT) 4 MG disintegrating tablet Take 1 tablet (4 mg total) by mouth every 8 (eight) hours as needed for nausea or vomiting. 08/20/21   Hayden Bing, MD  predniSONE (DELTASONE) 20 MG tablet Take 2 tablets (40 mg total) by mouth daily with breakfast. 09/08/22   Particia Nearing, PA-C  prenatal vitamin w/FE, FA (PRENATAL 1 + 1) 27-1 MG TABS tablet Take 1 tablet by mouth daily at 12 noon. 04/30/21   [name], NP  omeprazole (PRILOSEC) 20 MG capsule Take 1 capsule (20 mg total) by mouth 2 (two) times daily. 06/24/18 08/10/18  Rennis Harding, PA-C      Allergies    Patient has no known allergies.    Review of Systems   Review of Systems  Respiratory:  Positive for shortness of breath.   All  other systems reviewed and are negative.   Physical Exam Updated Vital Signs BP 107/66   Pulse 85   Temp 99.3 F (37.4 C) (Oral)   Resp 20   Ht 5\' 6"  (1.676 m)   Wt 60.8 kg   SpO2 97%   BMI 21.63 kg/m  Physical Exam Vitals and nursing note reviewed.  Constitutional:      General: She is not in acute distress.    Appearance: She is well-developed.  HENT:     Head: Normocephalic and atraumatic.  Eyes:     Conjunctiva/sclera: Conjunctivae normal.   Cardiovascular:     Rate and Rhythm: Normal rate and regular rhythm.     Heart sounds: No murmur heard. Pulmonary:     Effort: Pulmonary effort is normal. No respiratory distress.     Breath sounds: Normal breath sounds. No wheezing, rhonchi or rales.     Comments: No chest wall tenderness to palpation. Chest:     Chest wall: No tenderness.  Abdominal:     Palpations: Abdomen is soft.     Tenderness: There is no abdominal tenderness. There is no guarding.  Musculoskeletal:        General: No swelling.     Cervical back: Neck supple.     Right lower leg: No edema.     Left lower leg: No edema.  Skin:    General: Skin is warm and dry.     Capillary Refill: Capillary refill takes less than 2 seconds.  Neurological:     Mental Status: She is alert.  Psychiatric:        Mood and Affect: Mood normal.     ED Results / Procedures / Treatments   Labs (all labs ordered are listed, but only abnormal results are displayed) Labs Reviewed  BASIC METABOLIC PANEL - Abnormal; Notable for the following components:      Result Value   Sodium 132 (*)    Potassium 3.3 (*)    Calcium 8.3 (*)    All other components within normal limits  SARS CORONAVIRUS 2 BY RT PCR  PREGNANCY, URINE  CBC  D-DIMER, QUANTITATIVE  TROPONIN I (HIGH SENSITIVITY)  TROPONIN I (HIGH SENSITIVITY)    EKG EKG Interpretation Date/Time:  Thursday October 10 2022 14:49:10 EDT Ventricular Rate:  74 PR Interval:  110 QRS Duration:  91 QT Interval:  382 QTC Calculation: 424 R Axis:   87  Text Interpretation: Sinus arrhythmia Borderline short PR interval No significant change since prior 7/23 Confirmed by Meridee Score [phone]) on 10/10/2022 2:59:36 PM  Radiology DG Chest 2 View  Result Date: 10/10/2022 CLINICAL DATA:  Shortness of breath EXAM: CHEST - 2 VIEW COMPARISON:  X-ray 06/30/2020 FINDINGS: The heart size and mediastinal contours are within normal limits. No consolidation, pneumothorax or effusion. No  edema. The visualized skeletal structures are unremarkable. IMPRESSION: No acute cardiopulmonary disease. Electronically Signed   By: Karen Kays M.D.   On: 10/10/2022 13:04    Procedures Procedures    Medications Ordered in ED Medications  ketorolac (TORADOL) 15 MG/ML injection 15 mg (15 mg Intravenous Given 10/10/22 1500)    ED Course/ Medical Decision Making/ A&P                                 Medical Decision Making Amount and/or Complexity of Data Reviewed Labs: ordered. Radiology: ordered.  Risk Prescription drug management.   This patient presents to  the ED for concern of shortness of breath, chest pain, this involves an extensive number of treatment options, and is a complaint that carries with it a high risk of complications and morbidity.  The differential diagnosis includes The causes for shortness of breath include but are not limited to Cardiac (AHF, pericardial effusion and tamponade, arrhythmias, ischemia, etc) Respiratory (COPD, asthma, pneumonia, pneumothorax, primary pulmonary hypertension, PE/VQ mismatch) Hematological (anemia)  Co morbidities that complicate the patient evaluation  See HPI   Additional history obtained:  Additional history obtained from EMR External records from outside source obtained and reviewed including hospital records   Lab Tests:  I Ordered, and personally interpreted labs.  The pertinent results include: No leukocytosis.  No evidence of anemia.  Placed within range.  Mild hypokalemia 3.3 as well as hypocalcemia of 8.3 and hyponatremia 132.  No renal dysfunction.  D-dimer negative.  COVID-negative.  Urine pregnancy negative.   Imaging Studies ordered:  I ordered imaging studies including chest x-ray I independently visualized and interpreted imaging which showed no acute cardiopulmonary abnormalities I agree with the radiologist interpretation   Cardiac Monitoring: / EKG:  The patient was maintained on a cardiac  monitor.  I personally viewed and interpreted the cardiac monitored which showed an underlying rhythm of: Sinus arrhythmia with borderline PR interval   Consultations Obtained:  N/a   Problem List / ED Course / Critical interventions / Medication management  Pleuritic chest pain I ordered medication including Toradol   Reevaluation of the patient after these medicines showed that the patient improved I have reviewed the patients home medicines and have made adjustments as needed   Social Determinants of Health:  Chronic e-cigarette use.  Denies illicit substance use.   Test / Admission - Considered:  Pleuritic chest pain Vitals signs within normal range and stable throughout visit. Laboratory/imaging studies significant for: See above 22 year old female presents emergency department with complaints of left-sided chest pain, shortness of breath and a few different dizzy episodes.  Regarding dizziness, symptoms seem to be isolated to yesterday.  Symptoms were experienced when changes position of the head were made as well as when standing up abruptly.  Dizziness described as room spinning type sensation without near syncopal sensation.  Patient with history of similar symptoms in the past.  Neuroexam nonfocal with no current episodes of dizziness.  Unable to reproduce sensation on exam.  Suspect patient's symptoms are likely secondary to peripheral cause of vertigo.  Will recommend meclizine as needed in the future if recurrence occurs and follow-up with ENT in the outpatient setting.  Regarding left-sided chest pain/shortness of breath, symptoms began today.  Chest pain described as pleuritic in nature and nonreproducible with palpation of the chest.  Patient with dorsal negative troponin, lack of acute ischemic changes on EKG.  Patient with heart score of 0-3.  Doubt ACS.  Patient negative D-dimer.  Doubt PE.  Patient without evidence of pneumonia, pneumothorax or other abnormality  appreciated on x-ray imaging.  Suspect patient's symptoms likely secondary to pleurisy versus musculoskeletal etiology.  She did note improvement of symptoms after NSAID was administered on emergency department.  Recommend continues with the same in the outpatient setting and follow-up with primary care.  Treatment plan discussed at length with patient and she understand was agreeable to said plan.  Patient overall well-appearing, afebrile in no acute distress. Worrisome signs and symptoms were discussed with the patient, and the patient acknowledged understanding to return to the ED if noticed. Patient was stable upon  discharge. ]         Final Clinical Impression(s) / ED Diagnoses Final diagnoses:  Pleuritic chest pain    Rx / DC Orders ED Discharge Orders          Ordered    ibuprofen (ADVIL) 600 MG tablet  Every 6 hours PRN        10/10/22 1714    meclizine (ANTIVERT) 25 MG tablet  3 times daily PRN        10/10/22 1714              Peter Garter, [place] 10/10/22 1734    Terrilee Files, MD 10/11/22 1733

## 2022-10-10 NOTE — ED Triage Notes (Signed)
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

## 2023-01-13 ENCOUNTER — Ambulatory Visit (INDEPENDENT_AMBULATORY_CARE_PROVIDER_SITE_OTHER): Payer: 59 | Admitting: *Deleted

## 2023-01-13 ENCOUNTER — Other Ambulatory Visit (HOSPITAL_COMMUNITY)
Admission: RE | Admit: 2023-01-13 | Discharge: 2023-01-13 | Disposition: A | Discharge status: Home / Self Care | Payer: 59 | Source: Ambulatory Visit | Attending: Obstetrics & Gynecology | Admitting: Obstetrics & Gynecology

## 2023-01-13 DIAGNOSIS — R300 Dysuria: Secondary | ICD-10-CM | POA: Diagnosis not present

## 2023-01-13 DIAGNOSIS — Z113 Encounter for screening for infections with a predominantly sexual mode of transmission: Secondary | ICD-10-CM | POA: Diagnosis not present

## 2023-01-13 DIAGNOSIS — R350 Frequency of micturition: Secondary | ICD-10-CM

## 2023-01-13 LAB — POCT URINALYSIS DIPSTICK
Blood, UA: NEGATIVE
Glucose, UA: NEGATIVE
Ketones, UA: NEGATIVE
Nitrite, UA: NEGATIVE
Protein, UA: NEGATIVE

## 2023-01-13 NOTE — Progress Notes (Signed)
   NURSE VISIT- VAGINITIS/STD  SUBJECTIVE:  Sarah Campbell is a 22 y.o. [id number] GYN patientfemale here for a vaginal swab for vaginitis screening, STD screen.  She reports the following symptoms: urinary frequency, burning with urination and pressure with urination X 1 week. Partner cheated.  Denies abnormal vaginal bleeding, significant pelvic pain, fever.  OBJECTIVE:  There were no vitals taken for this visit.  Appears well, in no apparent distress  ASSESSMENT: Vaginal swab for vaginitis screening & STD screening. Urine sent for u/a and culture  PLAN: Self-collected vaginal probe for Gonorrhea, Chlamydia, Trichomonas, Bacterial Vaginosis, Yeast sent to lab. Urine sent for culture and u/a. Treatment: to be determined once results are received Follow-up as needed if symptoms persist/worsen, or new symptoms develop  Malachy Mood  01/13/2023 4:03 PM

## 2023-01-15 ENCOUNTER — Other Ambulatory Visit: Payer: Self-pay | Admitting: Adult Health

## 2023-01-15 LAB — CERVICOVAGINAL ANCILLARY ONLY
Bacterial Vaginitis (gardnerella): POSITIVE — AB
Candida Glabrata: NEGATIVE
Candida Vaginitis: NEGATIVE
Chlamydia: NEGATIVE
Comment: NEGATIVE
Comment: NEGATIVE
Comment: NEGATIVE
Comment: NEGATIVE
Comment: NEGATIVE
Comment: NORMAL
Neisseria Gonorrhea: NEGATIVE
Trichomonas: NEGATIVE

## 2023-01-15 LAB — URINALYSIS, ROUTINE W REFLEX MICROSCOPIC
Bilirubin, UA: NEGATIVE
Glucose, UA: NEGATIVE
Ketones, UA: NEGATIVE
Nitrite, UA: NEGATIVE
Protein,UA: NEGATIVE
RBC, UA: NEGATIVE
Specific Gravity, UA: 1.022 (ref 1.005–1.030)
Urobilinogen, Ur: 1 mg/dL (ref 0.2–1.0)
pH, UA: 7 (ref 5.0–7.5)

## 2023-01-15 LAB — MICROSCOPIC EXAMINATION
Bacteria, UA: NONE SEEN
Casts: NONE SEEN /[LPF]
RBC, Urine: NONE SEEN /[HPF] (ref 0–2)

## 2023-01-15 MED ORDER — METRONIDAZOLE 500 MG PO TABS: 500.0000 mg | ORAL_TABLET | Freq: Two times a day (BID) | ORAL | 0 refills | Status: DC

## 2023-01-15 NOTE — Progress Notes (Signed)
+  BV on vaginal swab will rx flagyl,no sex or alcohol while taking  ?

## 2023-01-17 ENCOUNTER — Other Ambulatory Visit: Payer: Self-pay | Admitting: Adult Health

## 2023-01-17 LAB — URINE CULTURE

## 2023-01-17 MED ORDER — SULFAMETHOXAZOLE-TRIMETHOPRIM 800-160 MG PO TABS: 1.0000 | ORAL_TABLET | Freq: Two times a day (BID) | ORAL | 0 refills | Status: DC

## 2023-01-17 NOTE — Progress Notes (Signed)
+  Klebsiella oxytoca on urine culture,culture is not final but will send in rx for septra ds, push fluids

## 2023-04-01 ENCOUNTER — Ambulatory Visit (INDEPENDENT_AMBULATORY_CARE_PROVIDER_SITE_OTHER): Admitting: *Deleted

## 2023-04-01 VITALS — BP 123/75 | HR 98

## 2023-04-01 DIAGNOSIS — Z3201 Encounter for pregnancy test, result positive: Secondary | ICD-10-CM

## 2023-04-01 LAB — POCT URINE PREGNANCY: Preg Test, Ur: POSITIVE — AB

## 2023-04-01 NOTE — Progress Notes (Signed)
   NURSE VISIT- PREGNANCY CONFIRMATION   SUBJECTIVE:  Sarah Campbell is a 23 y.o. [phone] female at [redacted]w[redacted]d by certain LMP of Patient's last menstrual period was 08/18/2022. Here for pregnancy confirmation.  Home pregnancy test: positive x 2   She reports no complaints.  She is not taking prenatal vitamins.    OBJECTIVE:  BP 123/75 (BP Location: Right Arm, Patient Position: Sitting, Cuff Size: Normal)   Pulse 98   LMP 08/18/2022 Comment: start  Breastfeeding No   Appears well, in no apparent distress  No results found for this or any previous visit (from the past 24 hours).  ASSESSMENT: Positive pregnancy test, [redacted]w[redacted]d by LMP    PLAN: Schedule for dating ultrasound in 2 weeks Prenatal vitamins: plans to begin OTC ASAP   Nausea medicines: not currently needed   OB packet given: Yes  Annamarie Dawley  04/01/2023 12:04 PM

## 2023-04-15 ENCOUNTER — Other Ambulatory Visit: Payer: Self-pay | Admitting: Obstetrics & Gynecology

## 2023-04-15 DIAGNOSIS — Z8759 Personal history of other complications of pregnancy, childbirth and the puerperium: Secondary | ICD-10-CM

## 2023-04-15 DIAGNOSIS — O3680X0 Pregnancy with inconclusive fetal viability, not applicable or unspecified: Secondary | ICD-10-CM

## 2023-04-17 ENCOUNTER — Other Ambulatory Visit: Admitting: Radiology

## 2023-04-23 ENCOUNTER — Encounter (HOSPITAL_COMMUNITY): Payer: Self-pay

## 2023-04-23 ENCOUNTER — Other Ambulatory Visit: Payer: Self-pay

## 2023-04-23 ENCOUNTER — Emergency Department (HOSPITAL_COMMUNITY)
Admission: EM | Admit: 2023-04-23 | Discharge: 2023-04-24 | Disposition: A | Discharge status: Home / Self Care | Attending: Emergency Medicine | Admitting: Emergency Medicine

## 2023-04-23 DIAGNOSIS — U071 COVID-19: Secondary | ICD-10-CM | POA: Diagnosis not present

## 2023-04-23 DIAGNOSIS — O26891 Other specified pregnancy related conditions, first trimester: Secondary | ICD-10-CM | POA: Diagnosis not present

## 2023-04-23 DIAGNOSIS — O98511 Other viral diseases complicating pregnancy, first trimester: Secondary | ICD-10-CM | POA: Diagnosis present

## 2023-04-23 DIAGNOSIS — B349 Viral infection, unspecified: Secondary | ICD-10-CM

## 2023-04-23 DIAGNOSIS — O209 Hemorrhage in early pregnancy, unspecified: Secondary | ICD-10-CM | POA: Insufficient documentation

## 2023-04-23 DIAGNOSIS — Z3A08 8 weeks gestation of pregnancy: Secondary | ICD-10-CM | POA: Insufficient documentation

## 2023-04-23 LAB — RESP PANEL BY RT-PCR (RSV, FLU A&B, COVID)  RVPGX2
Influenza A by PCR: NEGATIVE
Influenza B by PCR: NEGATIVE
Resp Syncytial Virus by PCR: NEGATIVE
SARS Coronavirus 2 by RT PCR: POSITIVE — AB

## 2023-04-23 LAB — CBC
HCT: 32.1 % — ABNORMAL LOW (ref 36.0–46.0)
Hemoglobin: 10.6 g/dL — ABNORMAL LOW (ref 12.0–15.0)
MCH: 29 pg (ref 26.0–34.0)
MCHC: 33 g/dL (ref 30.0–36.0)
MCV: 87.7 fL (ref 80.0–100.0)
Platelets: 307 10*3/uL (ref 150–400)
RBC: 3.66 MIL/uL — ABNORMAL LOW (ref 3.87–5.11)
RDW: 13.1 % (ref 11.5–15.5)
WBC: 9.9 10*3/uL (ref 4.0–10.5)
nRBC: 0 % (ref 0.0–0.2)

## 2023-04-23 LAB — HCG, QUANTITATIVE, PREGNANCY: hCG, Beta Chain, Quant, S: 15381 m[IU]/mL — ABNORMAL HIGH (ref <5)

## 2023-04-23 NOTE — ED Triage Notes (Signed)
 Pt reports congestion, cough, and not feeling well, pt also is having miscarriage with heavy bleeding and cramping, but has been seen for this. Pt sent from 3rd floor to be evaluated for flu like sx as she is being sent home for feeling bad.

## 2023-04-23 NOTE — ED Provider Notes (Signed)
 [place] EMERGENCY DEPARTMENT AT [facility] Provider Note   CSN: [id number] Arrival date & time: 04/23/23  1934     History  Chief Complaint  Patient presents with   flu like sx    Sarah Campbell is a 23 y.o. female with history including heart murmur, preeclampsia who was roughly [redacted] weeks pregnant when she had a spontaneous miscarriage 6 days ago.  [facility], when she developed heavy bleeding and passage of clots 6 days ago she presented to [facility] at which time she had a confirmatory miscarriage by ultrasound.  She continues to have a heavy period including lower pelvic cramping.  She denies weakness or lightheadedness with positional changes, she is currently using tampons although she initially used pads when her bleeding first started, changing a tampon approximately every hour currently.  She was at work and her coworkers thought she looked pale and was encouraged to come in for evaluation.  She also endorses some nasal congestion along with clear rhinorrhea, she thinks this is allergy, she has had a mild cough but denies shortness of breath, also no fevers or chills, body aches. She was diagnosed with Covid about 2 weeks ago.    She is scheduled to see her regular OB in 2 days.  The history is provided by the patient.       Home Medications Prior to Admission medications   Medication Sig Start Date End Date Taking? Authorizing Provider  acetaminophen (TYLENOL) 325 MG tablet Take 2 tablets (650 mg total) by mouth every 6 (six) hours as needed (for pain scale < 4). 12/02/21   Myna Hidalgo, DO  omeprazole (PRILOSEC) 20 MG capsule Take 1 capsule (20 mg total) by mouth 2 (two) times daily. 06/24/18 08/10/18  Rennis Harding, PA-C      Allergies    Patient has no known allergies.    Review of Systems   Review of Systems  Constitutional:  Negative for chills and fever.  HENT:  Positive for congestion and rhinorrhea.  Negative for sore throat.   Eyes: Negative.   Respiratory:  Positive for cough. Negative for chest tightness and shortness of breath.   Cardiovascular:  Negative for chest pain.  Gastrointestinal:  Negative for abdominal pain and nausea.  Genitourinary:  Positive for vaginal bleeding.  Musculoskeletal:  Negative for arthralgias, joint swelling and neck pain.  Skin: Negative.  Negative for rash and wound.  Neurological:  Negative for dizziness, weakness, light-headedness, numbness and headaches.  Psychiatric/Behavioral: Negative.      Physical Exam Updated Vital Signs BP (!) 129/93   Pulse 67   Temp 98.9 F (37.2 C) (Oral)   Resp 18   Ht 5\' 6"  (1.676 m)   Wt 60.3 kg   LMP 04/23/2023 (Exact Date)   SpO2 97%   BMI 21.47 kg/m  Physical Exam Vitals and nursing note reviewed.  Constitutional:      Appearance: She is well-developed.  HENT:     Head: Normocephalic and atraumatic.  Eyes:     Conjunctiva/sclera: Conjunctivae normal.  Cardiovascular:     Rate and Rhythm: Normal rate and regular rhythm.     Heart sounds: Normal heart sounds.  Pulmonary:     Effort: Pulmonary effort is normal.     Breath sounds: Normal breath sounds. No wheezing or rhonchi.  Abdominal:     General: Bowel sounds are normal.     Palpations: Abdomen is soft.     Tenderness:  There is abdominal tenderness.     Comments: Lower pelvic soreness, no guarding  Musculoskeletal:        General: Normal range of motion.     Cervical back: Normal range of motion.  Skin:    General: Skin is warm and dry.     Coloration: Skin is pale.  Neurological:     Mental Status: She is alert and oriented to person, place, and time.  Psychiatric:        Mood and Affect: Mood normal.     ED Results / Procedures / Treatments   Labs (all labs ordered are listed, but only abnormal results are displayed) Labs Reviewed  RESP PANEL BY RT-PCR (RSV, FLU A&B, COVID)  RVPGX2 - Abnormal; Notable for the following components:       Result Value   SARS Coronavirus 2 by RT PCR POSITIVE (*)    All other components within normal limits  CBC - Abnormal; Notable for the following components:   RBC 3.66 (*)    Hemoglobin 10.6 (*)    HCT 32.1 (*)    All other components within normal limits  HCG, QUANTITATIVE, PREGNANCY - Abnormal; Notable for the following components:   hCG, Beta Chain, Quant, S 15,381 (*)    All other components within normal limits    EKG None  Radiology No results found.  Procedures Procedures    Medications Ordered in ED Medications - No data to display  ED Course/ Medical Decision Making/ A&P                                 Medical Decision Making Patient presenting with persistent low pelvic cramping and vaginal bleeding, passage of clots and recent days, per LMP currently [redacted] weeks pregnant.  She was seen at [facility] when her symptoms began, initially confirming she had an ultrasound to confirm intrauterine demise.  On attempt to confirm via [facility] records, unable to locate this visit.  On repeat conversation with patient with details about that visit, she describes a test where they tried to locate fetal heart tones, this was not actually an ultrasound.  She will need an ultrasound here to rule out ectopic and is willing to stay for this test which has been ordered.  Dr. Pilar Plate assumes care.  Amount and/or Complexity of Data Reviewed Labs: ordered. Radiology: ordered.           Final Clinical Impression(s) / ED Diagnoses Final diagnoses:  COVID-19    Rx / DC Orders ED Discharge Orders     None         Victoriano Lain 04/24/23 0002    Loetta Rough, MD 04/25/23 1344

## 2023-04-24 ENCOUNTER — Emergency Department (HOSPITAL_COMMUNITY)

## 2023-04-24 DIAGNOSIS — O98511 Other viral diseases complicating pregnancy, first trimester: Secondary | ICD-10-CM | POA: Diagnosis not present

## 2023-04-24 NOTE — ED Notes (Signed)
 ED Provider at bedside.

## 2023-04-24 NOTE — Discharge Instructions (Addendum)
 You were evaluated in the Emergency Department and after careful evaluation, we did not find any emergent condition requiring admission or further testing in the hospital.  Your exam/testing today is overall reassuring.  We discussed your ultrasound findings and the possibility of a miscarriage.  Important that you follow-up with your OB/GYN this week to discuss the tests we did today in the emergency department and likely for some repeat testing.  Please return to the Emergency Department if you experience any worsening of your condition.   Thank you for allowing [place] to be a part of your care.

## 2023-04-24 NOTE — ED Provider Notes (Signed)
  Provider Note MRN:  [id number]  Arrival date & time: 04/24/23    ED Course and Medical Decision Making  Assumed care of patient at sign-out or upon transfer.  Endocrine employee sent down here for evaluation for looking pale.  Has tested positive for COVID.  Also having some vaginal bleeding, [redacted] weeks pregnant awaiting ultrasound to exclude ectopic.  2 AM update: Patient well-appearing on my assessment with normal vital signs, soft and nontender abdomen.  Workup revealing IUP, possible miscarriage.  Patient made aware of this possibility, will follow-up with OB/GYN for likely repeat hCG and/or ultrasound.  Procedures  Final Clinical Impressions(s) / ED Diagnoses     ICD-10-CM   1. Viral illness  B34.9     2. Vaginal bleeding affecting early pregnancy  O20.9       ED Discharge Orders     None         Discharge Instructions      You were evaluated in the Emergency Department and after careful evaluation, we did not find any emergent condition requiring admission or further testing in the hospital.  Your exam/testing today is overall reassuring.  We discussed your ultrasound findings and the possibility of a miscarriage.  Important that you follow-up with your OB/GYN this week to discuss the tests we did today in the emergency department and likely for some repeat testing.  Please return to the Emergency Department if you experience any worsening of your condition.   Thank you for allowing [place] to be a part of your care.      Elmer Sow. Pilar Plate, MD [facility] Emergency Medicine [facility] [email or web address]    Sabas Sous, MD 04/24/23 [id number]

## 2023-04-24 NOTE — ED Notes (Signed)
Portable US at bedside.

## 2023-04-28 ENCOUNTER — Other Ambulatory Visit

## 2023-04-28 DIAGNOSIS — O200 Threatened abortion: Secondary | ICD-10-CM

## 2023-04-28 DIAGNOSIS — O209 Hemorrhage in early pregnancy, unspecified: Secondary | ICD-10-CM

## 2023-05-23 ENCOUNTER — Emergency Department (HOSPITAL_COMMUNITY)
Admission: EM | Admit: 2023-05-23 | Discharge: 2023-05-23 | Disposition: A | Discharge status: Home / Self Care | Attending: Emergency Medicine | Admitting: Emergency Medicine

## 2023-05-23 ENCOUNTER — Encounter (HOSPITAL_COMMUNITY): Payer: Self-pay

## 2023-05-23 ENCOUNTER — Other Ambulatory Visit: Payer: Self-pay

## 2023-05-23 DIAGNOSIS — N939 Abnormal uterine and vaginal bleeding, unspecified: Secondary | ICD-10-CM | POA: Insufficient documentation

## 2023-05-23 DIAGNOSIS — D649 Anemia, unspecified: Secondary | ICD-10-CM | POA: Insufficient documentation

## 2023-05-23 LAB — CBC
HCT: 33.7 % — ABNORMAL LOW (ref 36.0–46.0)
Hemoglobin: 10.5 g/dL — ABNORMAL LOW (ref 12.0–15.0)
MCH: 27.1 pg (ref 26.0–34.0)
MCHC: 31.2 g/dL (ref 30.0–36.0)
MCV: 86.9 fL (ref 80.0–100.0)
Platelets: 377 10*3/uL (ref 150–400)
RBC: 3.88 MIL/uL (ref 3.87–5.11)
RDW: 12.6 % (ref 11.5–15.5)
WBC: 11.4 10*3/uL — ABNORMAL HIGH (ref 4.0–10.5)
nRBC: 0 % (ref 0.0–0.2)

## 2023-05-23 LAB — URINALYSIS, ROUTINE W REFLEX MICROSCOPIC
Bilirubin Urine: NEGATIVE
Glucose, UA: 100 mg/dL — AB
Ketones, ur: 15 mg/dL — AB
Nitrite: POSITIVE — AB
Protein, ur: >300 mg/dL — AB
RBC / HPF: >50 RBC/hpf (ref 0–5)
Specific Gravity, Urine: 1.025 (ref 1.005–1.030)
WBC, UA: >50 WBC/hpf (ref 0–5)
pH: 5 (ref 5.0–8.0)

## 2023-05-23 LAB — WET PREP, GENITAL
Sperm: NONE SEEN
Trich, Wet Prep: NONE SEEN
Yeast Wet Prep HPF POC: NONE SEEN

## 2023-05-23 LAB — HIV ANTIBODY (ROUTINE TESTING W REFLEX): HIV Screen 4th Generation wRfx: NONREACTIVE

## 2023-05-23 LAB — POC URINE PREG, ED: Preg Test, Ur: NEGATIVE

## 2023-05-23 LAB — TYPE AND SCREEN
ABO/RH(D): O POS
Antibody Screen: NEGATIVE

## 2023-05-23 LAB — RPR: RPR Ser Ql: NONREACTIVE

## 2023-05-23 MED ORDER — MEDROXYPROGESTERONE ACETATE 10 MG PO TABS: 10.0000 mg | ORAL_TABLET | Freq: Every day | ORAL | Status: DC

## 2023-05-23 MED ORDER — MEDROXYPROGESTERONE ACETATE 10 MG PO TABS: 10.0000 mg | ORAL_TABLET | Freq: Every day | ORAL | 0 refills | Status: DC

## 2023-05-23 NOTE — Discharge Instructions (Addendum)
 Follow-up with the gynecologist.  Return to the emergency department if your bleeding is getting worse.

## 2023-05-23 NOTE — ED Provider Notes (Signed)
 [place] EMERGENCY DEPARTMENT AT [facility] Provider Note   CSN: [id number] Arrival date & time: 05/23/23  0405     History  Chief Complaint  Patient presents with   Vaginal Bleeding    Sarah Campbell is a 23 y.o. female.  The history is provided by the patient.  Vaginal Bleeding She had a miscarriage about 6 weeks ago and has been having bleeding for the last month.  Bleeding got much worse tonight with passing clots and having cramping.  She did take acetaminophen  for the cramping.  She is gravida 2, para 1.   Home Medications Prior to Admission medications   Medication Sig Start Date End Date Taking? Authorizing Provider  medroxyPROGESTERone  (PROVERA ) 10 MG tablet Take 1 tablet (10 mg total) by mouth daily. 05/23/23  Yes Alissa April, MD  acetaminophen  (TYLENOL ) 325 MG tablet Take 2 tablets (650 mg total) by mouth every 6 (six) hours as needed (for pain scale < 4). 12/02/21   Ozan, Jennifer, DO  omeprazole  (PRILOSEC ) 20 MG capsule Take 1 capsule (20 mg total) by mouth 2 (two) times daily. 06/24/18 08/10/18  Clover Dao, PA-C      Allergies    Patient has no known allergies.    Review of Systems   Review of Systems  Genitourinary:  Positive for vaginal bleeding.  All other systems reviewed and are negative.   Physical Exam Updated Vital Signs BP 101/69   Pulse 76   Temp 98.2 F (36.8 C) (Oral)   Resp 16   Ht 5\' 6"  (1.676 m)   Wt 62.1 kg   LMP 04/23/2023 (Exact Date)   SpO2 99%   BMI 22.11 kg/m  Physical Exam Vitals and nursing note reviewed.   23 year old female, resting comfortably and in no acute distress. Vital signs are normal. Oxygen saturation is 100%, which is normal. Head is normocephalic and atraumatic. PERRLA, EOMI.  Lungs are clear without rales, wheezes, or rhonchi. Chest is nontender. Heart has regular rate and rhythm without murmur. Abdomen is soft, flat, nontender. Pelvic: Normal external female genitalia.  Small amount of  blood present in the vaginal vault.  Cervix is closed.  There are no adnexal masses or tenderness.  Fundus is normal size and position. Neurologic: Mental status is normal, moves all extremities equally.  ED Results / Procedures / Treatments   Labs (all labs ordered are listed, but only abnormal results are displayed) Labs Reviewed  WET PREP, GENITAL - Abnormal; Notable for the following components:      Result Value   Clue Cells Wet Prep HPF POC PRESENT (*)    WBC, Wet Prep HPF POC MANY (*)    All other components within normal limits  CBC - Abnormal; Notable for the following components:   WBC 11.4 (*)    Hemoglobin 10.5 (*)    HCT 33.7 (*)    All other components within normal limits  URINALYSIS, ROUTINE W REFLEX MICROSCOPIC - Abnormal; Notable for the following components:   Color, Urine RED (*)    APPearance CLOUDY (*)    Glucose, UA 100 (*)    Hgb urine dipstick LARGE (*)    Ketones, ur 15 (*)    Protein, ur >300 (*)    Nitrite POSITIVE (*)    Leukocytes,Ua MODERATE (*)    Bacteria, UA MANY (*)    All other components within normal limits  RPR  HIV ANTIBODY (ROUTINE TESTING W REFLEX)  POC URINE PREG, ED  TYPE AND SCREEN  GC/CHLAMYDIA PROBE AMP ([place]) NOT AT [facility]   Procedures Procedures    Medications Ordered in ED Medications  medroxyPROGESTERone  (PROVERA ) tablet 10 mg (has no administration in time range)    ED Course/ Medical Decision Making/ A&P                                 Medical Decision Making Amount and/or Complexity of Data Reviewed Labs: ordered.  Risk Prescription drug management.   Abnormal vaginal bleeding.  Major concern would be retained products of conception.  I have reviewed her laboratory tests, and my interpretation is mild anemia which is unchanged compared with 04/23/2023, negative urine pregnancy test, urinalysis obviously contaminated.  Positive nitrite is not to be considered indicative of infection as there is color  interference from red urine.  Wet prep does show presence of clue cells, but patient does not clinically have bacterial vaginosis I have elected not to treated.  I have ordered a dose of medroxyprogesterone  and I am discharging her with a prescription for medroxyprogesterone .  I am referring her to gynecology for follow-up.  Final Clinical Impression(s) / ED Diagnoses Final diagnoses:  Abnormal vaginal bleeding  Normochromic normocytic anemia    Rx / DC Orders ED Discharge Orders          Ordered    medroxyPROGESTERone  (PROVERA ) 10 MG tablet  Daily        05/23/23 0656              Alissa April, MD 05/23/23 [phone]

## 2023-05-23 NOTE — ED Triage Notes (Signed)
 Pt had confirmed miscarriage in march. Has not made an appointment with OB. Passing large clots 'The size of golf balls' as well as heavy bleeding. Increased heaviness started about four hours ago.

## 2023-05-26 LAB — GC/CHLAMYDIA PROBE AMP (~~LOC~~) NOT AT ARMC
Chlamydia: NEGATIVE
Comment: NEGATIVE
Comment: NORMAL
Neisseria Gonorrhea: NEGATIVE

## 2023-05-27 ENCOUNTER — Inpatient Hospital Stay (HOSPITAL_COMMUNITY)
Admission: RE | Admit: 2023-05-27 | Discharge: 2023-05-27 | Disposition: A | Discharge status: Home / Self Care | Source: Ambulatory Visit

## 2023-05-27 ENCOUNTER — Encounter: Payer: Self-pay | Admitting: Obstetrics & Gynecology

## 2023-05-27 ENCOUNTER — Other Ambulatory Visit (HOSPITAL_COMMUNITY)

## 2023-05-27 ENCOUNTER — Ambulatory Visit: Admitting: Obstetrics & Gynecology

## 2023-05-27 VITALS — BP 119/83 | HR 99 | Ht 66.0 in | Wt 137.0 lb

## 2023-05-27 DIAGNOSIS — O034 Incomplete spontaneous abortion without complication: Secondary | ICD-10-CM | POA: Diagnosis not present

## 2023-05-27 DIAGNOSIS — Z3A01 Less than 8 weeks gestation of pregnancy: Secondary | ICD-10-CM

## 2023-05-27 NOTE — Progress Notes (Signed)
 Follow up appointment for: Continued bleeding after pregnancy loss  Chief Complaint  Patient presents with   Follow-up    Recent SAB, still bleeding    Blood pressure 119/83, pulse 99, height 5\' 6"  (1.676 m), weight 137 lb (62.1 kg), not currently breastfeeding.  Non viable pregnancy diagnosed 3/27 sonogram, actively miscarrying at that time  Has continued to bleed since that time  HCG 15381 on that day  Has continued to bleed sometimes heavy with clots  Sonogram POC by me this am shows retained POC/gestational sac, no yolk sac or fetal pole consistent still with non viable pregnancy but with retained POC/sac    Discussed options and pt prefers D&C for management  Labs performed 4 days ago all normal, no need to repeat  MEDS ordered this encounter: No orders of the defined types were placed in this encounter.   Orders for this encounter: No orders of the defined types were placed in this encounter.   Impression + Management Plan   ICD-10-CM   1. Incomplete abortion, first trimester  O03.4    Pt desires surgical management and preparations made for that for tomorrow 05/28/23      Follow Up: Return in about 2 weeks (around 06/10/2023) for Follow up, Post Op, with Dr Randolm Butte.     All questions were answered.  Past Medical History:  Diagnosis Date   Dysmenorrhea 10/24/2020   General counseling and advice on contraceptive management 05/03/2019   Heart murmur    Preeclampsia    Pregnancy induced hypertension    Screening examination for STD (sexually transmitted disease) 03/07/2020    Past Surgical History:  Procedure Laterality Date   NO PAST SURGERIES      OB History     Gravida  3   Para  1   Term  0   Preterm  1   AB  1   Living  1      SAB  1   IAB  0   Ectopic  0   Multiple  0   Live Births  1           No Known Allergies  Social History   Socioeconomic History   Marital status: Single    Spouse name: Not on file    Number of children: Not on file   Years of education: Not on file   Highest education level: Not on file  Occupational History   Not on file  Tobacco Use   Smoking status: Every Day    Types: E-cigarettes   Smokeless tobacco: Never  Vaping Use   Vaping status: Some Days   Substances: Nicotine, Flavoring  Substance and Sexual Activity   Alcohol use: Yes   Drug use: No   Sexual activity: Not Currently    Birth control/protection: None  Other Topics Concern   Not on file  Social History Narrative   ** Merged History Encounter **       Social Drivers of Health   Financial Resource Strain: Low Risk  (09/20/2021)   Overall Financial Resource Strain (CARDIA)    Difficulty of Paying Living Expenses: Not hard at all  Food Insecurity: No Food Insecurity (11/28/2021)   Hunger Vital Sign    Worried About Running Out of Food in the Last Year: Never true    Ran Out of Food in the Last Year: Never true  Transportation Needs: No Transportation Needs (11/28/2021)   PRAPARE - Administrator, Civil Service (Medical):  No    Lack of Transportation (Non-Medical): No  Physical Activity: Sufficiently Active (09/20/2021)   Exercise Vital Sign    Days of Exercise per Week: 7 days    Minutes of Exercise per Session: 30 min  Stress: No Stress Concern Present (09/20/2021)   [employer] of Occupational Health - Occupational Stress Questionnaire    Feeling of Stress : Not at all  Social Connections: Moderately Isolated (09/20/2021)   Social Connection and Isolation Panel [NHANES]    Frequency of Communication with Friends and Family: More than three times a week    Frequency of Social Gatherings with Friends and Family: Twice a week    Attends Religious Services: 1 to 4 times per year    Active Member of [employer] or Organizations: No    Attends Engineer, structural: Never    Marital Status: Never married    Family History  Problem Relation Age of Onset   Heart attack Paternal  Grandmother    Diabetes Maternal Grandfather    Heart attack Maternal Grandfather    Stroke Maternal Grandfather    Alcoholism Father    Heart attack Maternal Aunt    Stroke Maternal Aunt

## 2023-05-28 ENCOUNTER — Ambulatory Visit (HOSPITAL_COMMUNITY)
Admission: RE | Admit: 2023-05-28 | Discharge: 2023-05-28 | Disposition: A | Discharge status: Home / Self Care | Attending: Obstetrics & Gynecology | Admitting: Obstetrics & Gynecology

## 2023-05-28 ENCOUNTER — Other Ambulatory Visit: Payer: Self-pay

## 2023-05-28 ENCOUNTER — Encounter (HOSPITAL_COMMUNITY)
Admission: RE | Disposition: A | Discharge status: Home / Self Care | Payer: Self-pay | Source: Home / Self Care | Attending: Obstetrics & Gynecology

## 2023-05-28 ENCOUNTER — Other Ambulatory Visit: Payer: Self-pay | Admitting: Obstetrics & Gynecology

## 2023-05-28 ENCOUNTER — Ambulatory Visit (HOSPITAL_COMMUNITY): Admitting: Anesthesiology

## 2023-05-28 ENCOUNTER — Encounter (HOSPITAL_COMMUNITY): Payer: Self-pay | Admitting: Obstetrics & Gynecology

## 2023-05-28 ENCOUNTER — Ambulatory Visit (HOSPITAL_BASED_OUTPATIENT_CLINIC_OR_DEPARTMENT_OTHER): Admitting: Anesthesiology

## 2023-05-28 DIAGNOSIS — Z3A00 Weeks of gestation of pregnancy not specified: Secondary | ICD-10-CM

## 2023-05-28 DIAGNOSIS — F17290 Nicotine dependence, other tobacco product, uncomplicated: Secondary | ICD-10-CM | POA: Diagnosis not present

## 2023-05-28 DIAGNOSIS — O034 Incomplete spontaneous abortion without complication: Secondary | ICD-10-CM

## 2023-05-28 HISTORY — PX: DILATION AND EVACUATION: SHX1459

## 2023-05-28 SURGERY — DILATION AND EVACUATION, UTERUS
Anesthesia: General | Site: Vagina

## 2023-05-28 MED ORDER — BUPIVACAINE HCL (PF) 0.5 % IJ SOLN
INTRAMUSCULAR | Status: AC
  Filled 2023-05-28: qty 30

## 2023-05-28 MED ORDER — ACETAMINOPHEN 160 MG/5ML PO SOLN
960.0000 mg | Freq: Once | ORAL | Status: AC
  Filled 2023-05-28: qty 30

## 2023-05-28 MED ORDER — DEXAMETHASONE SODIUM PHOSPHATE 10 MG/ML IJ SOLN
INTRAMUSCULAR | Status: AC
  Filled 2023-05-28: qty 1

## 2023-05-28 MED ORDER — PROPOFOL 10 MG/ML IV BOLUS
INTRAVENOUS | Status: DC | PRN
  Administered 2023-05-28: 200 mg via INTRAVENOUS

## 2023-05-28 MED ORDER — OXYCODONE HCL 5 MG PO TABS: 5.0000 mg | ORAL_TABLET | Freq: Once | ORAL | Status: DC | PRN

## 2023-05-28 MED ORDER — CHLORHEXIDINE GLUCONATE 0.12 % MT SOLN
15.0000 mL | Freq: Once | OROMUCOSAL | Status: AC
  Administered 2023-05-28: 15 mL via OROMUCOSAL
  Filled 2023-05-28: qty 15

## 2023-05-28 MED ORDER — BUPIVACAINE HCL (PF) 0.5 % IJ SOLN
INTRAMUSCULAR | Status: DC | PRN
  Administered 2023-05-28: 20 mL

## 2023-05-28 MED ORDER — DEXAMETHASONE SODIUM PHOSPHATE 10 MG/ML IJ SOLN
INTRAMUSCULAR | Status: DC | PRN
  Administered 2023-05-28: 10 mg via INTRAVENOUS

## 2023-05-28 MED ORDER — CEFAZOLIN SODIUM-DEXTROSE 2-4 GM/100ML-% IV SOLN
2.0000 g | INTRAVENOUS | Status: AC
  Administered 2023-05-28: 2 g via INTRAVENOUS
  Filled 2023-05-28: qty 100

## 2023-05-28 MED ORDER — SODIUM CHLORIDE 0.9 % IV SOLN: 12.5000 mg | INTRAVENOUS | Status: DC | PRN

## 2023-05-28 MED ORDER — 0.9 % SODIUM CHLORIDE (POUR BTL) OPTIME
TOPICAL | Status: DC | PRN
  Administered 2023-05-28: 1000 mL

## 2023-05-28 MED ORDER — ORAL CARE MOUTH RINSE: 15.0000 mL | Freq: Once | OROMUCOSAL | Status: AC

## 2023-05-28 MED ORDER — ACETAMINOPHEN 500 MG PO TABS
1000.0000 mg | ORAL_TABLET | Freq: Once | ORAL | Status: AC
  Administered 2023-05-28: 1000 mg via ORAL
  Filled 2023-05-28: qty 2

## 2023-05-28 MED ORDER — MISOPROSTOL 200 MCG PO TABS: 400.0000 ug | ORAL_TABLET | Freq: Three times a day (TID) | ORAL | 0 refills | Status: DC

## 2023-05-28 MED ORDER — PROPOFOL 10 MG/ML IV BOLUS
INTRAVENOUS | Status: AC
  Filled 2023-05-28: qty 20

## 2023-05-28 MED ORDER — KETOROLAC TROMETHAMINE 10 MG PO TABS: 10.0000 mg | ORAL_TABLET | Freq: Three times a day (TID) | ORAL | 0 refills | Status: DC | PRN

## 2023-05-28 MED ORDER — LACTATED RINGERS IV SOLN
INTRAVENOUS | Status: DC
Start: 2023-05-28 — End: 2023-05-28

## 2023-05-28 MED ORDER — KETOROLAC TROMETHAMINE 30 MG/ML IJ SOLN
30.0000 mg | INTRAMUSCULAR | Status: AC
  Administered 2023-05-28: 30 mg via INTRAVENOUS
  Filled 2023-05-28: qty 1

## 2023-05-28 MED ORDER — FENTANYL CITRATE (PF) 100 MCG/2ML IJ SOLN
INTRAMUSCULAR | Status: DC | PRN
  Administered 2023-05-28: 100 ug via INTRAVENOUS

## 2023-05-28 MED ORDER — OXYCODONE HCL 5 MG/5ML PO SOLN: 5.0000 mg | Freq: Once | ORAL | Status: DC | PRN

## 2023-05-28 MED ORDER — FENTANYL CITRATE (PF) 100 MCG/2ML IJ SOLN
INTRAMUSCULAR | Status: AC
  Filled 2023-05-28: qty 2

## 2023-05-28 MED ORDER — DOXYCYCLINE HYCLATE 100 MG PO TABS: 100.0000 mg | ORAL_TABLET | Freq: Two times a day (BID) | ORAL | 0 refills | Status: DC

## 2023-05-28 MED ORDER — TRANEXAMIC ACID-NACL 1000-0.7 MG/100ML-% IV SOLN: 1000.0000 mg | Freq: Once | INTRAVENOUS | Status: DC

## 2023-05-28 MED ORDER — ONDANSETRON HCL 4 MG/2ML IJ SOLN
INTRAMUSCULAR | Status: DC | PRN
Start: 2023-05-28 — End: 2023-05-28
  Administered 2023-05-28: 4 mg via INTRAVENOUS

## 2023-05-28 MED ORDER — SCOPOLAMINE 1 MG/3DAYS TD PT72
1.0000 | MEDICATED_PATCH | Freq: Once | TRANSDERMAL | Status: DC
  Administered 2023-05-28: 1.5 mg via TRANSDERMAL
  Filled 2023-05-28: qty 1

## 2023-05-28 MED ORDER — POVIDONE-IODINE 10 % EX SWAB: 2.0000 | Freq: Once | CUTANEOUS | Status: DC

## 2023-05-28 MED ORDER — MIDAZOLAM HCL 2 MG/2ML IJ SOLN
INTRAMUSCULAR | Status: DC | PRN
  Administered 2023-05-28: 2 mg via INTRAVENOUS

## 2023-05-28 MED ORDER — ONDANSETRON HCL 4 MG/2ML IJ SOLN
INTRAMUSCULAR | Status: AC
  Filled 2023-05-28: qty 2

## 2023-05-28 MED ORDER — METHYLERGONOVINE MALEATE 0.2 MG/ML IJ SOLN
INTRAMUSCULAR | Status: AC
  Filled 2023-05-28: qty 1

## 2023-05-28 MED ORDER — LACTATED RINGERS IV SOLN: INTRAVENOUS | Status: DC

## 2023-05-28 MED ORDER — ONDANSETRON 8 MG PO TBDP: 8.0000 mg | ORAL_TABLET | Freq: Three times a day (TID) | ORAL | 0 refills | Status: DC | PRN

## 2023-05-28 MED ORDER — FENTANYL CITRATE PF 50 MCG/ML IJ SOSY: 25.0000 ug | PREFILLED_SYRINGE | INTRAMUSCULAR | Status: DC | PRN

## 2023-05-28 MED ORDER — MIDAZOLAM HCL 2 MG/2ML IJ SOLN
INTRAMUSCULAR | Status: AC
  Filled 2023-05-28: qty 2

## 2023-05-28 MED ORDER — METHYLERGONOVINE MALEATE 0.2 MG/ML IJ SOLN
INTRAMUSCULAR | Status: DC | PRN
  Administered 2023-05-28: .2 mg via INTRAMUSCULAR

## 2023-05-28 SURGICAL SUPPLY — 18 items
BAG HAMPER (MISCELLANEOUS) ×1 IMPLANT
CLOTH BEACON ORANGE TIMEOUT ST (SAFETY) ×1 IMPLANT
COVER LIGHT HANDLE STERIS (MISCELLANEOUS) ×2 IMPLANT
GLOVE BIOGEL PI IND STRL 7.0 (GLOVE) ×2 IMPLANT
GLOVE BIOGEL PI IND STRL 8 (GLOVE) ×1 IMPLANT
GLOVE ECLIPSE 8.0 STRL XLNG CF (GLOVE) ×2 IMPLANT
GOWN STRL REUS W/TWL LRG LVL3 (GOWN DISPOSABLE) ×1 IMPLANT
GOWN STRL REUS W/TWL XL LVL3 (GOWN DISPOSABLE) ×1 IMPLANT
KIT BERKELEY 1ST TRIMESTER 3/8 (MISCELLANEOUS) ×1 IMPLANT
KIT TURNOVER CYSTO (KITS) ×1 IMPLANT
MANIFOLD NEPTUNE II (INSTRUMENTS) ×1 IMPLANT
NS IRRIG 1000ML POUR BTL (IV SOLUTION) ×1 IMPLANT
PACK PERI GYN (CUSTOM PROCEDURE TRAY) ×1 IMPLANT
PAD ARMBOARD POSITIONER FOAM (MISCELLANEOUS) ×1 IMPLANT
POSITIONER HEAD 8X9X4 ADT (SOFTGOODS) ×1 IMPLANT
SET BERKELEY SUCTION TUBING (SUCTIONS) ×1 IMPLANT
TOWEL OR 17X26 4PK STRL BLUE (TOWEL DISPOSABLE) ×1 IMPLANT
VACURETTE 8MM (CANNULA) IMPLANT

## 2023-05-28 NOTE — Discharge Instructions (Signed)

## 2023-05-28 NOTE — Anesthesia Procedure Notes (Signed)
 Procedure Name: LMA Insertion Date/Time: 05/28/2023 12:52 PM  Performed by: Beverely Buba, CRNAPre-anesthesia Checklist: Patient identified, Emergency Drugs available, Suction available, Patient being monitored and Timeout performed Patient Re-evaluated:Patient Re-evaluated prior to induction Oxygen Delivery Method: Circle system utilized Preoxygenation: Pre-oxygenation with 100% oxygen Induction Type: IV induction LMA: LMA inserted LMA Size: 4.0 Number of attempts: 1 Placement Confirmation: positive ETCO2, CO2 detector and breath sounds checked- equal and bilateral Tube secured with: Tape Dental Injury: Teeth and Oropharynx as per pre-operative assessment

## 2023-05-28 NOTE — Anesthesia Preprocedure Evaluation (Signed)
 Anesthesia Evaluation  Patient identified by MRN, date of birth, ID band Patient awake    Reviewed: Allergy & Precautions, H&P , NPO status , Patient's Chart, lab work & pertinent test results, reviewed documented beta blocker date and time   Airway Mallampati: II  TM Distance: >3 FB Neck ROM: full    Dental no notable dental hx. (+) Dental Advisory Given, Teeth Intact   Pulmonary neg pulmonary ROS, Current Smoker   Pulmonary exam normal breath sounds clear to auscultation       Cardiovascular Exercise Tolerance: Good hypertension, Normal cardiovascular exam Rhythm:regular Rate:Normal  History of severe preeclampsia   Neuro/Psych negative neurological ROS  negative psych ROS   GI/Hepatic negative GI ROS, Neg liver ROS,,,  Endo/Other  negative endocrine ROS    Renal/GU negative Renal ROS  negative genitourinary   Musculoskeletal   Abdominal   Peds  Hematology  (+) Blood dyscrasia, anemia Hgb 10.5   Anesthesia Other Findings   Reproductive/Obstetrics negative OB ROS                             Anesthesia Physical Anesthesia Plan  ASA: 2  Anesthesia Plan: General   Post-op Pain Management: Minimal or no pain anticipated   Induction: Intravenous  PONV Risk Score and Plan: Ondansetron , Dexamethasone and Midazolam  Airway Management Planned: LMA  Additional Equipment: None  Intra-op Plan:   Post-operative Plan: Extubation in OR  Informed Consent: I have reviewed the patients History and Physical, chart, labs and discussed the procedure including the risks, benefits and alternatives for the proposed anesthesia with the patient or authorized representative who has indicated his/her understanding and acceptance.     Dental Advisory Given  Plan Discussed with: CRNA  Anesthesia Plan Comments:         Anesthesia Quick Evaluation

## 2023-05-28 NOTE — Anesthesia Postprocedure Evaluation (Signed)
 Anesthesia Post Note  Patient: Sarah Campbell  Procedure(s) Performed: DILATION AND EVACUATION, UTERUS (Vagina )  Patient location during evaluation: Phase II Anesthesia Type: General Level of consciousness: awake and alert Pain management: pain level controlled Vital Signs Assessment: post-procedure vital signs reviewed and stable Respiratory status: spontaneous breathing, nonlabored ventilation and respiratory function stable Cardiovascular status: blood pressure returned to baseline and stable Postop Assessment: no apparent nausea or vomiting Anesthetic complications: no   There were no known notable events for this encounter.   Last Vitals:  Vitals:   05/28/23 1400 05/28/23 1413  BP: 117/83 (!) 125/97  Pulse: (!) 54 (!) 58  Resp: 14 14  Temp: 36.9 C 36.9 C  SpO2: 100% 100%    Last Pain:  Vitals:   05/28/23 1400  TempSrc:   PainSc: 0-No pain                 Maylynn Orzechowski L Krishawn Vanderweele

## 2023-05-28 NOTE — Transfer of Care (Signed)
 Immediate Anesthesia Transfer of Care Note  Patient: Sarah Campbell  Procedure(s) Performed: DILATION AND EVACUATION, UTERUS (Vagina )  Patient Location: PACU  Anesthesia Type:General  Level of Consciousness: awake, alert , oriented, and patient cooperative  Airway & Oxygen Therapy: Patient Spontanous Breathing  Post-op Assessment: Report given to RN, Post -op Vital signs reviewed and stable, and Patient moving all extremities X 4  Post vital signs: Reviewed and stable  Last Vitals:  Vitals Value Taken Time  BP 125/97 05/28/23 1413  Temp 36.9 C 05/28/23 1413  Pulse 58 05/28/23 1413  Resp 14 05/28/23 1413  SpO2 100 % 05/28/23 1413    Last Pain:  Vitals:   05/28/23 1400  TempSrc:   PainSc: 0-No pain      Patients Stated Pain Goal: 4 (05/28/23 1400)  Complications: No notable events documented.

## 2023-05-28 NOTE — Op Note (Signed)
 Preoperative diagnosis:  incomplete Ab in first trimester  Postoperative diagnosis:  Same as above  Procedure:  Cervical dilation with suction and sharp uterine curettage                     Paracervical block place by me for post op pain management  Surgeon:  Wendelyn Halter  Anesthesia:  Laryngeal mask airway  Findings:  The patient was known to have a first trimester pregnancy loss by sonograms and HCG levels.  She thought she had miscarried a month ago but began to have increased bleeding again.  I did a POC sonogram yesterday and there is still tissue present with irregular small gestational sac    She opted for surgical management  Description of operation:  The patient was taken to the operating room and placed in the supine position.  She underwent laryngeal mask airway general anesthesia.  The patient was placed in the dorsal lithotomy position.  The vagina was prepped and draped in the usual sterile fashion.  A Graves speculum was placed.  The anterior cervix was grasped with a single-tooth tenaculum.   I placed a paracervical block using 10 cc Marcaine  plain at 3 and 9 o'clock for post op pain management   The cervix was dilated serially with Hegar dilators.  A #8 curved suction curette was placed in the uterus.  The suction pressure was placed at 55 and several passes were made.  All of the intrauterine contents were removed.  The sharp curette was used x1 to feel uterine crie in all areas.  The patient was given Methergine 0.2 mg IM x1.  There was good hemostasis.  The patient was given Ancef 2 grams IV preoperatively.  The patient was given Toradol  30 mg IV preoperatively.  Estimated blood loss for the procedure was 10 cc.  The patient was awakened from anesthesia taken to the recovery room in good stable condition.  All counts were correct x3.  Wendelyn Halter, MD 05/28/2023 1:17 PM

## 2023-05-28 NOTE — H&P (Signed)
 Preoperative History and Physical  Sarah Campbell is a 23 y.o. [id number] with No LMP recorded. admitted for a suction/sharp D&C for incomplete ab.    PMH:    Past Medical History:  Diagnosis Date   Dysmenorrhea 10/24/2020   General counseling and advice on contraceptive management 05/03/2019   Heart murmur    Preeclampsia    Pregnancy induced hypertension    Screening examination for STD (sexually transmitted disease) 03/07/2020    PSH:     Past Surgical History:  Procedure Laterality Date   NO PAST SURGERIES      POb/GynH:      OB History     Gravida  3   Para  1   Term  0   Preterm  1   AB  1   Living  1      SAB  1   IAB  0   Ectopic  0   Multiple  0   Live Births  1           SH:   Social History   Tobacco Use   Smoking status: Every Day    Types: E-cigarettes   Smokeless tobacco: Never  Vaping Use   Vaping status: Some Days   Substances: Nicotine, Flavoring  Substance Use Topics   Alcohol use: Yes   Drug use: No    FH:    Family History  Problem Relation Age of Onset   Heart attack Paternal Grandmother    Diabetes Maternal Grandfather    Heart attack Maternal Grandfather    Stroke Maternal Grandfather    Alcoholism Father    Heart attack Maternal Aunt    Stroke Maternal Aunt      Allergies: No Known Allergies  Medications:       Current Facility-Administered Medications:    ceFAZolin (ANCEF) IVPB 2g/100 mL premix, 2 g, Intravenous, On Call to OR, Wendelyn Halter, MD   ketorolac  (TORADOL ) 30 MG/ML injection 30 mg, 30 mg, Intravenous, On Call to OR, Ladislao Cohenour, Sixto Duhamel, MD   lactated ringers  infusion, , Intravenous, Continuous, Ewell, Charles, MD   povidone-iodine 10 % swab 2 Application, 2 Application, Topical, Once, Wendelyn Halter, MD   scopolamine (TRANSDERM-SCOP) 1 MG/3DAYS 1.5 mg, 1 patch, Transdermal, Once, Ewell, Charles, MD, 1.5 mg at 05/28/23 1200   tranexamic acid (CYKLOKAPRON) IVPB 1,000 mg, 1,000 mg, Intravenous,  Once, Wendelyn Halter, MD  Review of Systems:   Review of Systems  Constitutional: Negative for fever, chills, weight loss, malaise/fatigue and diaphoresis.  HENT: Negative for hearing loss, ear pain, nosebleeds, congestion, sore throat, neck pain, tinnitus and ear discharge.   Eyes: Negative for blurred vision, double vision, photophobia, pain, discharge and redness.  Respiratory: Negative for cough, hemoptysis, sputum production, shortness of breath, wheezing and stridor.   Cardiovascular: Negative for chest pain, palpitations, orthopnea, claudication, leg swelling and PND.  Gastrointestinal: Positive for abdominal pain. Negative for heartburn, nausea, vomiting, diarrhea, constipation, blood in stool and melena.  Genitourinary: Negative for dysuria, urgency, frequency, hematuria and flank pain.  Musculoskeletal: Negative for myalgias, back pain, joint pain and falls.  Skin: Negative for itching and rash.  Neurological: Negative for dizziness, tingling, tremors, sensory change, speech change, focal weakness, seizures, loss of consciousness, weakness and headaches.  Endo/Heme/Allergies: Negative for environmental allergies and polydipsia. Does not bruise/bleed easily.  Psychiatric/Behavioral: Negative for depression, suicidal ideas, hallucinations, memory loss and substance abuse. The patient is not nervous/anxious and does not have insomnia.  PHYSICAL EXAM:  Blood pressure 116/84, pulse (!) 59, temperature 98 F (36.7 C), temperature source Oral, resp. rate 12, height 5\' 6"  (1.676 m), weight 61.7 kg, SpO2 100%, not currently breastfeeding.    Vitals reviewed. Constitutional: She is oriented to person, place, and time. She appears well-developed and well-nourished.  HENT:  Head: Normocephalic and atraumatic.  Right Ear: External ear normal.  Left Ear: External ear normal.  Nose: Nose normal.  Mouth/Throat: Oropharynx is clear and moist.  Eyes: Conjunctivae and EOM are normal.  Pupils are equal, round, and reactive to light. Right eye exhibits no discharge. Left eye exhibits no discharge. No scleral icterus.  Neck: Normal range of motion. Neck supple. No tracheal deviation present. No thyromegaly present.  Cardiovascular: Normal rate, regular rhythm, normal heart sounds and intact distal pulses.  Exam reveals no gallop and no friction rub.   No murmur heard. Respiratory: Effort normal and breath sounds normal. No respiratory distress. She has no wheezes. She has no rales. She exhibits no tenderness.  GI: Soft. Bowel sounds are normal. She exhibits no distension and no mass. There is tenderness. There is no rebound and no guarding.  Genitourinary:       Vulva is normal without lesions Vagina is pink moist without discharge Cervix normal in appearance and pap is normal Uterus is normal size, contour, position, consistency, mobility, non-tender Adnexa is negative with normal sized ovaries by sonogram  Musculoskeletal: Normal range of motion. She exhibits no edema and no tenderness.  Neurological: She is alert and oriented to person, place, and time. She has normal reflexes. She displays normal reflexes. No cranial nerve deficit. She exhibits normal muscle tone. Coordination normal.  Skin: Skin is warm and dry. No rash noted. No erythema. No pallor.  Psychiatric: She has a normal mood and affect. Her behavior is normal. Judgment and thought content normal.    Labs: Results for orders placed or performed during the hospital encounter of 05/23/23 (from the past 2 weeks)  Urinalysis, Routine w reflex microscopic -Urine, Clean Catch   Collection Time: 05/23/23  4:36 AM  Result Value Ref Range   Color, Urine RED (A) YELLOW   APPearance CLOUDY (A) CLEAR   Specific Gravity, Urine 1.025 1.005 - 1.030   pH 5.0 5.0 - 8.0   Glucose, UA 100 (A) NEGATIVE mg/dL   Hgb urine dipstick LARGE (A) NEGATIVE   Bilirubin Urine NEGATIVE NEGATIVE   Ketones, ur 15 (A) NEGATIVE mg/dL    Protein, ur >295 (A) NEGATIVE mg/dL   Nitrite POSITIVE (A) NEGATIVE   Leukocytes,Ua MODERATE (A) NEGATIVE   RBC / HPF >50 0 - 5 RBC/hpf   WBC, UA >50 0 - 5 WBC/hpf   Bacteria, UA MANY (A) NONE SEEN   Squamous Epithelial / HPF 0-5 0 - 5 /HPF   Mucus PRESENT   POC urine preg, ED   Collection Time: 05/23/23  4:40 AM  Result Value Ref Range   Preg Test, Ur Negative Negative  CBC   Collection Time: 05/23/23  5:00 AM  Result Value Ref Range   WBC 11.4 (H) 4.0 - 10.5 K/uL   RBC 3.88 3.87 - 5.11 MIL/uL   Hemoglobin 10.5 (L) 12.0 - 15.0 g/dL   HCT 62.1 (L) 30.8 - 65.7 %   MCV 86.9 80.0 - 100.0 fL   MCH 27.1 26.0 - 34.0 pg   MCHC 31.2 30.0 - 36.0 g/dL   RDW 84.6 96.2 - 95.2 %   Platelets 377 150 - 400  K/uL   nRBC 0.0 0.0 - 0.2 %  Type and screen [facility]   Collection Time: 05/23/23  5:00 AM  Result Value Ref Range   ABO/RH(D) O POS    Antibody Screen NEG    Sample Expiration      05/26/2023,2359 Performed at [facility], [address], [place], [place] [address]   GC/Chlamydia probe amp   Collection Time: 05/23/23  5:55 AM  Result Value Ref Range   Neisseria Gonorrhea Negative    Chlamydia Negative    Comment Normal Reference Ranger Chlamydia - Negative    Comment      Normal Reference Range Neisseria Gonorrhea - Negative  RPR   Collection Time: 05/23/23  6:28 AM  Result Value Ref Range   RPR Ser Ql NON REACTIVE NON REACTIVE  HIV Antibody (routine testing w rflx)   Collection Time: 05/23/23  6:28 AM  Result Value Ref Range   HIV Screen 4th Generation wRfx Non Reactive Non Reactive  Wet prep, genital   Collection Time: 05/23/23  6:35 AM   Specimen: PATH Cytology Cervicovaginal Ancillary Only  Result Value Ref Range   Yeast Wet Prep HPF POC NONE SEEN NONE SEEN   Trich, Wet Prep NONE SEEN NONE SEEN   Clue Cells Wet Prep HPF POC PRESENT (A) NONE SEEN   WBC, Wet Prep HPF POC MANY (A) <10   Sperm NONE SEEN     EKG: Orders placed or performed during the  hospital encounter of 10/10/22   ED EKG   ED EKG   EKG 12-Lead   EKG 12-Lead   EKG   EKG    Imaging Studies: No results found.    Assessment: Incomplete Ab  Plan: Suction/sharp D&C  Wendelyn Halter 05/28/2023 12:18 PM

## 2023-05-29 ENCOUNTER — Encounter (HOSPITAL_COMMUNITY): Payer: Self-pay | Admitting: Obstetrics & Gynecology

## 2023-05-29 LAB — SURGICAL PATHOLOGY

## 2023-06-10 ENCOUNTER — Telehealth: Admitting: Obstetrics & Gynecology

## 2023-06-10 ENCOUNTER — Encounter: Payer: Self-pay | Admitting: Obstetrics & Gynecology

## 2023-06-10 DIAGNOSIS — Z9889 Other specified postprocedural states: Secondary | ICD-10-CM

## 2023-06-10 DIAGNOSIS — B9689 Other specified bacterial agents as the cause of diseases classified elsewhere: Secondary | ICD-10-CM

## 2023-06-10 DIAGNOSIS — N760 Acute vaginitis: Secondary | ICD-10-CM

## 2023-06-10 DIAGNOSIS — Z4889 Encounter for other specified surgical aftercare: Secondary | ICD-10-CM

## 2023-06-10 MED ORDER — NUVESSA 1.3 % VA GEL: 1.0000 | Freq: Once | VAGINAL | 1 refills | Status: AC

## 2023-06-10 NOTE — Progress Notes (Signed)
 My Chart video + audio visit Patient is at home I am in my office Total time: 10 minutes   HPI: Patient returns for routine postoperative follow-up having undergone D&E 06/03/23 .  The patient's immediate postoperative recovery has been unremarkable. Since hospital discharge the patient reports no rpbolems, did have GI side effects from doxycycline .   Current Outpatient Medications: NUVESSA  1.3 % GEL, Place 1 applicator vaginally once for 1 dose., Disp: 5 g, Rfl: 1 acetaminophen  (TYLENOL ) 500 MG tablet, Take 1,000 mg by mouth every 6 (six) hours as needed for moderate pain (pain score 4-6). (Patient not taking: Reported on 06/10/2023), Disp: , Rfl:  cetirizine (ZYRTEC) 10 MG tablet, Take 10 mg by mouth daily as needed for allergies. (Patient not taking: Reported on 06/10/2023), Disp: , Rfl:  doxycycline  (VIBRA -TABS) 100 MG tablet, Take 1 tablet (100 mg total) by mouth 2 (two) times daily. (Patient not taking: Reported on 06/10/2023), Disp: 20 tablet, Rfl: 0 ketorolac  (TORADOL ) 10 MG tablet, Take 1 tablet (10 mg total) by mouth every 8 (eight) hours as needed. (Patient not taking: Reported on 06/10/2023), Disp: 15 tablet, Rfl: 0 misoprostol  (CYTOTEC ) 200 MCG tablet, Take 2 tablets (400 mcg total) by mouth 3 (three) times daily for 3 days., Disp: 18 tablet, Rfl: 0 ondansetron  (ZOFRAN -ODT) 8 MG disintegrating tablet, Take 1 tablet (8 mg total) by mouth every 8 (eight) hours as needed for nausea or vomiting. (Patient not taking: Reported on 06/10/2023), Disp: 8 tablet, Rfl: 0  No current facility-administered medications for this visit.    not currently breastfeeding.  Physical Exam: GEN WDWN NAD  Diagnostic Tests:   Pathology: Benign POC  Impression + Management plan:   ICD-10-CM   1. Post-operative state: S/P D&E for incomplete Ab in first trimester  Z98.890    wants the Mirena IUD for [facility], schedule for 4 weeks or so    2. BV (bacterial vaginosis): Rx nuvessa  sent  N76.0    B96.89             Medications Prescribed this encounter: Orders Placed This Encounter     NUVESSA  1.3 % GEL         Sig: Place 1 applicator vaginally once for 1 dose.         Dispense:  5 g         Refill:  1     Follow up: Return in about 4 weeks (around 07/08/2023) for Mirena IUD insertion.    Wendelyn Halter, MD Attending Physician for the Center for [facility] and [facility] 06/10/2023 11:58 AM

## 2023-06-24 ENCOUNTER — Encounter: Admitting: Obstetrics & Gynecology

## 2023-06-29 DIAGNOSIS — A599 Trichomoniasis, unspecified: Secondary | ICD-10-CM

## 2023-06-29 HISTORY — DX: Trichomoniasis, unspecified: A59.9

## 2023-07-11 ENCOUNTER — Other Ambulatory Visit (HOSPITAL_COMMUNITY)
Admission: RE | Admit: 2023-07-11 | Discharge: 2023-07-11 | Disposition: A | Discharge status: Home / Self Care | Source: Ambulatory Visit | Attending: Adult Health | Admitting: Adult Health

## 2023-07-11 ENCOUNTER — Encounter: Payer: Self-pay | Admitting: Adult Health

## 2023-07-11 ENCOUNTER — Ambulatory Visit: Admitting: Adult Health

## 2023-07-11 VITALS — BP 139/79 | HR 69 | Ht 66.0 in | Wt 140.0 lb

## 2023-07-11 DIAGNOSIS — Z3202 Encounter for pregnancy test, result negative: Secondary | ICD-10-CM | POA: Diagnosis not present

## 2023-07-11 DIAGNOSIS — Z124 Encounter for screening for malignant neoplasm of cervix: Secondary | ICD-10-CM | POA: Insufficient documentation

## 2023-07-11 DIAGNOSIS — Z30430 Encounter for insertion of intrauterine contraceptive device: Secondary | ICD-10-CM | POA: Diagnosis not present

## 2023-07-11 DIAGNOSIS — R030 Elevated blood-pressure reading, without diagnosis of hypertension: Secondary | ICD-10-CM | POA: Insufficient documentation

## 2023-07-11 LAB — POCT URINE PREGNANCY: Preg Test, Ur: NEGATIVE

## 2023-07-11 MED ORDER — LEVONORGESTREL 20 MCG/DAY IU IUD
1.0000 | INTRAUTERINE_SYSTEM | Freq: Once | INTRAUTERINE | Status: AC
Start: 2023-07-11 — End: 2023-07-11
  Administered 2023-07-11: 1 via INTRAUTERINE

## 2023-07-11 NOTE — Progress Notes (Signed)
 Patient ID: Leamon Pringle, female   DOB: September 03, 2000, 23 y.o.   MRN: [id number]   IUD INSERTION Patient name: Sarah Campbell MRN [id number]  Date of birth: [birth year] Subjective Findings:   Sarah Campbell is a 23 y.o. [phone] Caucasian female being seen today for insertion of a Mirena IUD.  Patient's last menstrual period was 07/01/2023. Last sexual intercourse was 3 weeks ago Last pap, performed today before IUD inserted   The risks and benefits of the method and placement have been thouroughly reviewed with the patient and all questions were answered.  Specifically the patient is aware of failure rate of 01/998, expulsion of the IUD and of possible perforation.  The patient is aware of irregular bleeding due to the method and understands the incidence of irregular bleeding diminishes with time.  Signed copy of informed consent in chart.      09/20/2021    9:36 AM 06/18/2021    2:30 PM 05/30/2020   10:43 AM 12/31/2017    1:40 PM 10/15/2017   12:01 PM  Depression screen PHQ 2/9  Decreased Interest 0 0 0 0 0  Down, Depressed, Hopeless 0 0 0 0 0  PHQ - 2 Score 0 0 0 0 0  Altered sleeping 0 2 1    Tired, decreased energy 0 0 1    Change in appetite 0 0 0    Feeling bad or failure about yourself  0 0 0    Trouble concentrating 0 0 0    Moving slowly or fidgety/restless 0 0 0    Suicidal thoughts 0 0 0    PHQ-9 Score 0 2 2          09/20/2021    9:37 AM 06/18/2021    2:30 PM 05/30/2020   10:43 AM  GAD 7 : Generalized Anxiety Score  Nervous, Anxious, on Edge 0 0 0  Control/stop worrying 0 0 0  Worry too much - different things 0 0 0  Trouble relaxing 0 0 1  Restless 0 0 0  Easily annoyed or irritable 0 0 0  Afraid - awful might happen 0 0 0  Total GAD 7 Score 0 0 1     Pertinent History Reviewed:   Reviewed past medical,surgical, social, obstetrical and family history.  Reviewed problem list, medications and allergies. Objective Findings & Procedure:   Vitals:   07/11/23 1137  07/11/23 1143  BP: (!) 129/93 139/79  Pulse: 70 69  Weight: 140 lb (63.5 kg)   Height: 5' 6 (1.676 m)   Body mass index is 22.6 kg/m.  Results for orders placed or performed in visit on 07/11/23 (from the past 24 hours)  POCT urine pregnancy   Collection Time: 07/11/23 12:07 PM  Result Value Ref Range   Preg Test, Ur Negative Negative     Time out was performed.  A Graves speculum was placed in the vagina.  The cervix was visualized,pap with GC/CHL performed,cervix prepped using Betadine , and grasped with a single tooth tenaculum. The uterus was found to be neutral and it sounded to 7 cm.  Mirena  IUD placed per manufacturer's recommendations. The strings were trimmed to approximately 2-3 cm. The patient tolerated the procedure well.   Informal abdominal sonogram was performed and the proper placement of the IUD was verified by me and [facility].   Chaperone: Laverne Potter RN  Assessment & Plan:   1. Negative pregnancy test - POCT urine pregnancy  2. Encounter for IUD insertion (  Primary) Mirena inserted  - levonorgestrel (MIRENA) 20 MCG/DAY IUD 1 each - POCT urine pregnancy  3. Routine Papanicolaou smear Pap sent Pap in 3 years if normal - Cytology - PAP  4. Elevated blood-pressure reading without diagnosis of hypertension Keep check on BP and recheck in 4 weeks at IUD check    The patient was given post procedure instructions, including signs and symptoms of infection and to check for the strings after each menses or each month, and refraining from intercourse or anything in the vagina for 3 days. She was given a care card with date IUD placed, and date IUD to be removed. She is scheduled for a f/u appointment in 4 weeks.  Orders Placed This Encounter  Procedures   POCT urine pregnancy    Return in about 4 weeks (around 08/08/2023) for IUD check with me .  Lendia Quay NP 07/11/2023 12:15 PM

## 2023-07-17 ENCOUNTER — Ambulatory Visit: Payer: Self-pay | Admitting: Adult Health

## 2023-07-17 DIAGNOSIS — A599 Trichomoniasis, unspecified: Secondary | ICD-10-CM | POA: Insufficient documentation

## 2023-07-17 LAB — CYTOLOGY - PAP
Chlamydia: NEGATIVE
Comment: NEGATIVE
Comment: NEGATIVE
Comment: NORMAL
Diagnosis: NEGATIVE
Neisseria Gonorrhea: NEGATIVE
Trichomonas: POSITIVE — AB

## 2023-07-17 MED ORDER — METRONIDAZOLE 500 MG PO TABS: 500.0000 mg | ORAL_TABLET | Freq: Two times a day (BID) | ORAL | 0 refills | Status: DC

## 2023-08-08 ENCOUNTER — Ambulatory Visit: Admitting: Adult Health

## 2023-08-08 ENCOUNTER — Other Ambulatory Visit (HOSPITAL_COMMUNITY)
Admission: RE | Admit: 2023-08-08 | Discharge: 2023-08-08 | Disposition: A | Discharge status: Home / Self Care | Source: Ambulatory Visit | Attending: Adult Health | Admitting: Adult Health

## 2023-08-08 ENCOUNTER — Encounter: Payer: Self-pay | Admitting: Adult Health

## 2023-08-08 VITALS — BP 122/73 | HR 59 | Ht 66.0 in | Wt 142.5 lb

## 2023-08-08 DIAGNOSIS — Z113 Encounter for screening for infections with a predominantly sexual mode of transmission: Secondary | ICD-10-CM | POA: Insufficient documentation

## 2023-08-08 DIAGNOSIS — Z8619 Personal history of other infectious and parasitic diseases: Secondary | ICD-10-CM | POA: Diagnosis not present

## 2023-08-08 DIAGNOSIS — Z09 Encounter for follow-up examination after completed treatment for conditions other than malignant neoplasm: Secondary | ICD-10-CM | POA: Insufficient documentation

## 2023-08-08 DIAGNOSIS — Z30431 Encounter for routine checking of intrauterine contraceptive device: Secondary | ICD-10-CM | POA: Insufficient documentation

## 2023-08-08 DIAGNOSIS — R030 Elevated blood-pressure reading, without diagnosis of hypertension: Secondary | ICD-10-CM

## 2023-08-08 NOTE — Progress Notes (Signed)
  Subjective:     Patient ID: Sarah Campbell, female   DOB: [birth year], 23 y.o.   MRN: [id number]  HPI Sarah Campbell is a 23 year old white female,single, [id number] in for IUD check and POT for trich.     Component Value Date/Time   DIAGPAP  07/11/2023 1214    - Negative for intraepithelial lesion or malignancy (NILM)   DIAGPAP  05/30/2020 1155    - Negative for intraepithelial lesion or malignancy (NILM)   ADEQPAP  07/11/2023 1214    Satisfactory for evaluation; transformation zone component PRESENT.   ADEQPAP  05/30/2020 1155    Satisfactory for evaluation; transformation zone component PRESENT.    Review of Systems Has not had sex IUD inserted, took meds for trich Spotting some Reviewed past medical,surgical, social and family history. Reviewed medications and allergies.     Objective:   Physical Exam BP 122/73 (BP Location: Right Arm, Patient Position: Sitting, Cuff Size: Normal)   Pulse (!) 59   Ht 5' 6 (1.676 m)   Wt 142 lb 8 oz (64.6 kg)   LMP 07/26/2023   Breastfeeding No   BMI 23.00 kg/m     Skin warm and dry.Pelvic: external genitalia is normal in appearance no lesions, vagina: pink, scant tan discharge, no odor,urethra has no lesions or masses noted, cervix:smooth and bulbous, +IUD strings at os, uterus: normal size, shape and contour, non tender, no masses felt, adnexa: no masses or tenderness noted. Bladder is non tender and no masses felt.  Examination chaperoned by Clarita Salt LPN   [place] - 08/08/23 0910       Pregnancy Intention Screening   Does the patient want to become pregnant in the next year? No    Does the patient's partner want to become pregnant in the next year? No    Would the patient like to discuss contraceptive options today? No      Contraception Wrap Up   Current Method IUD or IUS    End Method IUD or IUS    Contraception Counseling Provided Yes          Assessment:     1. Follow-up exam after treatment CV swab sent for trich -  Cervicovaginal ancillary only( [place])  2. Screening for STD (sexually transmitted disease) CV swab sent for Trich  - Cervicovaginal ancillary only( [place])  3. History of trichomoniasis Pt took meds, no sex since CV swab sent for POT  - Cervicovaginal ancillary only( [place])  4. IUD check up (Primary) +IUD strings at os Mirena  placed 07/11/23  5. Elevated blood-pressure reading without diagnosis of hypertension Keep check on BP      Plan:     Return in 6 months for physical

## 2023-08-11 LAB — CERVICOVAGINAL ANCILLARY ONLY
Comment: NEGATIVE
Trichomonas: NEGATIVE

## 2023-08-12 ENCOUNTER — Ambulatory Visit: Payer: Self-pay | Admitting: Adult Health

## 2023-10-17 ENCOUNTER — Ambulatory Visit: Admitting: *Deleted

## 2023-10-17 DIAGNOSIS — R300 Dysuria: Secondary | ICD-10-CM

## 2023-10-17 DIAGNOSIS — R350 Frequency of micturition: Secondary | ICD-10-CM

## 2023-10-17 LAB — POCT URINALYSIS DIPSTICK OB
Blood, UA: NEGATIVE
Glucose, UA: NEGATIVE
Ketones, UA: NEGATIVE
Nitrite, UA: NEGATIVE

## 2023-10-17 NOTE — Progress Notes (Signed)
   NURSE VISIT- UTI SYMPTOMS   SUBJECTIVE:  Sarah Campbell is a 23 y.o. [phone] female here for UTI symptoms. She is a GYN patient. She reports dysuria and urinary frequency.  OBJECTIVE:  There were no vitals taken for this visit.  Appears well, in no apparent distress  No results found for this or any previous visit (from the past 24 hours).  ASSESSMENT: GYN patient with UTI symptoms and negative nitrites  PLAN: Note routed to Delon Lewis, AGNP   Rx sent by provider today: No Urine culture sent Call or return to clinic prn if these symptoms worsen or fail to improve as anticipated. Follow-up: as needed   Alan LITTIE Fischer  10/17/2023 10:33 AM

## 2023-10-18 LAB — URINALYSIS
Bilirubin, UA: NEGATIVE
Glucose, UA: NEGATIVE
Ketones, UA: NEGATIVE
Nitrite, UA: NEGATIVE
RBC, UA: NEGATIVE
Specific Gravity, UA: 1.027 (ref 1.005–1.030)
Urobilinogen, Ur: 0.2 mg/dL (ref 0.2–1.0)
pH, UA: 5.5 (ref 5.0–7.5)

## 2023-10-19 LAB — URINE CULTURE

## 2023-10-20 ENCOUNTER — Ambulatory Visit: Payer: Self-pay | Admitting: Adult Health
